# Patient Record
Sex: Female | Born: 1940 | Race: White | Hispanic: No | Smoking: Former smoker
Health system: Southern US, Community
[De-identification: ages and names within clinical notes are randomized; demographics above are authoritative.]

## PROBLEM LIST (undated history)

## (undated) DIAGNOSIS — B029 Zoster without complications: Secondary | ICD-10-CM

## (undated) DIAGNOSIS — Z0000 Encounter for general adult medical examination without abnormal findings: Secondary | ICD-10-CM

## (undated) DIAGNOSIS — N8110 Cystocele, unspecified: Secondary | ICD-10-CM

## (undated) DIAGNOSIS — B019 Varicella without complication: Secondary | ICD-10-CM

## (undated) DIAGNOSIS — Z974 Presence of external hearing-aid: Secondary | ICD-10-CM

## (undated) DIAGNOSIS — E785 Hyperlipidemia, unspecified: Secondary | ICD-10-CM

## (undated) DIAGNOSIS — M1990 Unspecified osteoarthritis, unspecified site: Secondary | ICD-10-CM

## (undated) DIAGNOSIS — R600 Localized edema: Secondary | ICD-10-CM

## (undated) DIAGNOSIS — I10 Essential (primary) hypertension: Secondary | ICD-10-CM

## (undated) DIAGNOSIS — N760 Acute vaginitis: Secondary | ICD-10-CM

## (undated) DIAGNOSIS — J40 Bronchitis, not specified as acute or chronic: Secondary | ICD-10-CM

## (undated) DIAGNOSIS — Z8701 Personal history of pneumonia (recurrent): Secondary | ICD-10-CM

## (undated) DIAGNOSIS — R05 Cough: Secondary | ICD-10-CM

## (undated) HISTORY — DX: Varicella without complication: B01.9

## (undated) HISTORY — DX: Zoster without complications: B02.9

## (undated) HISTORY — DX: Encounter for general adult medical examination without abnormal findings: Z00.00

## (undated) HISTORY — DX: Presence of external hearing-aid: Z97.4

## (undated) HISTORY — DX: Unspecified osteoarthritis, unspecified site: M19.90

## (undated) HISTORY — DX: Bronchitis, not specified as acute or chronic: J40

## (undated) HISTORY — PX: TUBAL LIGATION: SHX77

## (undated) HISTORY — DX: Acute vaginitis: N76.0

## (undated) HISTORY — PX: OTHER SURGICAL HISTORY: SHX169

## (undated) HISTORY — DX: Localized edema: R60.0

## (undated) HISTORY — DX: Personal history of pneumonia (recurrent): Z87.01

## (undated) HISTORY — DX: Essential (primary) hypertension: I10

## (undated) HISTORY — DX: Hyperlipidemia, unspecified: E78.5

## (undated) HISTORY — DX: Cystocele, unspecified: N81.10

## (undated) HISTORY — DX: Cough: R05

## (undated) HISTORY — PX: BREAST BIOPSY: SHX20

---

## 2005-11-29 IMAGING — MG MM-DIGITAL SCREENING MAMMO
1 series · 4 of 4 positions shown · non-contrast
Comparison: none

EXAM: CERVICAL SPINE

IN-PAIN Comment to Radiology:....
No acute fracture is seen. There is narrowing of the C3-C4, C4-C5 and
C5-C6 cervical disk spaces compatible with cervical disk disease.
Oblique view shows slight spur impingement on the neural foramina at
these levels. The odontoid process is intact. No cervical rib formation
is seen.

[L CC · left · 4 of 4 slices shown]
[im 1/4]
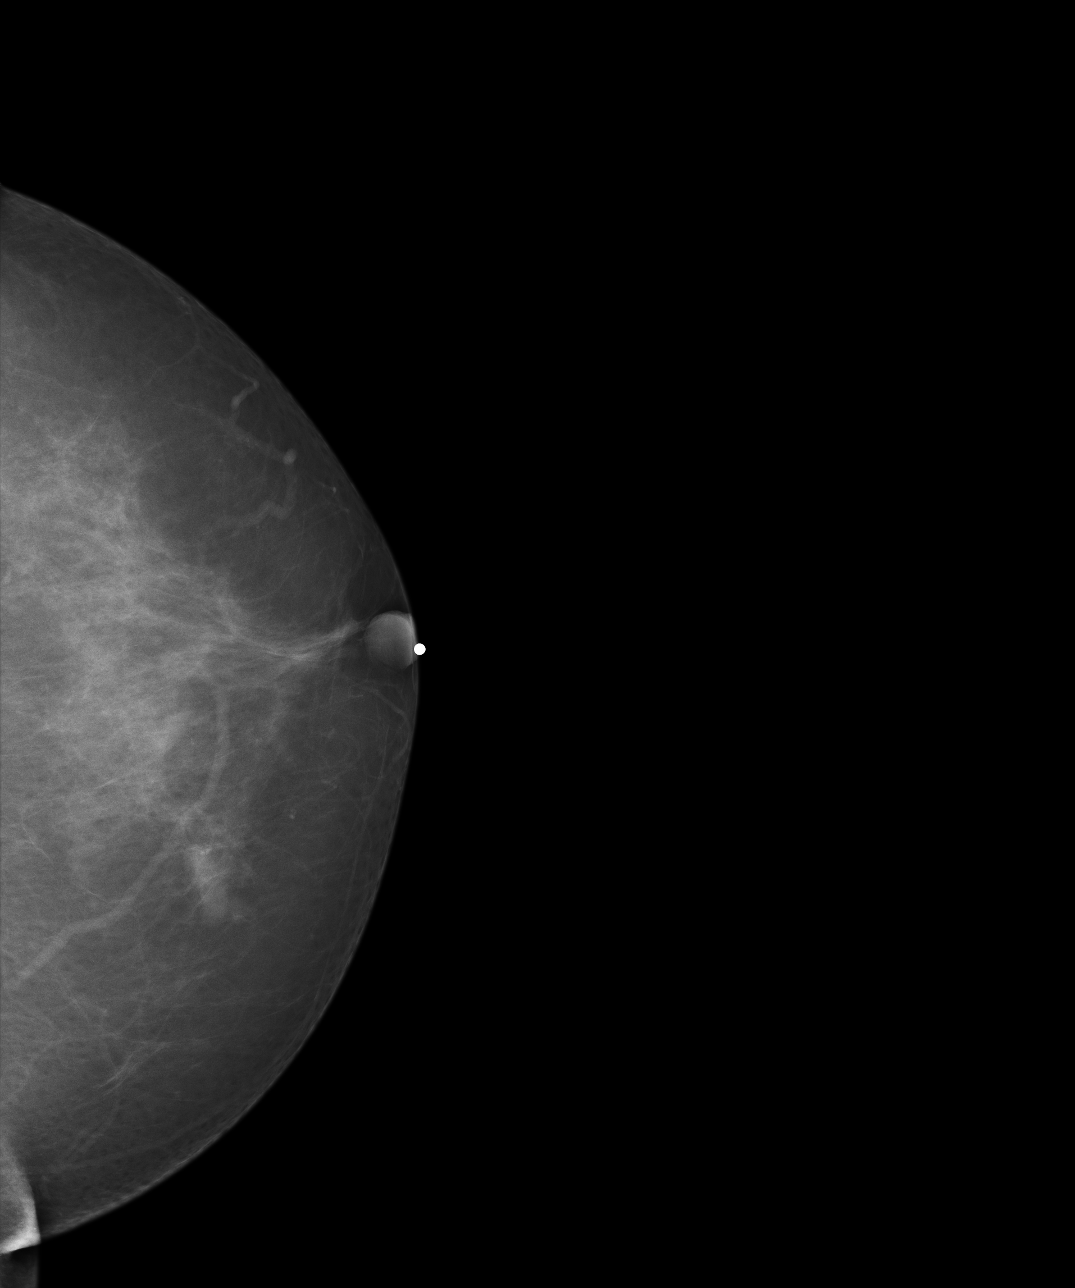
[im 2/4]
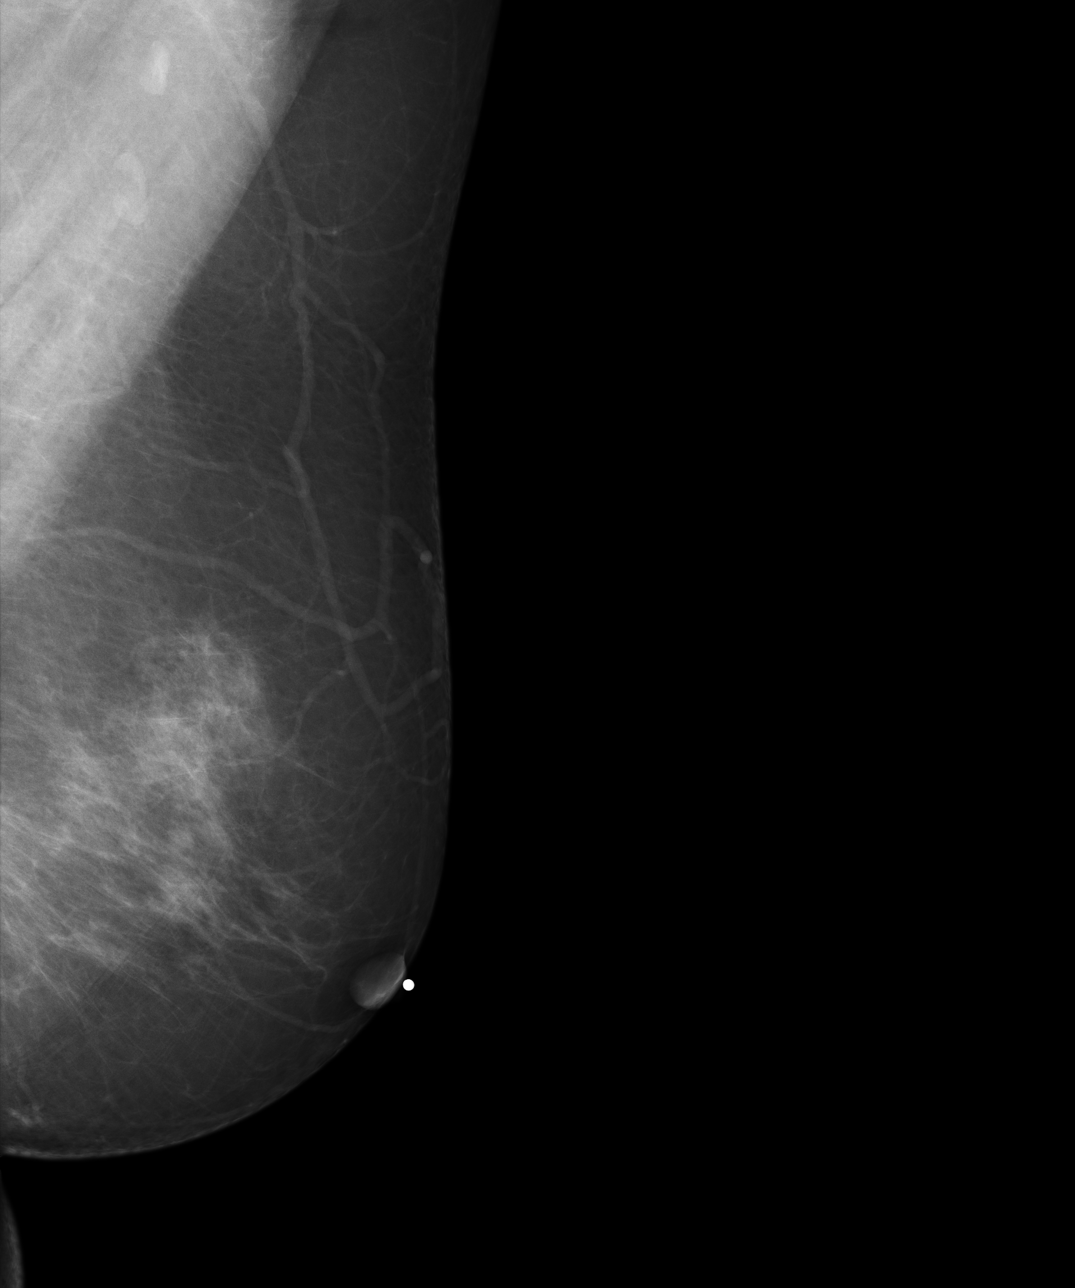
[im 3/4]
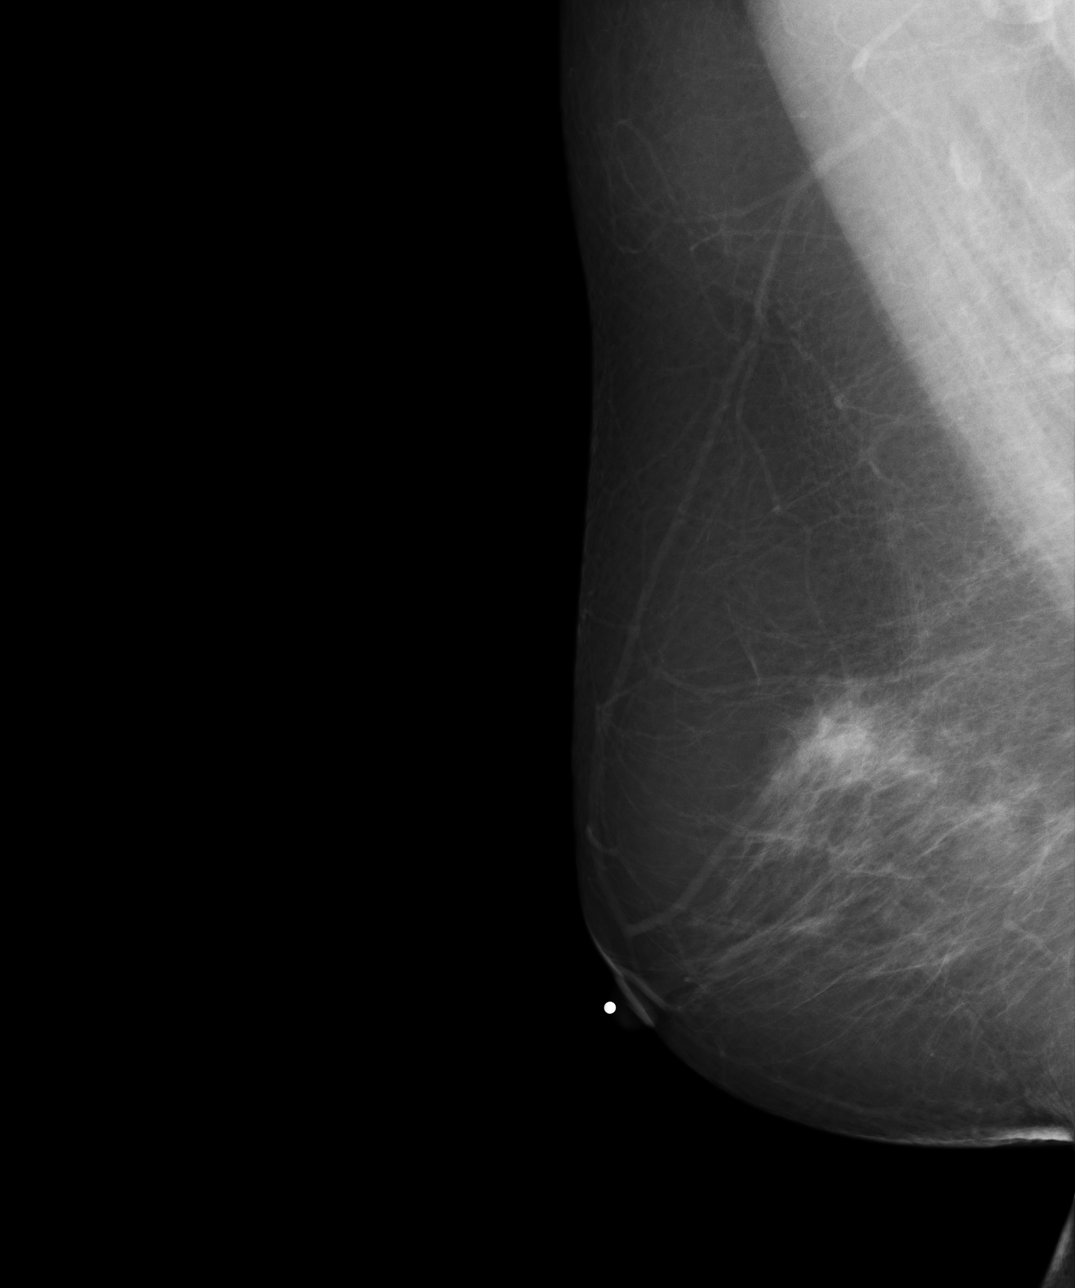
[im 4/4]
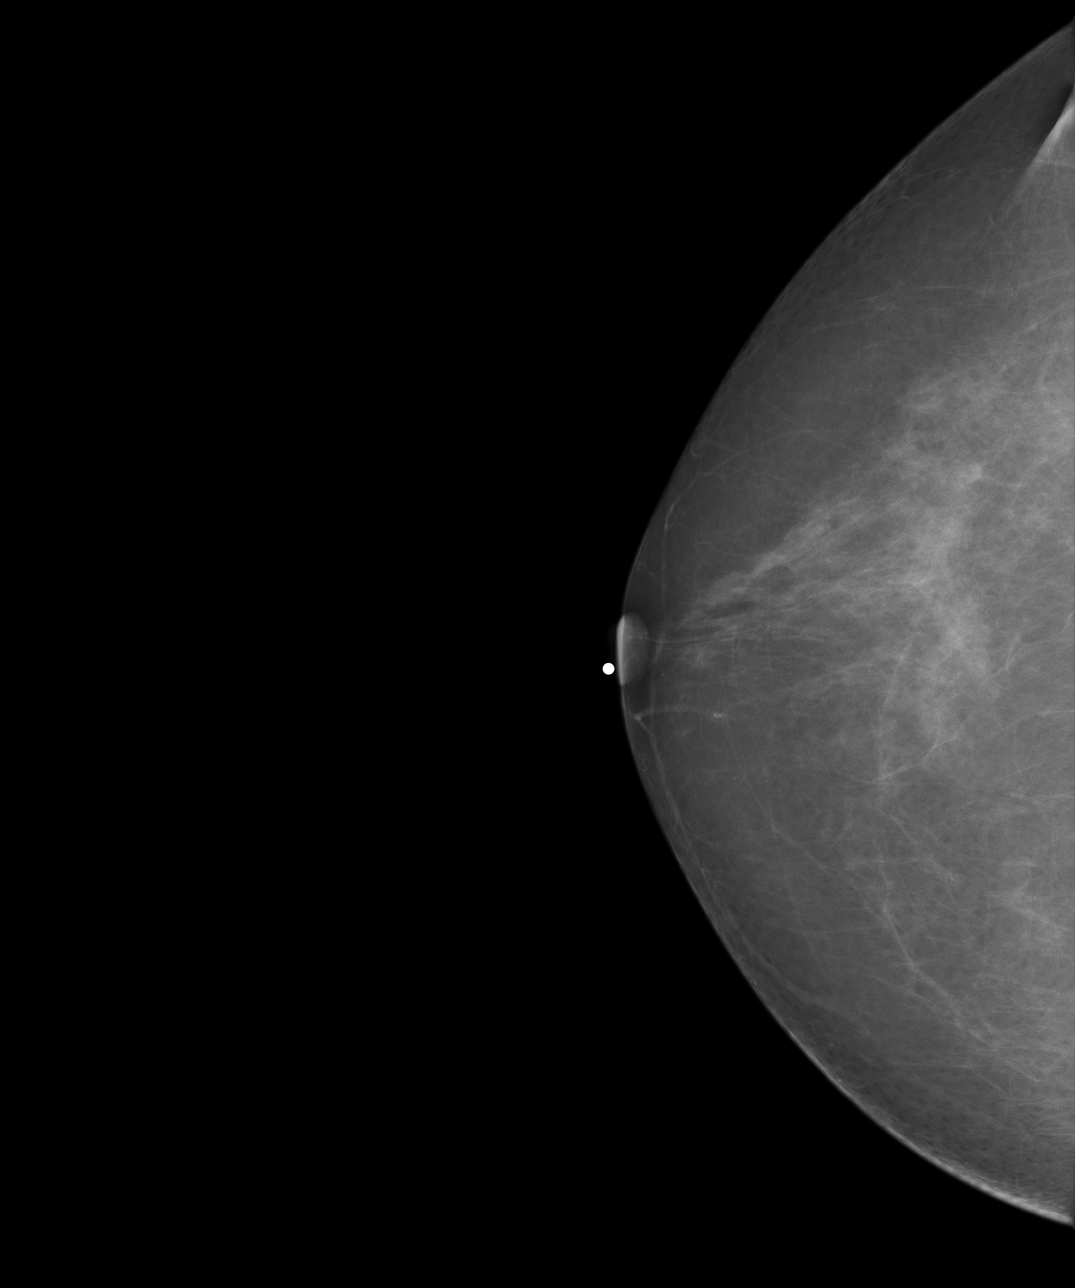

[4 of 4 positions shown; findings below may reference images not displayed]

CONCLUSION: 1. No fracture is identified.
2. There are changes of cervical disk disease at multiple levels as
 described above.

## 2005-12-09 IMAGING — MG MM-DIGITAL DIAG MAMMO UNILAT LEFT
1 series · 3 of 3 positions shown · non-contrast
Comparison: none

NAME: MOOLMAN

EXAM: CHEST ROUTINE
REASON: Last Menstrual Date:..... POST Reason for Consultation:.
CHOKED Comment to Radiology:....

[L LM · left · 3 of 3 slices shown]
[im 1/3]
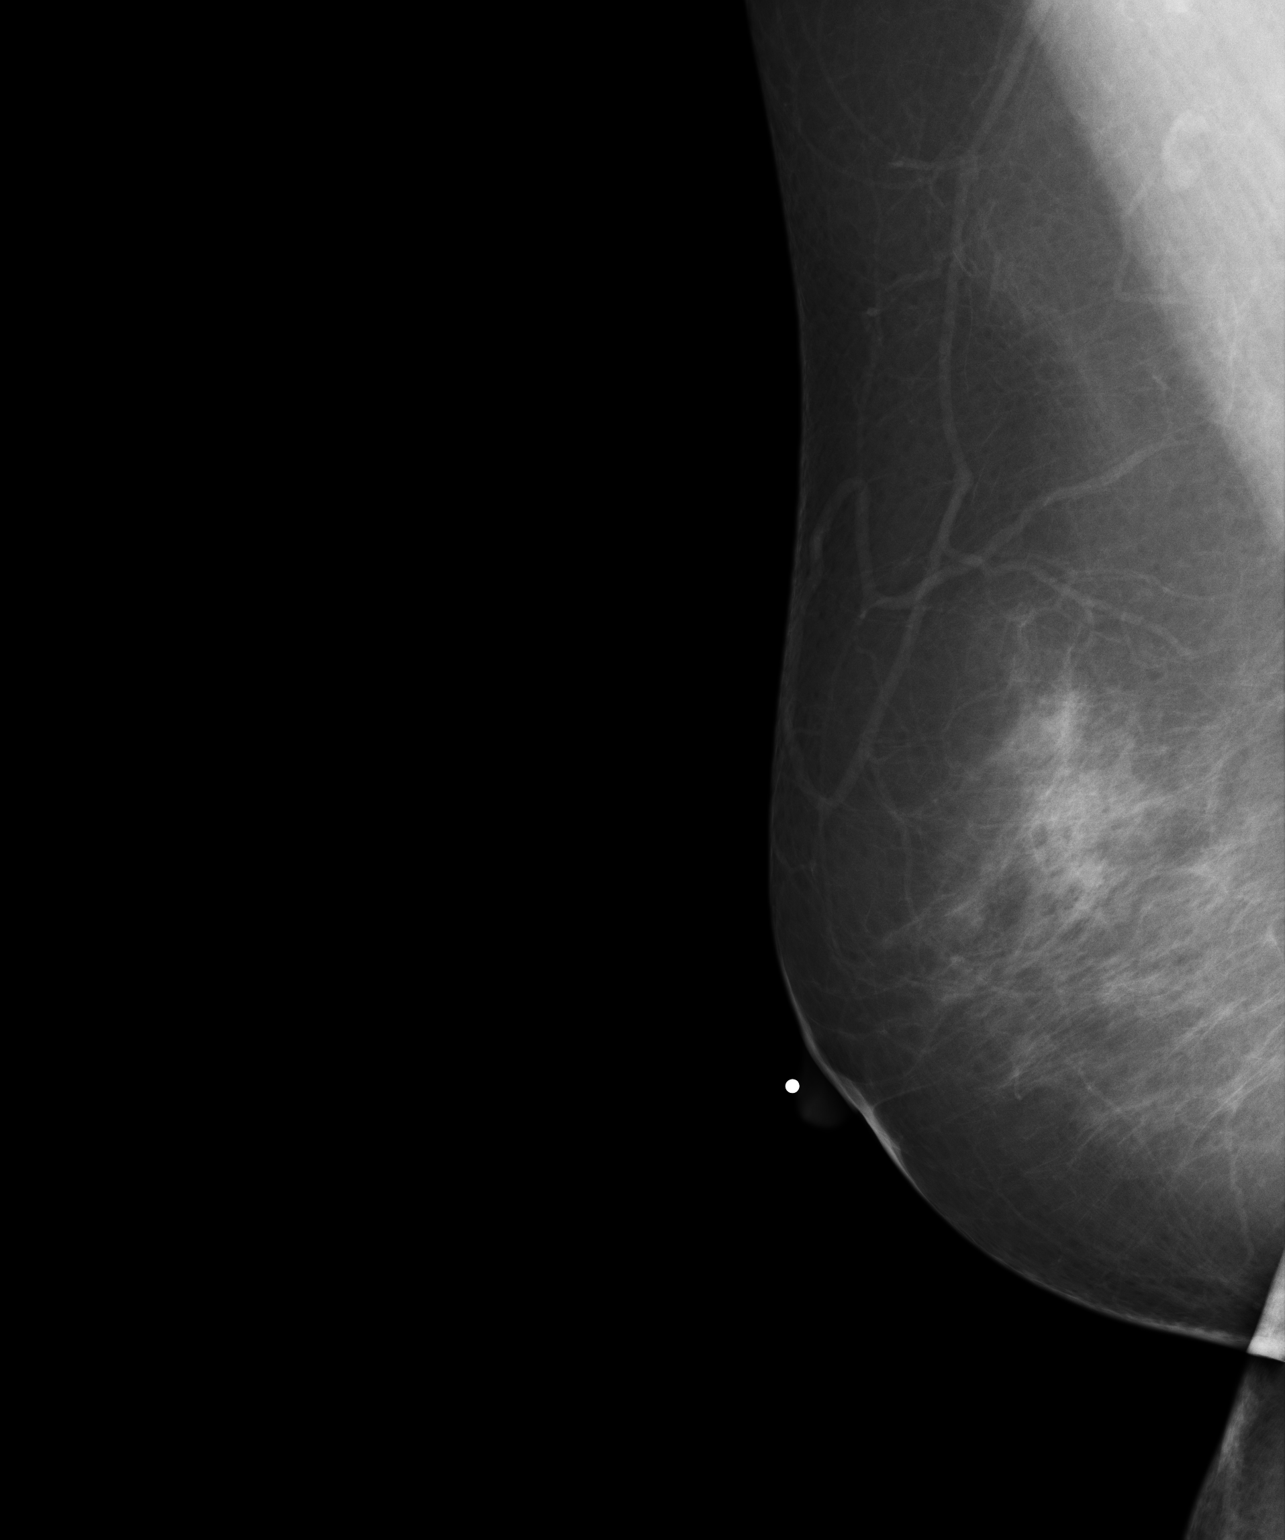
[im 2/3]
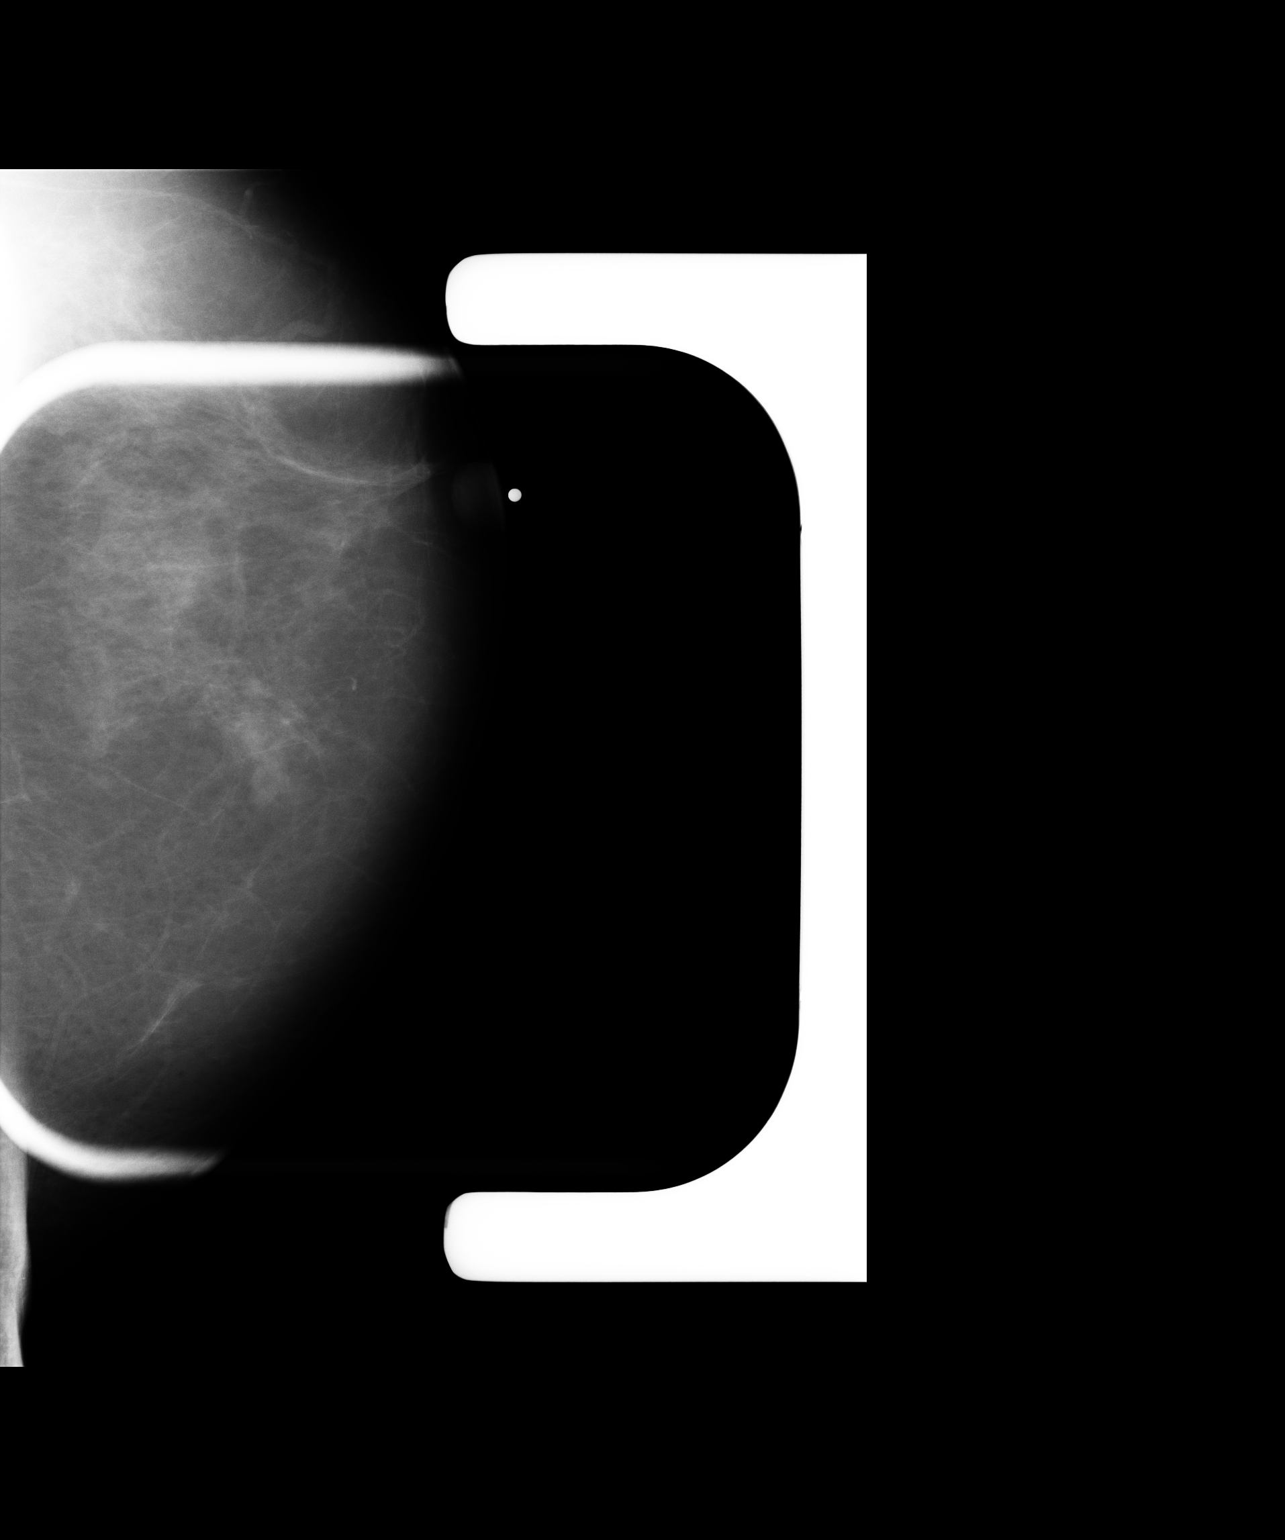
[im 3/3]
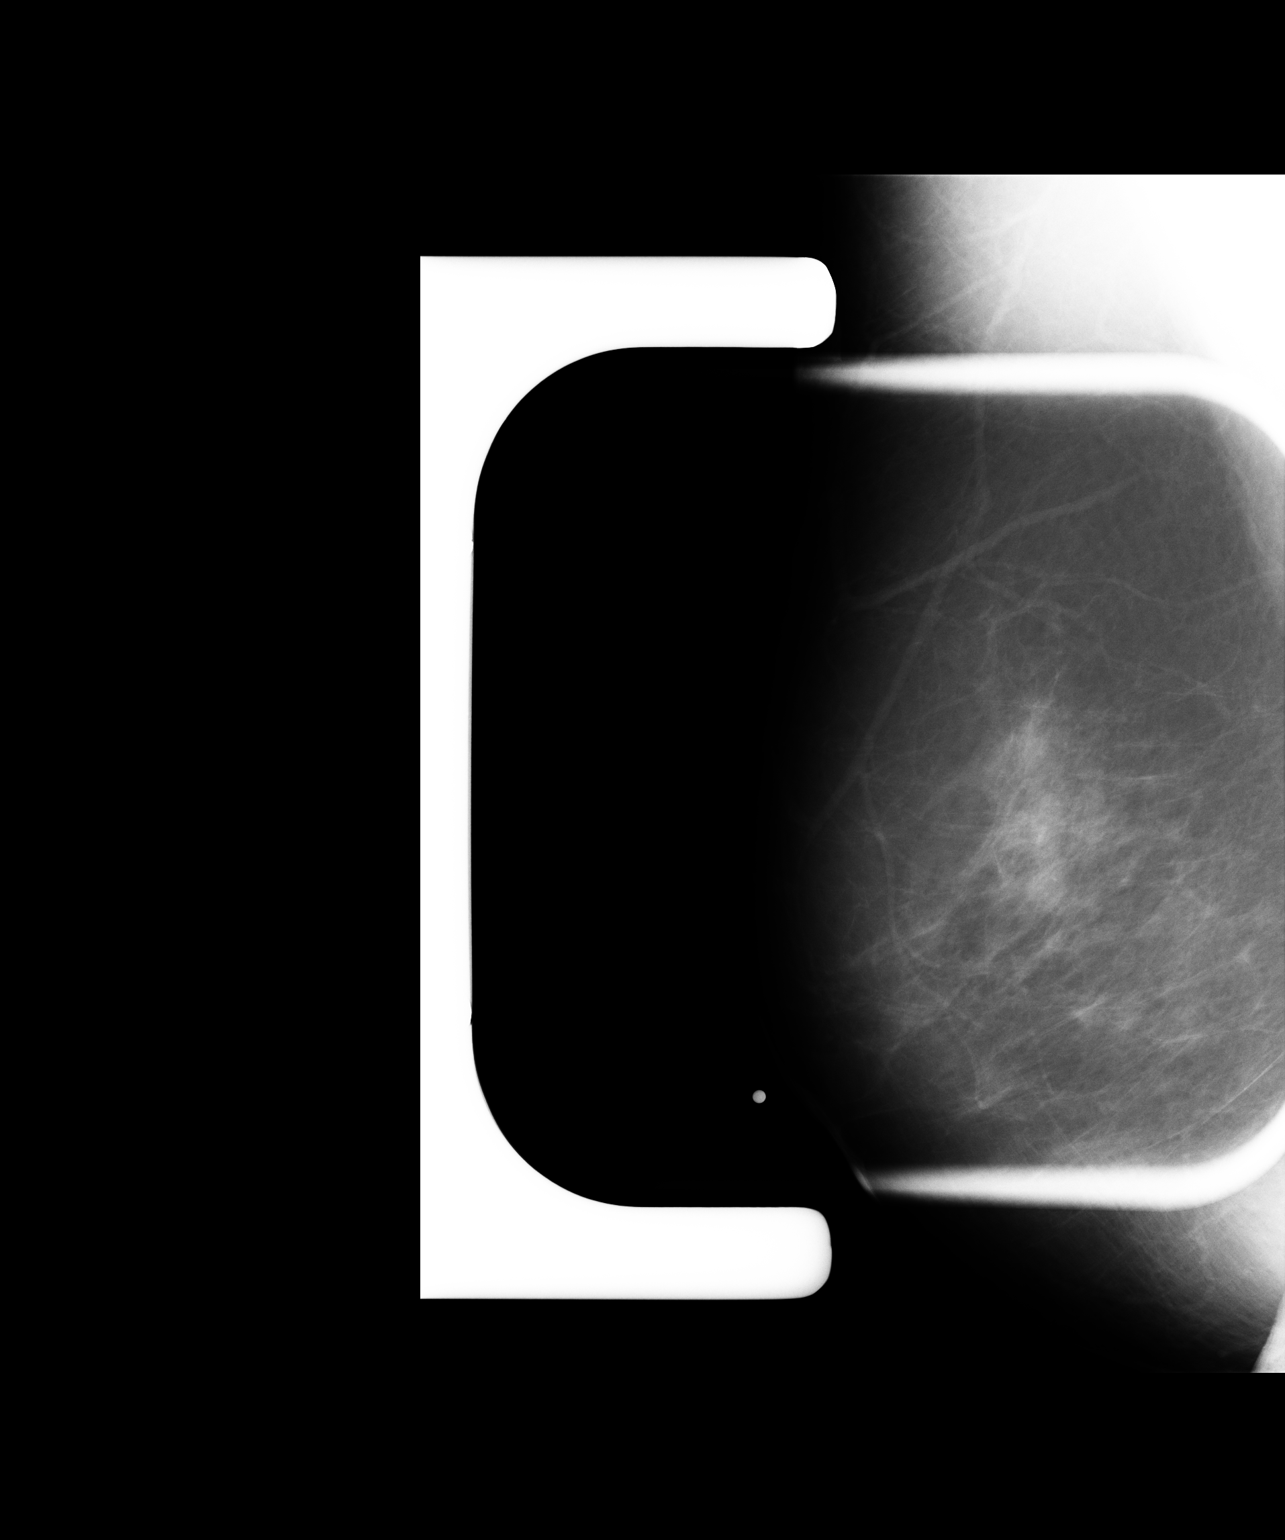

[3 of 3 positions shown; findings below may reference images not displayed]

FINDINGS: Comparison is made to a portable study of the chest on [DATE].

The heart and pulmonary vessels appear to be within normal limits.
There is no infiltrate or effusion. There is some minimal thickening of
the major fissure on the lateral view. No pneumothorax is present.
IMPRESSION: No acute cardiopulmonary disease. No interval change.

## 2006-12-01 IMAGING — MG MM-DIGITAL SCREENING MAMMO
1 series · 4 of 4 positions shown · non-contrast
Comparison: none

EXAM: SCREENING MAMMOGRAM BILAT
VER. RAD:PAC[DATE] [DATE]<

REASON: KCW/MAMMO....HM 226 [NP]
HISTORY: Routine, previous breast biopsy history.

[Series 569: L CC · left · 4 of 4 slices shown]
[im 1/4]
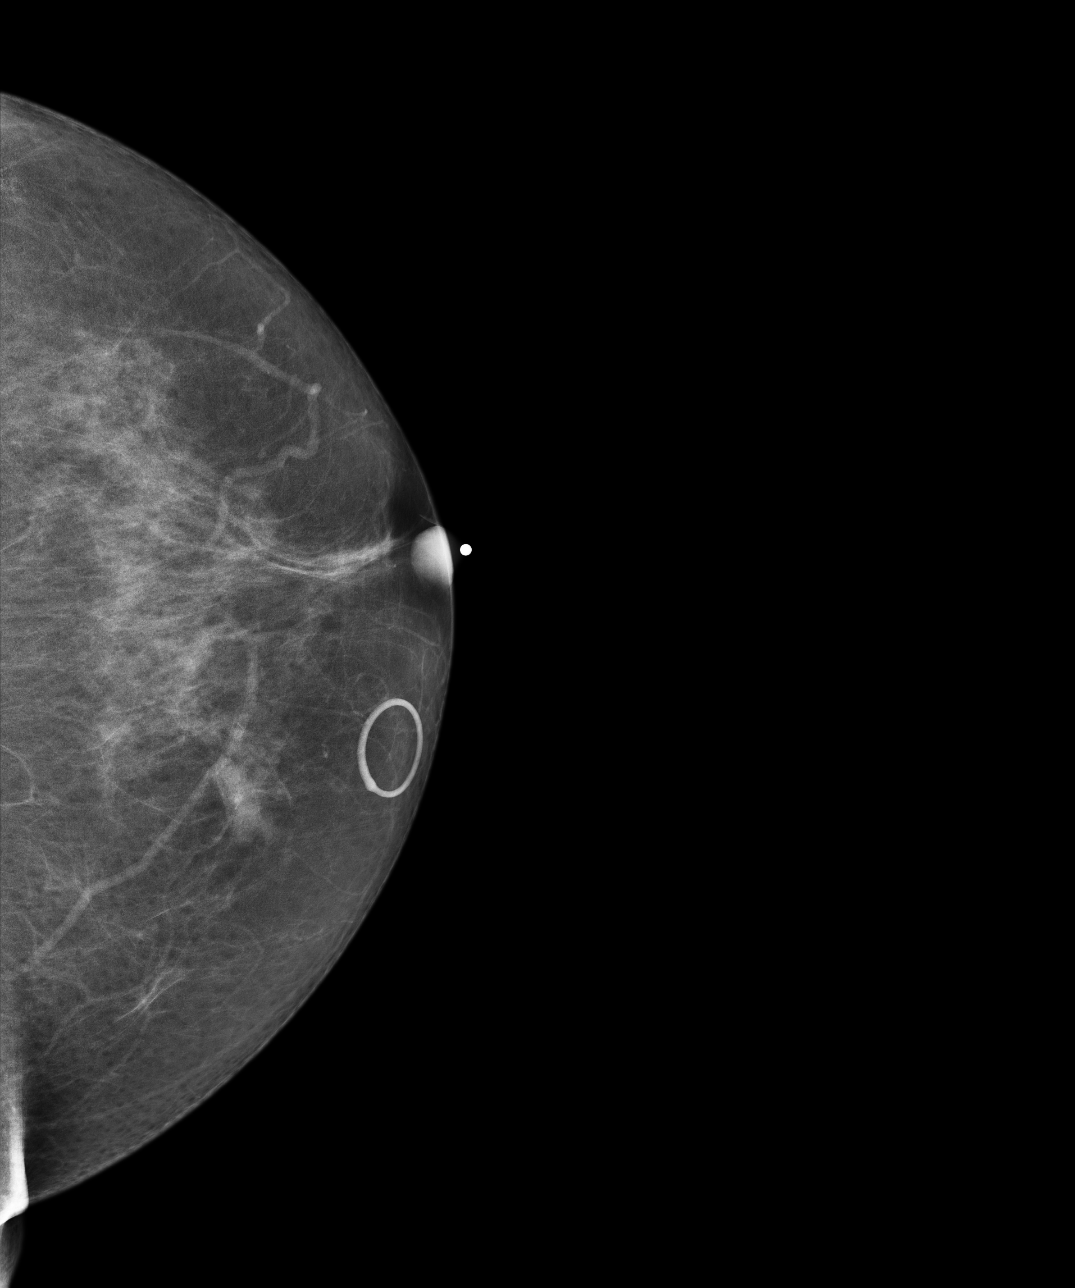
[im 2/4]
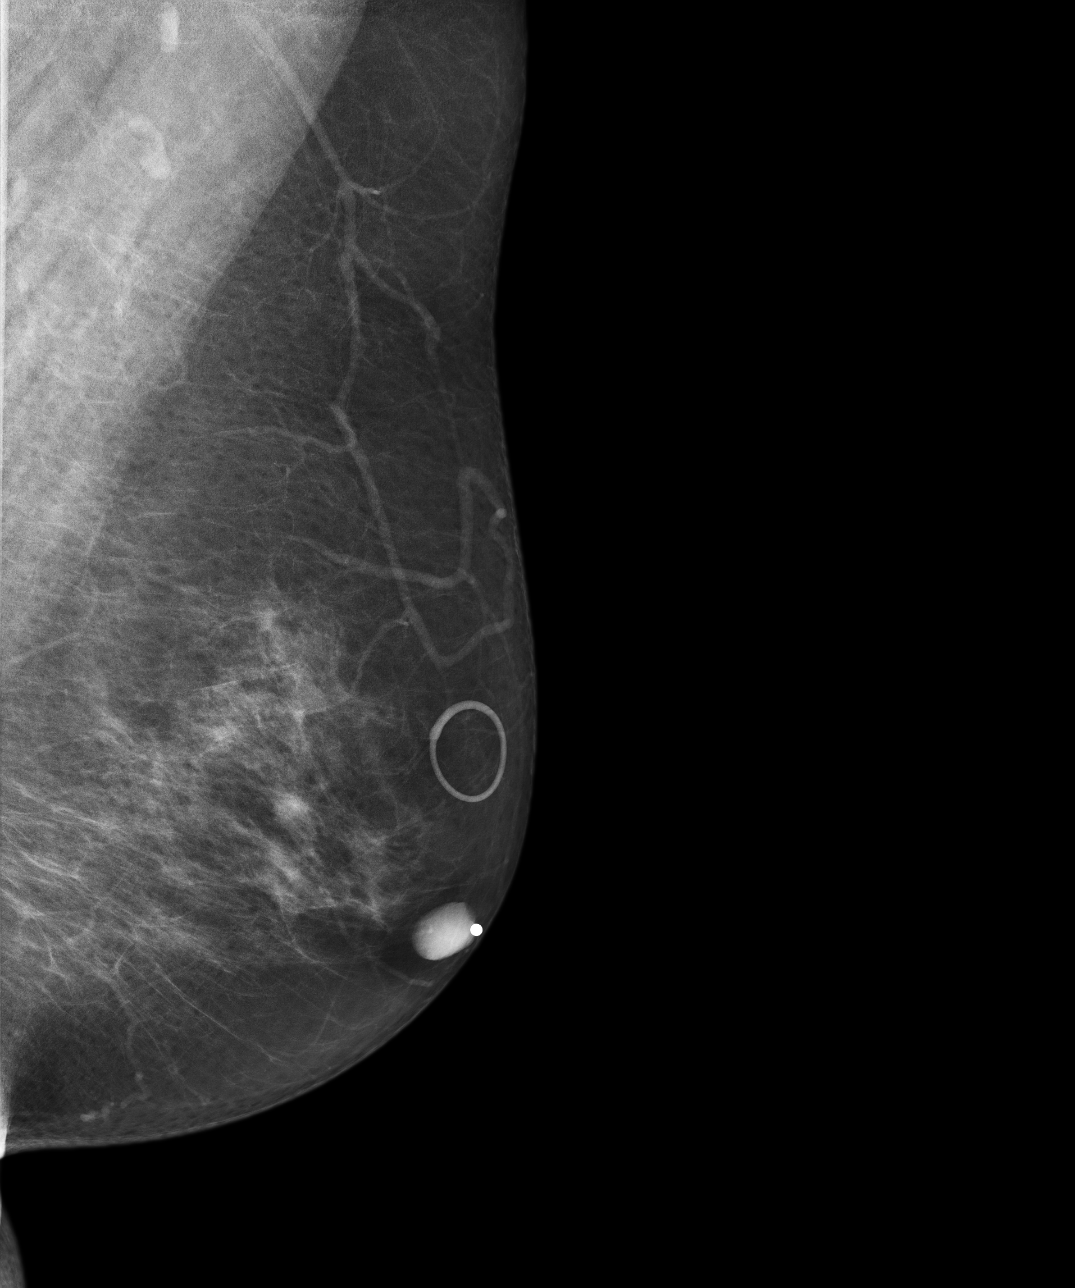
[im 3/4]
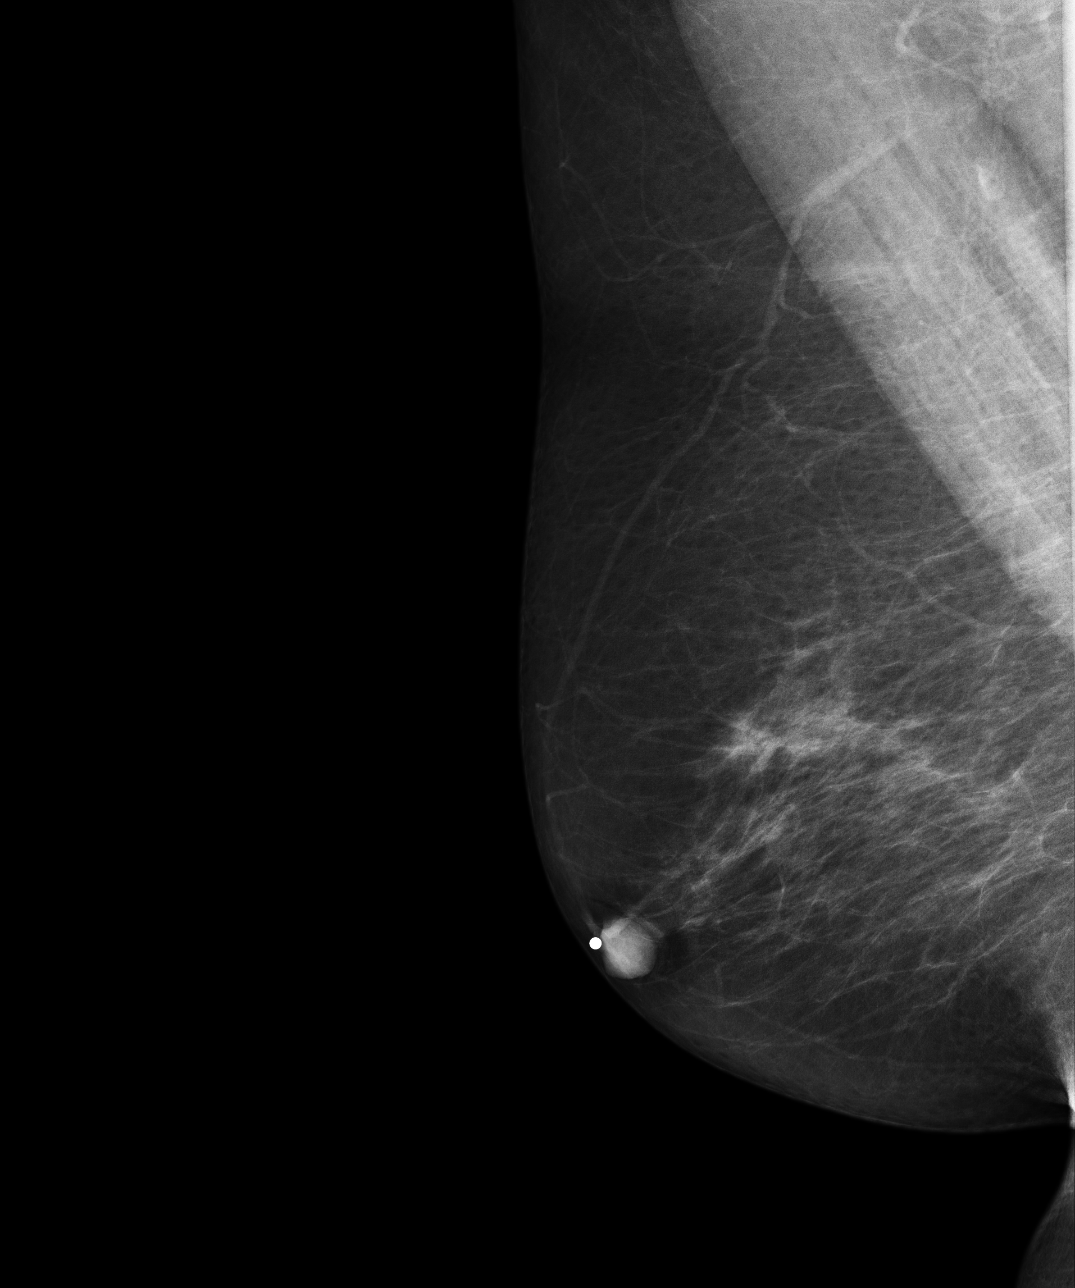
[im 4/4]
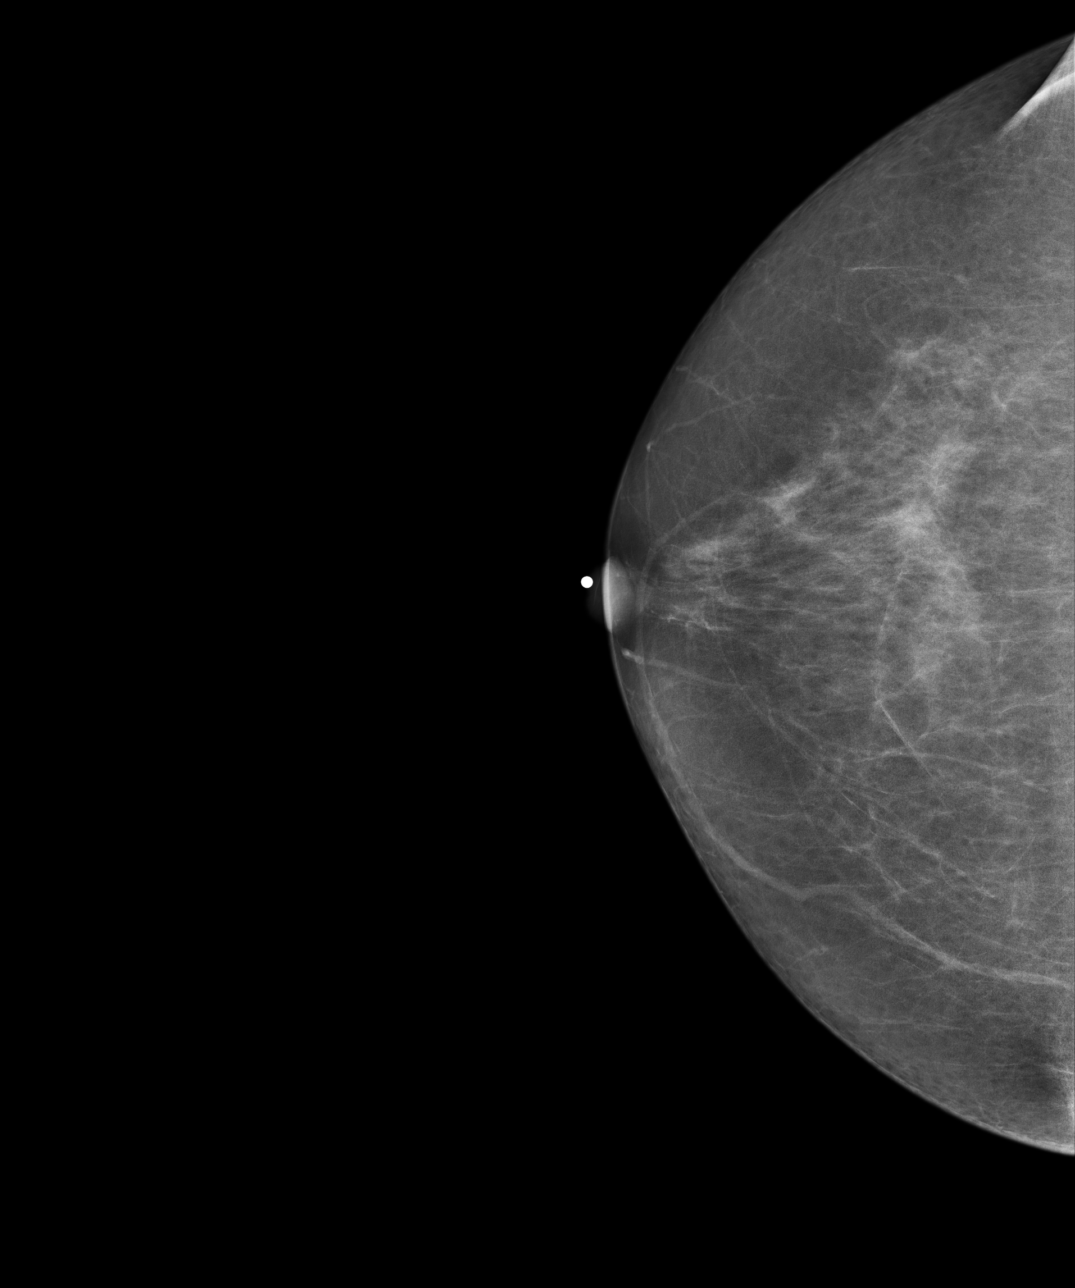

[4 of 4 positions shown; findings below may reference images not displayed]

FINDINGS: Comparison is made to studies [DATE] and [DATE]. The breasts again demonstrate a dense nodular fibroglandular
architecture without dominant or spiculated mass, suspicious
calcification or architectural distortion. There is no skin thickening
or nipple retraction. Stable punctate benign appearing calcifications
are noted.
IMPRESSION: Stable dense breasts limiting the sensitivity of the study without
radiographic evidence of malignancy.

<pp>

Routine bilateral screening is advised in one year.

BI-RADS: Category 2-Benign Finding

A NEGATIVE MAMMOGRAM REPORT DOES NOT PRECLUDE BIOPSY OR OTHER EVALUATION
OF A CLINICALLY PALPABLE OR OTHERWISE SUSPICIOUS MASS OR LESION.

## 2007-12-05 ENCOUNTER — Encounter: Admission: RE | Admit: 2007-12-05 | Discharge: 2007-12-05 | Payer: Self-pay | Admitting: Family Medicine

## 2009-01-02 ENCOUNTER — Encounter: Admission: RE | Admit: 2009-01-02 | Discharge: 2009-01-02 | Payer: Self-pay | Admitting: Family Medicine

## 2009-01-02 IMAGING — CR DG SHOULDER 2+V*L*
3 series · 3 of 3 positions shown · non-contrast
Comparison: None

CLINICAL DATA: Acute left shoulder pain without trauma.  Pain
increases with internal rotation and raising arm.

LEFT SHOULDER - 2+ VIEW

[view not recorded (1 of 3)]
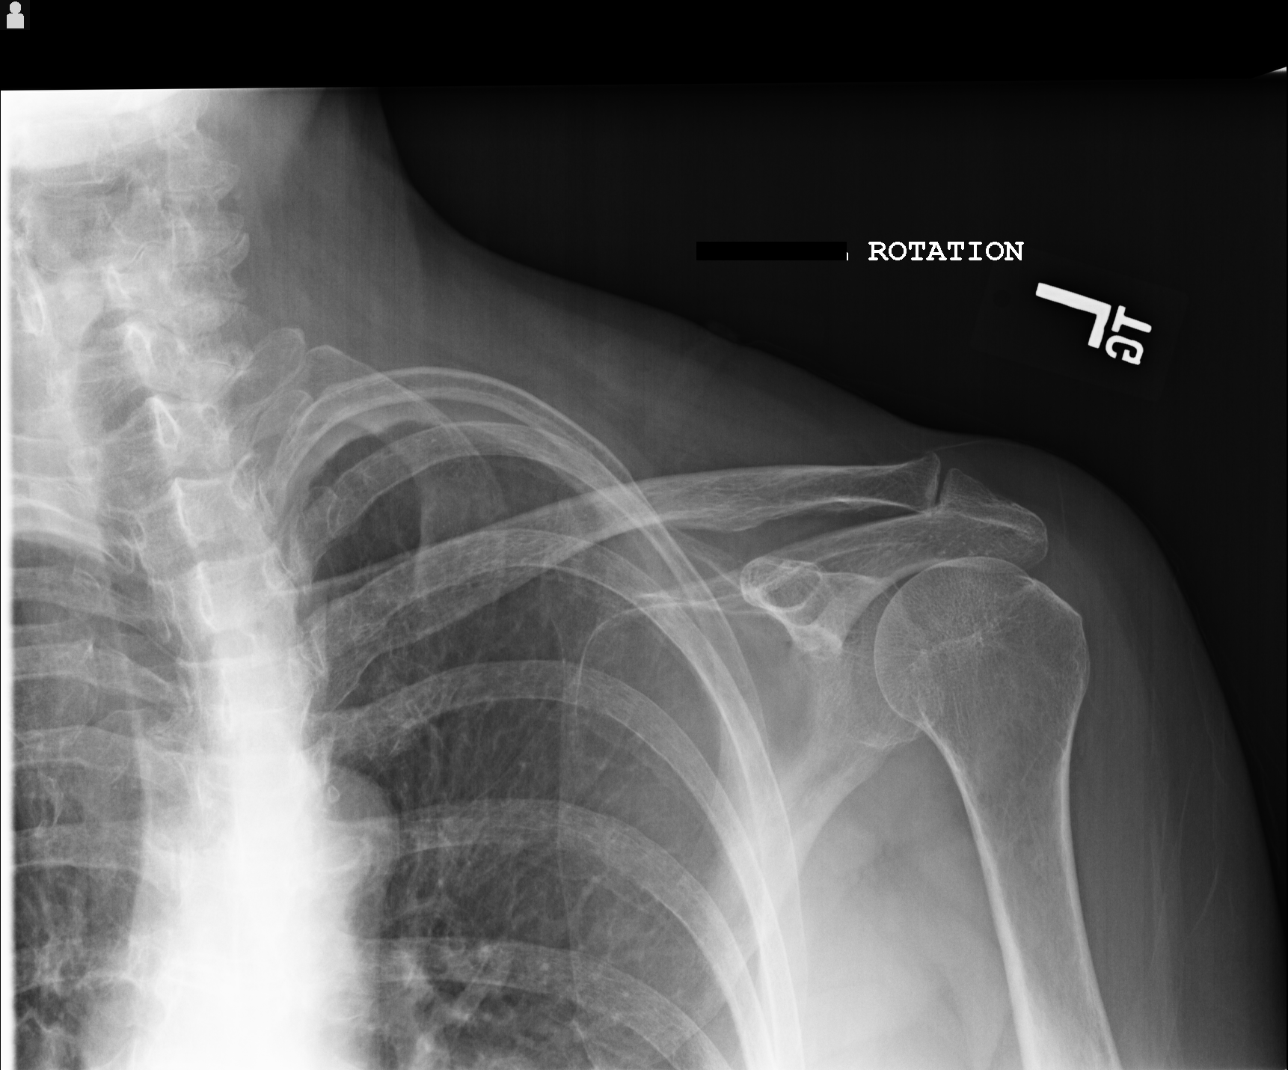

[view not recorded (2 of 3)]
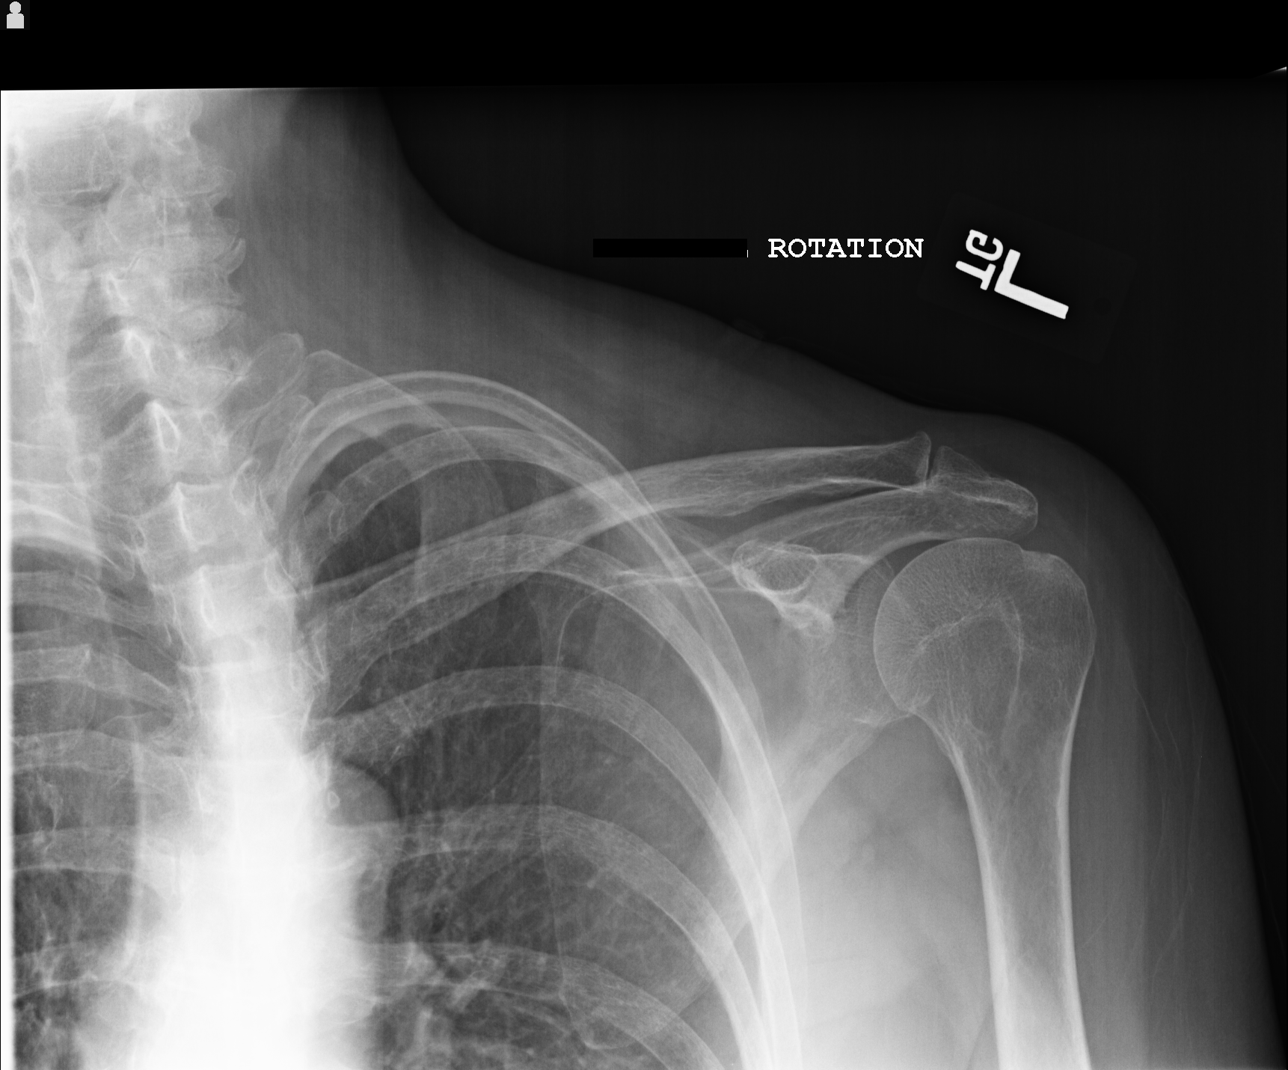

[view not recorded (3 of 3)]
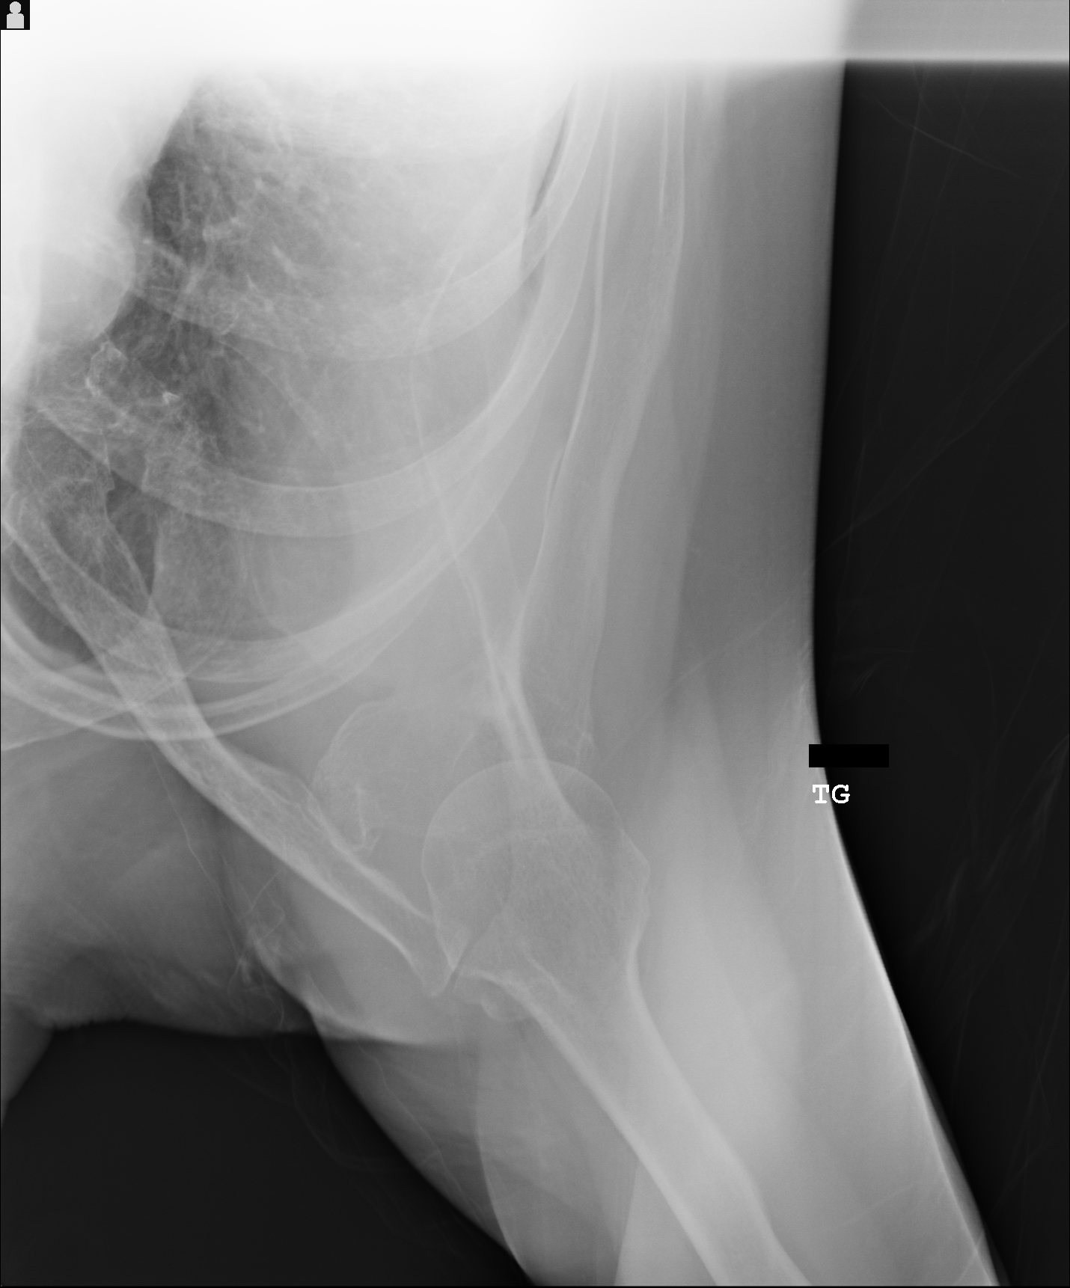

[3 of 3 positions shown; findings below may reference images not displayed]

FINDINGS: Slight degenerative joint disease left acromioclavicular
joint noted.  No other significant osseous, articular or soft
tissue abnormality noted at the left shoulder.  Mid left cervical
spine facet degenerative joint disease noted with partially imaged
probable slight kyphoscoliosis superior dorsal spine.
IMPRESSION: 1.  Slight degenerative joint disease left acromioclavicular joint
with otherwise radiographically negative left shoulder.
2.  Mid left cervical facet degenerative joint disease with
partially imaged probable slight kyphoscoliosis superior dorsal
spine.
3.  No acute findings.

## 2009-01-23 ENCOUNTER — Encounter: Admission: RE | Admit: 2009-01-23 | Discharge: 2009-01-23 | Payer: Self-pay | Admitting: Family Medicine

## 2009-07-09 ENCOUNTER — Encounter: Admission: RE | Admit: 2009-07-09 | Discharge: 2009-07-09 | Payer: Self-pay | Admitting: Family Medicine

## 2009-07-09 IMAGING — CR DG CHEST 2V
2 series · 2 of 2 positions shown · non-contrast
Comparison: None.

CLINICAL DATA: Chronic cough.  Congestion and facial pain.

CHEST - 2 VIEW

[view not recorded (1 of 2)]
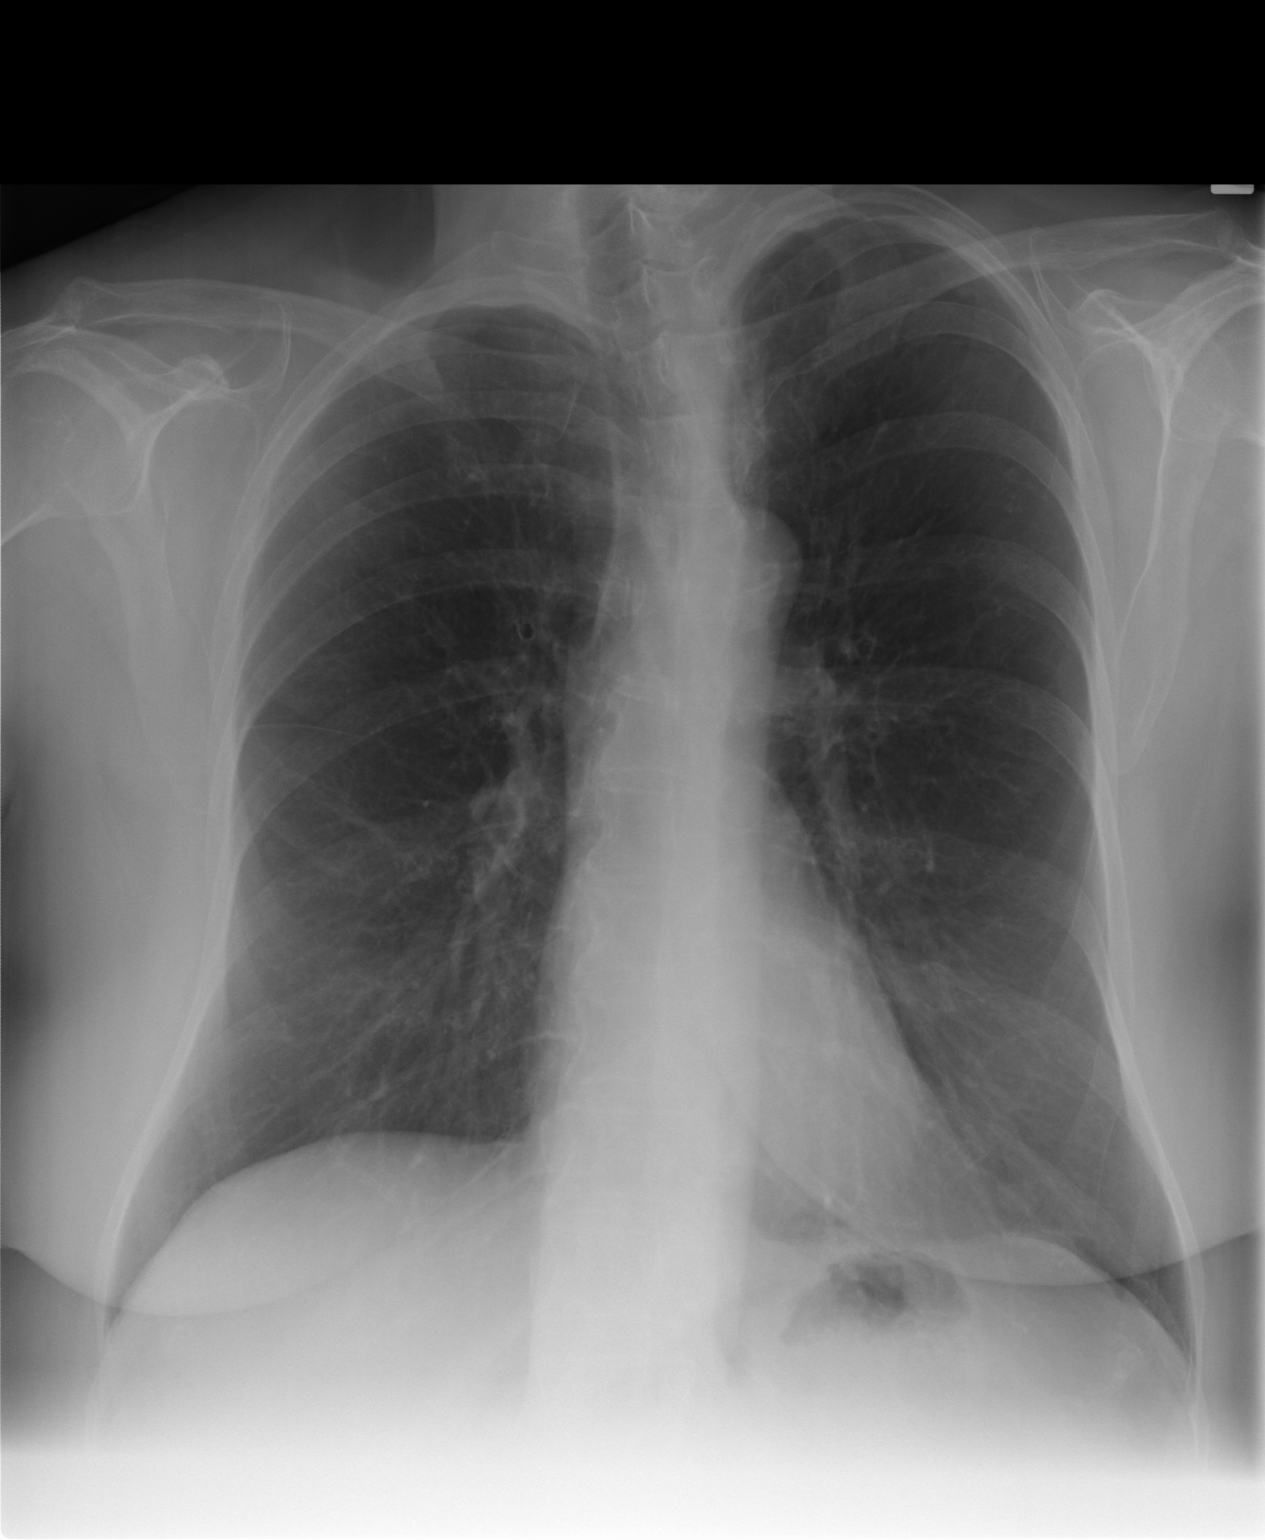

[view not recorded (2 of 2)]
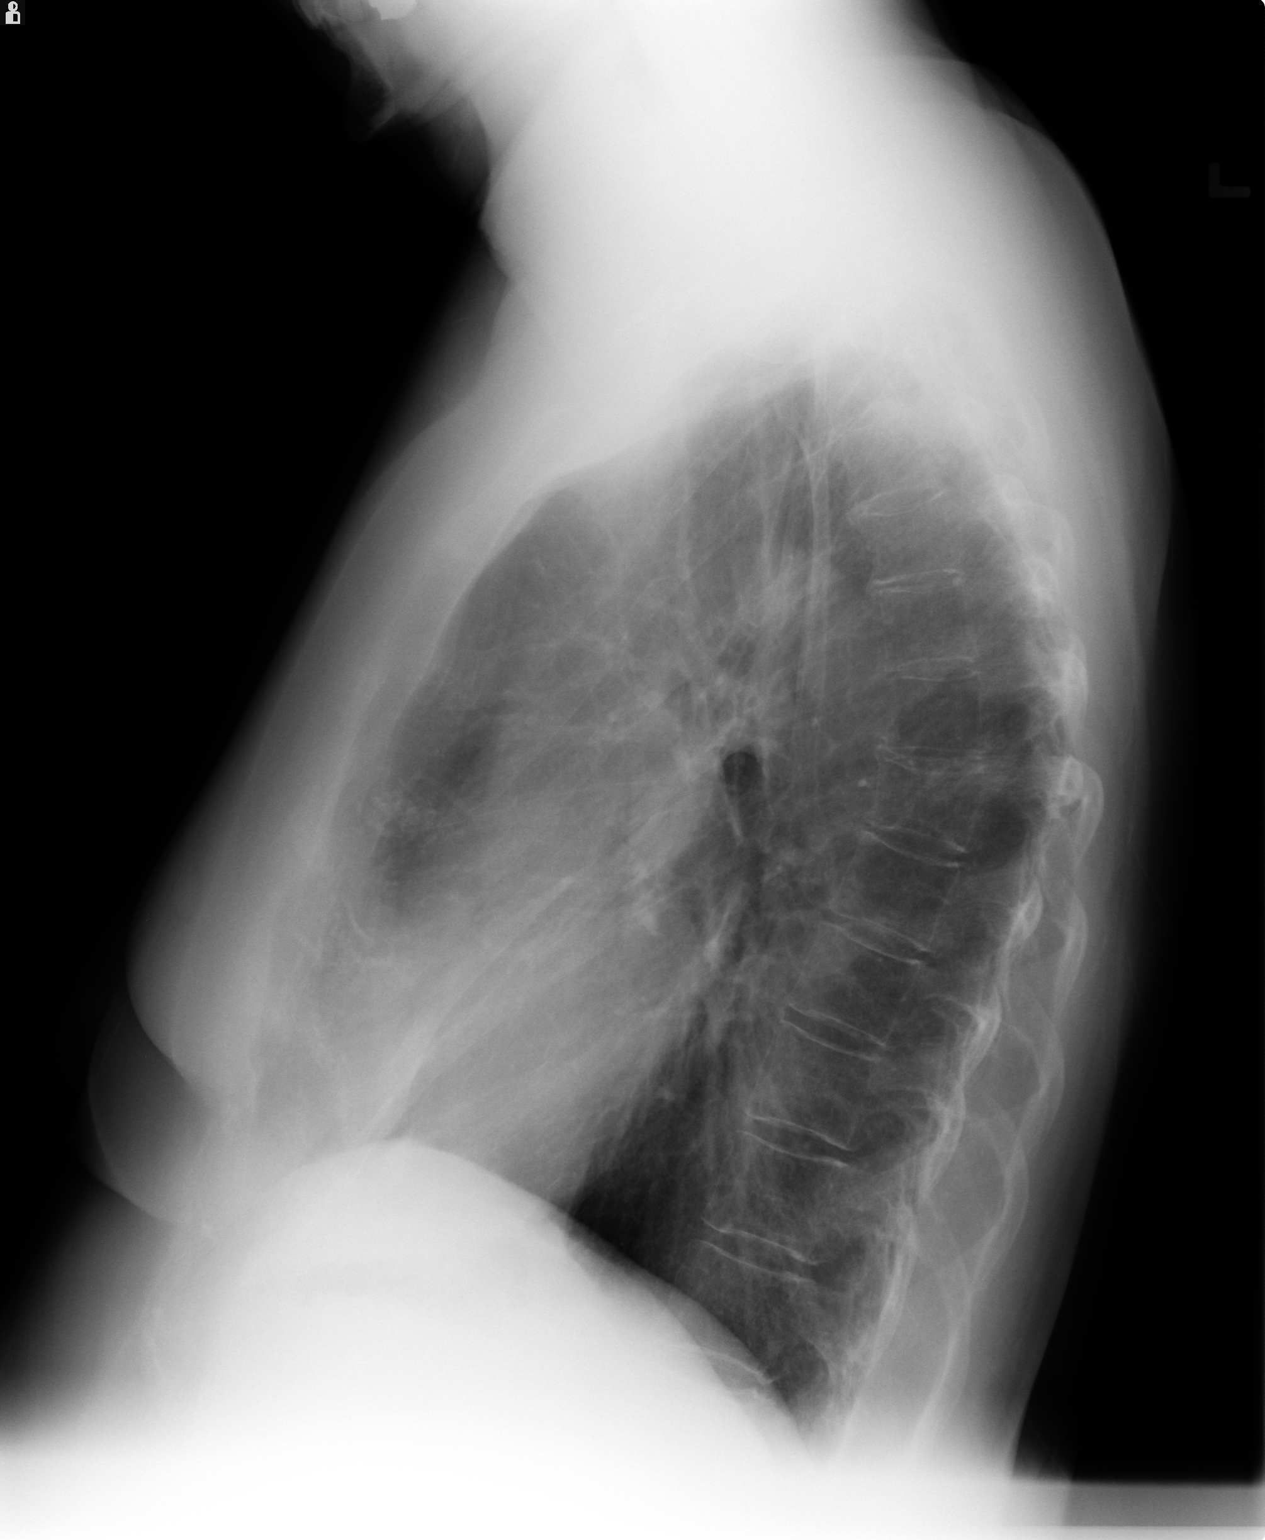

[2 of 2 positions shown; findings below may reference images not displayed]

FINDINGS: Trachea is midline.  Heart size normal.  Mild thickening
of the minor fissure.  Lungs are clear.  No pleural fluid.
IMPRESSION: No acute findings.

## 2009-07-09 IMAGING — CR DG SINUSES COMPLETE 3+V
4 series · 4 of 4 positions shown · non-contrast
Comparison: None

CLINICAL DATA: Chronic cough.  Facial pain.  Congestion.

PARANASAL SINUSES - COMPLETE 3 + VIEW

[view not recorded (1 of 4)]
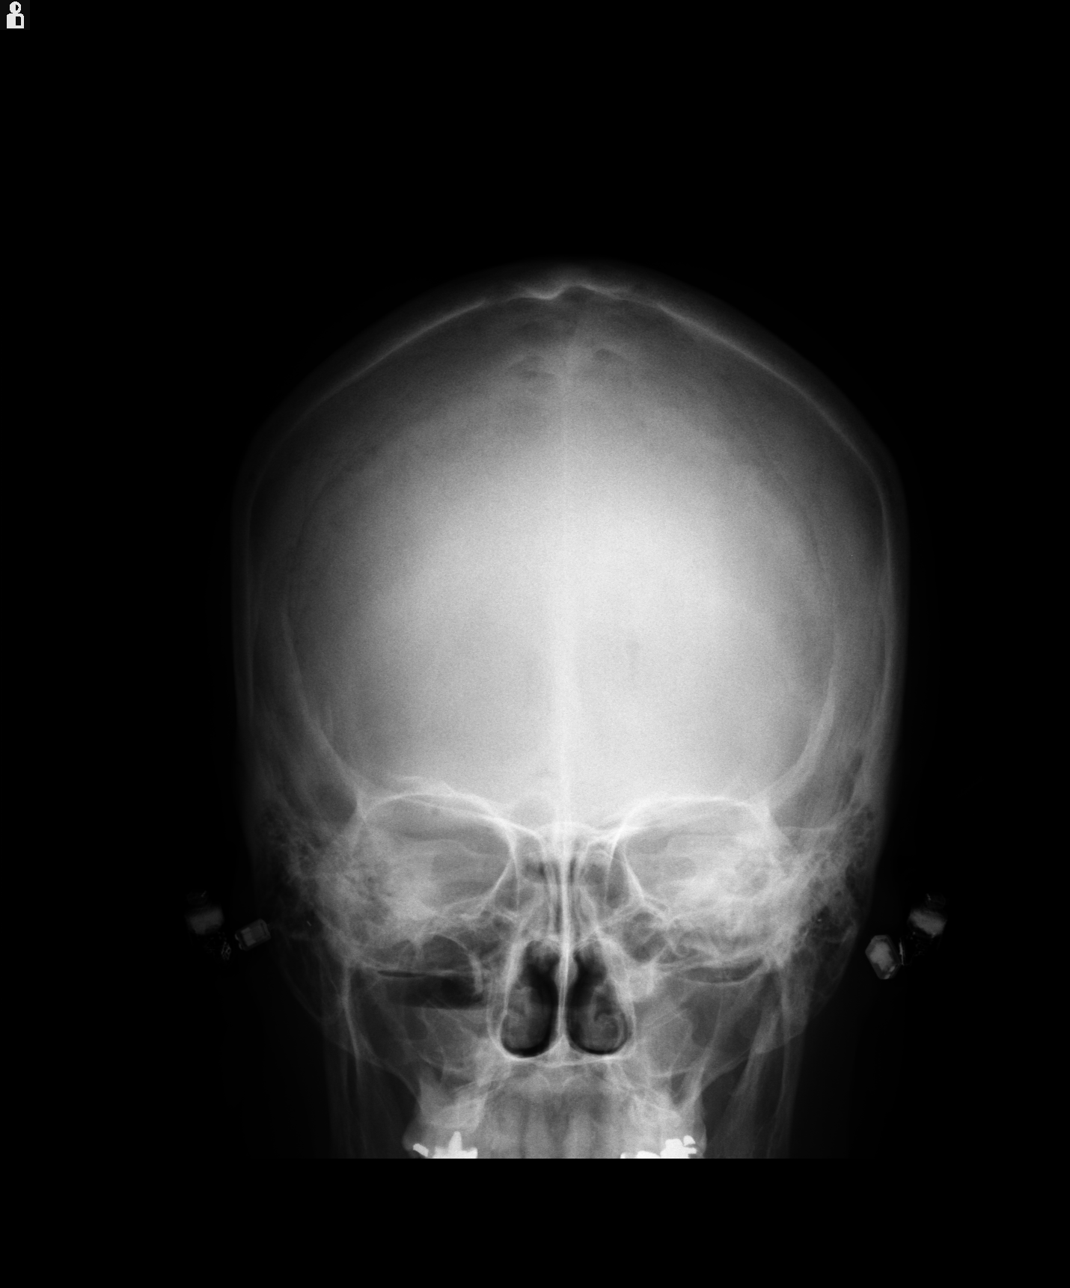

[view not recorded (2 of 4)]
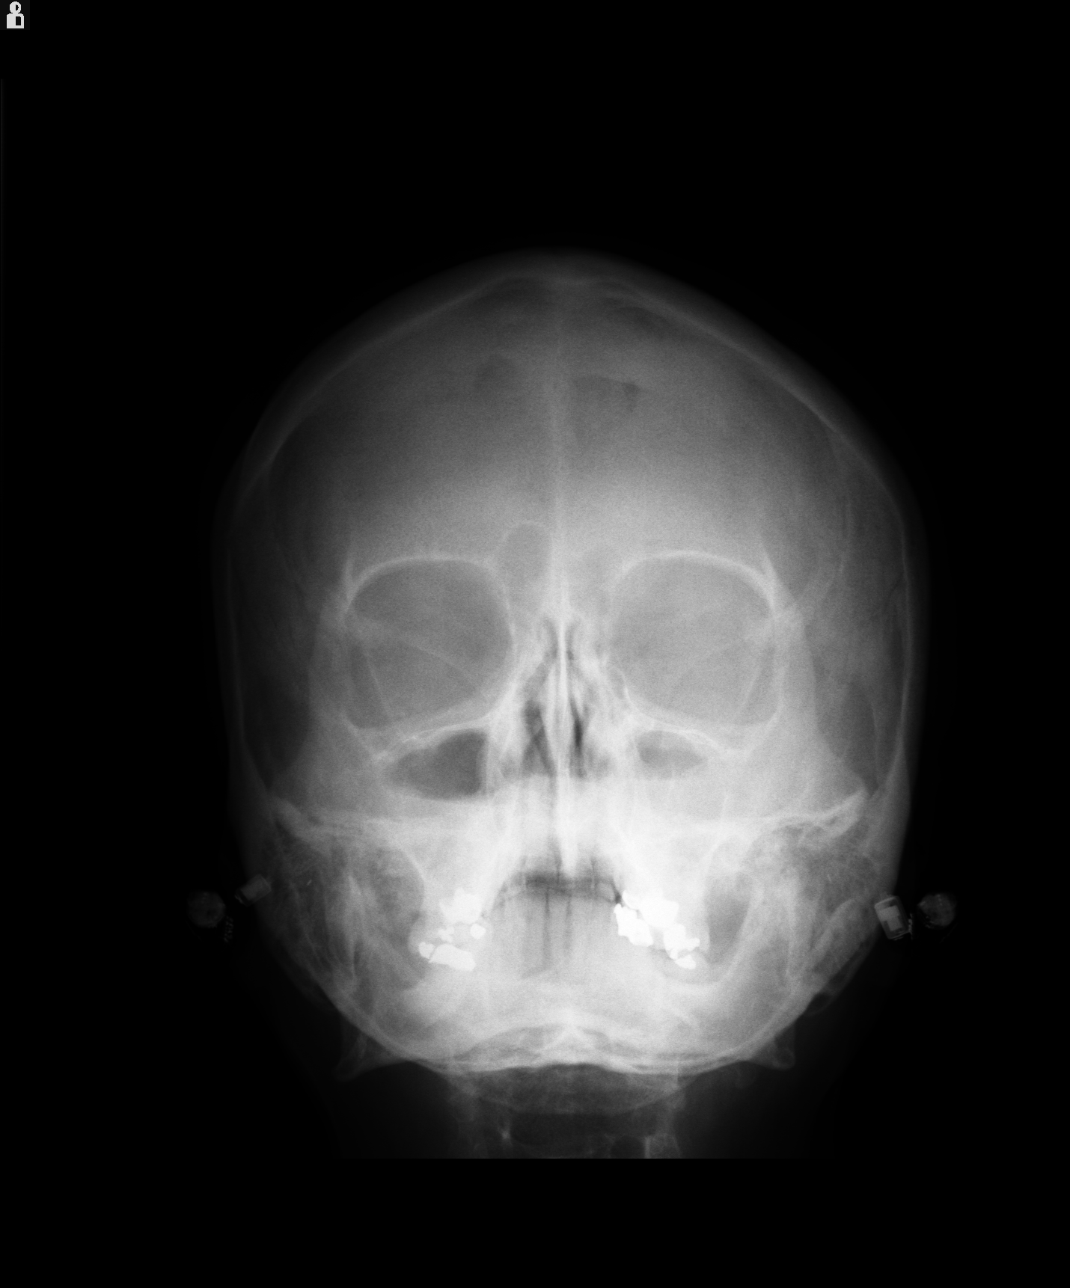

[view not recorded (3 of 4)]
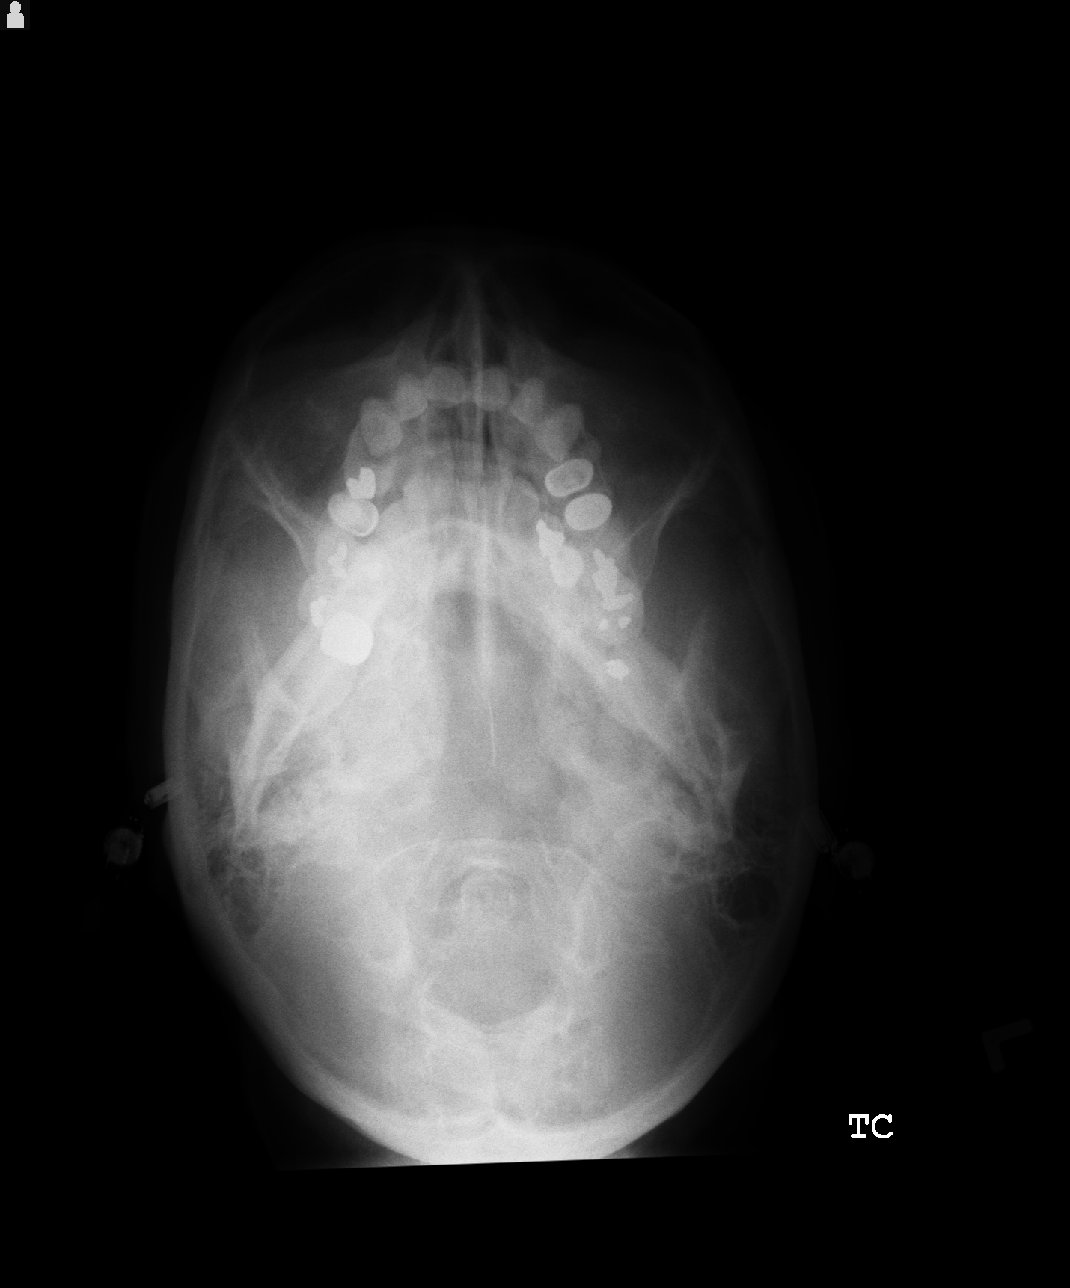

[view not recorded (4 of 4)]
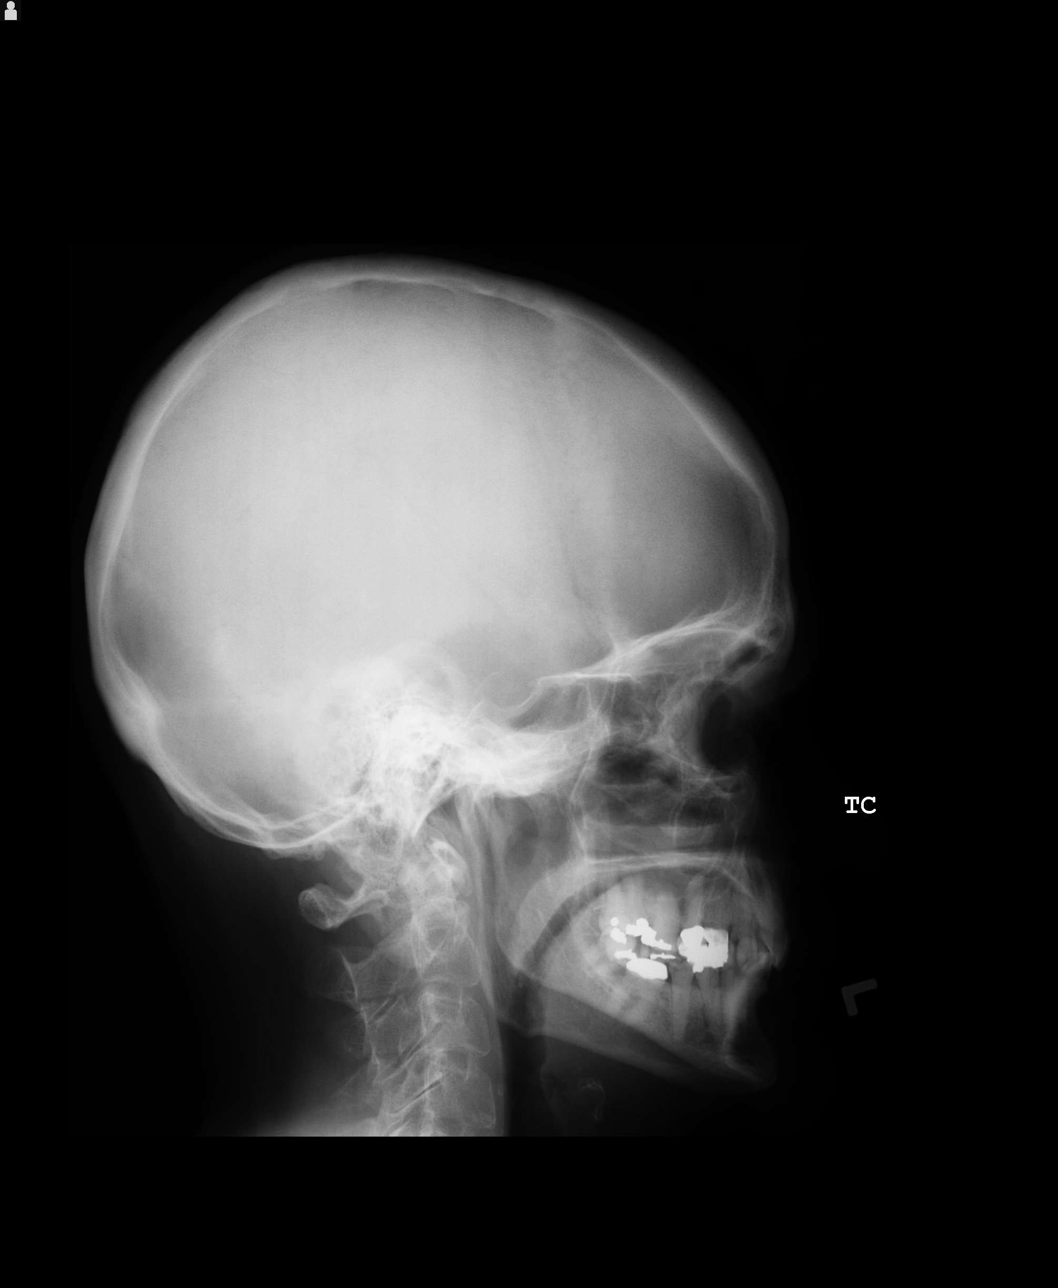

[4 of 4 positions shown; findings below may reference images not displayed]

FINDINGS: There are bilateral maxillary sinus air fluid levels,
i.e. acute sinusitis.  Frontal sinuses hypoplastic.  There may be
mucosal thickening of the sphenoid sinus.
IMPRESSION: Bilateral acute maxillary sinusitis.

## 2010-02-06 ENCOUNTER — Encounter: Admission: RE | Admit: 2010-02-06 | Discharge: 2010-02-06 | Payer: Self-pay | Admitting: Family Medicine

## 2010-07-13 ENCOUNTER — Encounter: Payer: Self-pay | Admitting: Family Medicine

## 2011-01-28 ENCOUNTER — Ambulatory Visit (INDEPENDENT_AMBULATORY_CARE_PROVIDER_SITE_OTHER): Payer: Medicare Other | Admitting: Family Medicine

## 2011-01-28 ENCOUNTER — Encounter: Payer: Self-pay | Admitting: Family Medicine

## 2011-01-28 VITALS — BP 160/83 | HR 89 | Temp 97.8°F | Ht 67.25 in | Wt 174.0 lb

## 2011-01-28 DIAGNOSIS — Z9889 Other specified postprocedural states: Secondary | ICD-10-CM

## 2011-01-28 DIAGNOSIS — I10 Essential (primary) hypertension: Secondary | ICD-10-CM

## 2011-01-28 DIAGNOSIS — H8109 Meniere's disease, unspecified ear: Secondary | ICD-10-CM

## 2011-01-28 DIAGNOSIS — B059 Measles without complication: Secondary | ICD-10-CM | POA: Insufficient documentation

## 2011-01-28 DIAGNOSIS — E785 Hyperlipidemia, unspecified: Secondary | ICD-10-CM | POA: Insufficient documentation

## 2011-01-28 DIAGNOSIS — N8111 Cystocele, midline: Secondary | ICD-10-CM

## 2011-01-28 DIAGNOSIS — Z8619 Personal history of other infectious and parasitic diseases: Secondary | ICD-10-CM | POA: Insufficient documentation

## 2011-01-28 DIAGNOSIS — Z974 Presence of external hearing-aid: Secondary | ICD-10-CM

## 2011-01-28 DIAGNOSIS — H409 Unspecified glaucoma: Secondary | ICD-10-CM

## 2011-01-28 DIAGNOSIS — N8110 Cystocele, unspecified: Secondary | ICD-10-CM

## 2011-01-28 DIAGNOSIS — Z0000 Encounter for general adult medical examination without abnormal findings: Secondary | ICD-10-CM

## 2011-01-28 DIAGNOSIS — B019 Varicella without complication: Secondary | ICD-10-CM | POA: Insufficient documentation

## 2011-01-28 DIAGNOSIS — B029 Zoster without complications: Secondary | ICD-10-CM

## 2011-01-28 MED ORDER — POTASSIUM CHLORIDE 20 MEQ PO PACK: 20.0000 meq | PACK | Freq: Every day | ORAL | Status: DC

## 2011-01-28 MED ORDER — TRIAMTERENE-HCTZ 37.5-25 MG PO TABS: 1.0000 | ORAL_TABLET | Freq: Every day | ORAL | Status: DC

## 2011-01-28 MED ORDER — METOPROLOL TARTRATE 25 MG PO TABS: 25.0000 mg | ORAL_TABLET | Freq: Every day | ORAL | Status: DC

## 2011-01-28 NOTE — Patient Instructions (Addendum)
Hypercholesterolemia High Blood Cholesterol Cholesterol is a white, waxy, fat-like protein needed by your body in small amounts. The liver makes all the cholesterol you need. It is carried from the liver by the blood through the blood vessels. Deposits (plaque) may build up on blood vessel walls. This makes the arteries narrower and stiffer. Plaque increases the risk for heart attack and stroke. You cannot feel your cholesterol level even if it is very high. The only way to know is by a blood test to check your lipid (fats) levels. Once you know your cholesterol levels, you should keep a record of the test results. Work with your caregiver to to keep your levels in the desired range. WHAT THE RESULTS MEAN:  Total cholesterol is a rough measure of all the cholesterol in your blood.   LDL is the so-called bad cholesterol. This is the type that deposits cholesterol in the walls of the arteries. You want this level to be low.   HDL is the good cholesterol because it cleans the arteries and carries the LDL away. You want this level to be high.   Triglycerides are fat that the body can either burn for energy or store. High levels are closely linked to heart disease.  DESIRED LEVELS:  Total cholesterol below 200.   LDL below 100 for people at risk, below 70 for very high risk.   HDL above 50 is good, above 60 is best.   Triglycerides below 150.  HOW TO LOWER YOUR CHOLESTEROL:  Diet.   Choose fish or white meat chicken and turkey, roasted or baked. Limit fatty cuts of red meat, fried foods, and processed meats, such as sausage and lunch meat.   Eat lots of fresh fruits and vegetables. Choose whole grains, beans, pasta, potatoes and cereals.   Use only small amounts of olive, corn or canola oils. Avoid butter, mayonnaise, shortening or palm kernel oils. Avoid foods with trans-fats.   Use skim/nonfat milk and low-fat/nonfat yogurt and cheeses. Avoid whole milk, cream, ice cream, egg yolks and  cheeses. Healthy desserts include angel food cake, gingersnaps, animal crackers, hard candy, popsicles, and low-fat/nonfat frozen yogurt. Avoid pastries, cakes, pies and cookies.   Exercise.   A regular program helps decrease LDL and raises HDL.   Helps with weight control.   Do things that increase your activity level like gardening, walking, or taking the stairs.   Medication.   May be prescribed by your caregiver to help lowering cholesterol and the risk for heart disease.   You may need medicine even if your levels are normal if you have several risk factors.  HOME CARE INSTRUCTIONS  Follow your diet and exercise programs as suggested by your caregiver.   Take medications as directed.   Have blood work done when your caregiver feels it is necessary.  MAKE SURE YOU:   Understand these instructions.   Will watch your condition.   Will get help right away if you are not doing well or get worse.  Document Released: 06/07/2005 Document Re-Released: 05/20/2008 [employer] Patient Information 2011 [employer].Preventative Care for Adults - Female Studies show that half of deaths in the [place] today result from unhealthy lifestyle practices. This includes ignoring preventive care suggestions. Preventive health guidelines for women include the following key practices:  A routine yearly physical is a good way to check with your primary caregiver about your health and preventive screening. It is a chance to share any concerns and updates on your health, and   to receive a thorough all-over exam.   If you smoke cigarettes, find out from your caregiver how to quit. It can literally save your life, no matter how long you have been a tobacco user. If you do not use tobacco, never start.   Maintain a healthy diet and normal weight. Increased weight leads to problems with blood pressure and diabetes. Decrease saturated fat in your diet and increase regular exercise. Eat a variety of  foods, including fruit, vegetables, animal or vegetable protein (meat, fish, chicken, and eggs, or beans, lentils, and tofu), and grains, such as rice. Get information about proper diet from your caregiver, if needed.   Aerobic exercise helps maintain good heart health. The CDC and the American College of Sports Medicine recommend 30 minutes of moderate-intensity exercise (a brisk walk that increases your heart rate and breathing) on most days of the week. Ongoing high blood pressure should be treated with medicines, if weight loss and exercise are not effective.   Avoid smoking, drinking too much alcohol (more than two drinks per day), and use of street drugs. Do not share needles with anyone. Ask for professional help if you need support or instructions about stopping the use of alcohol, cigarettes, or drugs.   Maintain normal blood lipids and cholesterol, by minimizing your intake of saturated fat. Eat a well rounded diet, with plenty of fruit and vegetables. The National Institutes of Health encourage women to eat 5-9 servings of fruit and vegetables each day. Your caregiver can give instructions to help you keep your risk of heart disease or stroke low. High blood pressure causes heart disease and increases risk of stroke. Blood pressure should be checked every 1-2 years, from age 20 onward.   Blood tests for high cholesterol, which causes heart and vessel disease, should begin at age 20 and be repeated every 5 years, if test results are normal. (Repeat tests more often if results are high.)   Diabetes screening involves taking a blood sample to check your blood sugar level, after a fasting period. This is done once every 3 years, after age 45, if test results are normal.   Breast cancer screening is essential to preventive care for women. All women age 20 and older should perform a breast self-exam every month. At age 40 and older, women should have their caregiver complete a breast exam each year.  Women at ages 40-50 should have a mammogram (x-ray film) of the breasts each year. Your caregiver can discuss when to start your yearly mammograms.   Cervical cancer screening includes taking a Pap smear (sample of cells examined under a microscope) from the cervix (end of the uterus). It also includes testing for HPV (Human Papilloma Virus, which can cause cervical cancer). Screening and a pelvic exam should begin at age 21, or 3 years after a woman becomes sexually active. Screening should occur every year, with a Pap smear but no HPV testing, up to age 30. After age 30, you should have a Pap smear every 3 years with HPV testing, if no HPV was found previously.   Colon cancer can be detected, and often prevented, long before it is life threatening. Most routine colon cancer screening begins at the age of 50. On a yearly basis, your caregiver may provide easy-to-use take-home tests to check for hidden blood in the stool. Use of a small camera at the end of a tube, to directly examine the colon (sigmoidoscopy or colonoscopy), can detect the earliest forms of colon   cancer and can be life saving. Talk to your caregiver about this at age 50, when routine screening begins. (Screening is repeated every 5 years, unless early forms of pre-cancerous polyps or small growths are found.)   Practice safe sex. Use condoms. Condoms are used for birth control and to reduce the spread of sexually transmitted infections (STIs). Unsafe sex is sexual activity without the use of safeguards, such as condoms and avoidance of high-risk acts, to reduce the chances of getting or spreading STIs. STIs include gonorrhea (the clap), chlamydia, syphilis, trichimonas, herpes, HPV (human papilloma virus) and HIV (human immunodeficiency virus), which causes AIDS. Herpes, HIV, and HPV are viral illnesses that have no cure. They can result in disability, cancer, and death.   HPV causes cancer of the cervix, and other infections that can be  transmitted from person to person. There is a vaccine for HPV, and females should get immunized between the ages of 11 and 26. It requires a series of 3 shots.   Osteoporosis is a disease in which the bones lose minerals and strength as we age. This can result in serious bone fractures. Risk of osteoporosis can be identified using a bone density scan. Women ages 65 and over should discuss this with their caregivers, as should women after menopause who have other risk factors. Ask your caregiver whether you should be taking a calcium supplement and Vitamin D, to reduce the rate of osteoporosis.   Menopause can be associated with physical symptoms and risks. Hormone replacement therapy is available to decrease these. You should talk to your caregiver about whether starting or continuing to take hormones is right for you.   Use sunscreen with SPF (skin protection factor) of 15 or more. Apply sunscreen liberally and repeatedly throughout the day. Being outside in the sun, when your shadow is shorter than you are, means you are being exposed to sun at greater intensity. Lighter skinned people are at a greater risk of skin cancer. Wear sunglasses, to protect your eyes from too much damaging sunlight (which can speed up cataract formation).   Once a month, do a whole body skin exam or review, using a mirror to look at your back. Notify your caregiver of changes in moles, especially if there are changes in shapes, colors, irregular border, a size larger than a pencil eraser, or new moles develop.   Keep carbon monoxide and smoke detectors in your home, and functioning, at all times. Change the batteries every 6 months, or use a model that plugs into the wall.   Stay up to date with your tetanus shots and other required immunizations. A booster for tetanus should be given every 10 years. Be sure to get your flu shot every year, since 5%-20% of the [place] population comes down with the flu. The composition of the  flu vaccine changes each year, so being vaccinated once is not enough. Get your shot in the fall, before the flu season peaks. The table below lists important vaccines to get. Other vaccines to consider include for Hepatitis A virus (to prevent a form of infection of the liver, by a virus acquired from food), Varicella Zoster (a virus that causes shingles), and Meninogoccal (against bacteria which cause a form of meningitis).   Brush your teeth twice a day with fluoride toothpaste, and floss once a day. Good oral hygiene prevents tooth decay and gum disease, which can be painful and can cause other health problems. Visit your dentist for a routine oral and   dental check up and preventive care every 6-12 months.   The Body Mass Index or BMI is a way of measuring how much of your body is fat. Having a BMI above 27 increases the risk of heart disease, diabetes, hypertension, stroke and other problems related to obesity. Your caregiver can help determine your BMI, and can develop an exercise and dietary program to help you achieve or maintain this measurement at a healthy level.   Wear seat belts whenever you are in a vehicle, whether as passenger or driver, and even for short drives of a few minutes.   If you bicycle, wear a helmet at all times.  Preventative Care for Adult Women  Preventative Services Ages 19-39 Ages 40-64 Ages 65 and over  Health risk assessment and lifestyle counseling.     Blood pressure check.** Every 1-2 years Every 1-2 years Every 1-2 years  Total cholesterol check including HDL.** Every 5 years beginning at age 35 Every 5 years beginning at age 20, or more often if risk is high Every 5 years through age 75, then optional  Breast self exam. Monthly in all women ages 20 and older Monthly Monthly  Clinical breast exam.** Every 3 years beginning at age 20 Every year Every year  Mammogram.**  Every year beginning at age 50, optional from age 40-49 (discuss with your caregiver). Every  year until age 75, then optional  Pap Smear** and HPV Screening. Every year from ages 21 through 29 Every 3 years from ages 30 through 65, if HPV is negative Optional; talk with your caregiver  Flexible sigmoidoscopy** or colonoscopy.**   Every 5 years beginning at age 50 Every 5 years until age 80; then optional  FOBT (fecal occult blood test) of stool.  Every year beginning at age 50 Every year until 80; then optional  Skin self-exam. Monthly Monthly Monthly  Tetanus-diphtheria (Td) immunization. Every 10 years Every 10 years Every 10 years  Influenza immunization.** Every year Every year Every year  HPV immunization. Once between the ages of 11 and 26     Pneumococcal immunization.** Optional Optional Every 5 years  Hepatitis B immunization.** Series of 3 immunizations  (if not done previously, usually given at 0, 1 to 2, and 4 to 6 months)  Check with your caregiver, if vaccination not previously given Check with your caregiver, if vaccination not previously given  ** Family history and personal history of risk and conditions may change your caregiver's recommendations.  Document Released: 08/03/2001 Document Re-Released: 09/01/2009 [employer] Patient Information 2011 [employer]. 

## 2011-01-29 ENCOUNTER — Encounter: Payer: Self-pay | Admitting: Family Medicine

## 2011-01-29 DIAGNOSIS — I10 Essential (primary) hypertension: Secondary | ICD-10-CM

## 2011-01-29 DIAGNOSIS — N8110 Cystocele, unspecified: Secondary | ICD-10-CM

## 2011-01-29 DIAGNOSIS — Z0000 Encounter for general adult medical examination without abnormal findings: Secondary | ICD-10-CM | POA: Insufficient documentation

## 2011-01-29 DIAGNOSIS — H409 Unspecified glaucoma: Secondary | ICD-10-CM | POA: Insufficient documentation

## 2011-01-29 HISTORY — DX: Essential (primary) hypertension: I10

## 2011-01-29 HISTORY — DX: Cystocele, unspecified: N81.10

## 2011-01-29 HISTORY — DX: Encounter for general adult medical examination without abnormal findings: Z00.00

## 2011-01-29 NOTE — Progress Notes (Signed)
[facility] [id number] Apr 19, 1941 01/29/2011      Progress Note New Patient  Subjective  Chief Complaint  Chief Complaint  Patient presents with  . Establish Care    new patient    HPI  Patient is a 70 year old Caucasian female who is in today new patient appointment. She is a long history of Mnire's disease and has been noting her blood pressures been trending up over the last year. Has been maintained on Maxzide for years for her Mnire's disease. Reports low in her hearing has worsened. 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 She has hearing aids which are needing adjustment but has been helpful. She's had no recent illness, fevers, chills, chest pain, palpitations, shortness of breath, GI or GU concerns. On further questioning she does know she's had a couple of episodes of dark stool but that was back in June and this has not recurred nor hasn't happened since. She has previously declined colonoscopies and clot declines today but agrees to stool cards. She follows with an eye doctor and is on multiple drops for glaucoma she reports is stable. She does have some pelvic or pressure she thinks is a prolapsed bladder but denies any incontinence, dysuria or other GU concerns.  Past Medical History  Diagnosis Date  . Meniere's disease   . Hearing aid worn   . Chicken pox as a child  . Shingles   . Measles as a child  . Hyperlipidemia   . Female bladder prolapse, acquired 01/29/2011  . Preventative health care 01/29/2011  . HTN (hypertension) 01/29/2011  . Glaucoma 01/29/2011    Past Surgical History  Procedure Date  . Lump removed on left breast     clogged milk duct  . Tubes tied     Family History  Problem Relation Age of Onset  . Cancer Mother     breast  . Stroke Father   . Heart disease Father   . Hyperlipidemia Father   . Hypertension Father   . Cancer Sister 68   breast- remission  . Cancer Maternal Grandmother     uterus    History   Social History  . Marital Status: Widowed    Spouse Name: N/A    Number of Children: N/A  . Years of Education: N/A   Occupational History  . Not on file.   Social History Main Topics  . Smoking status: Former Smoker    Types: Cigarettes    Quit date: 06/21/1970  . Smokeless tobacco: Never Used   Comment: occasional smoker  . Alcohol Use: No  . Drug Use: No  . Sexually Active: No   Other Topics Concern  . Not on file   Social History Narrative  . No narrative on file    No current outpatient prescriptions on file prior to visit.    No Known Allergies  Review of Systems  Review of Systems  Constitutional: Negative for fever, chills, weight loss and malaise/fatigue.  HENT: Positive for hearing loss and tinnitus. Negative for ear pain, nosebleeds, congestion, sore throat and ear discharge.   Eyes: Negative for photophobia, pain and discharge.  Respiratory: Negative for shortness of breath.   Cardiovascular: Negative for chest pain, palpitations and leg swelling.  Gastrointestinal: Positive for melena. Negative for nausea, abdominal pain, diarrhea, constipation and blood in stool.       Patient describes  several days of dark stool in June, no recurrence.  Genitourinary: Negative for dysuria.  Musculoskeletal: Negative for falls.  Skin: Negative for rash.  Neurological: Negative for loss of consciousness, weakness and headaches.  Endo/Heme/Allergies: Negative for polydipsia.  Psychiatric/Behavioral: Negative for depression and suicidal ideas. The patient is not nervous/anxious and does not have insomnia.     Objective  BP 160/83  Pulse 89  Temp(Src) 97.8 F (36.6 C) (Oral)  Ht 5' 7.25" (1.708 m)  Wt 174 lb (78.926 kg)  BMI 27.05 kg/m2  SpO2 95%  Physical Exam  Physical Exam  Constitutional: She is oriented to person, place, and time and well-developed, well-nourished, and in no  distress. No distress.  HENT:  Head: Normocephalic and atraumatic.  Right Ear: External ear normal.  Left Ear: External ear normal.  Nose: Nose normal.  Mouth/Throat: Oropharynx is clear and moist. No oropharyngeal exudate.  Eyes: Conjunctivae are normal. Pupils are equal, round, and reactive to light. Right eye exhibits no discharge. Left eye exhibits no discharge. No scleral icterus.  Neck: Normal range of motion. Neck supple. No thyromegaly present.  Cardiovascular: Normal rate, regular rhythm, normal heart sounds and intact distal pulses.   No murmur heard. Pulmonary/Chest: Effort normal and breath sounds normal. No respiratory distress. She has no wheezes. She has no rales.  Abdominal: Soft. Bowel sounds are normal. She exhibits no distension and no mass. There is no tenderness.  Musculoskeletal: Normal range of motion. She exhibits no edema and no tenderness.  Lymphadenopathy:    She has no cervical adenopathy.  Neurological: She is alert and oriented to person, place, and time. She has normal reflexes. No cranial nerve deficit. Coordination normal.  Skin: Skin is warm and dry. No rash noted. She is not diaphoretic.  Psychiatric: Mood, memory and affect normal.       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 She has recently gotten new hearing aides and these are proving helpful but she has had to have them adjusted.  Hyperlipidemia Patient is taking Red Yeast Rice and tolerating it since earlier this year. She was on Welchol powder but did not tolerate it. Will repeat lipid panel next month and reevaluate after that.  Shingles Reports recurrent outbreaks on her back never  long lived or severe as they were with the first outbreak if this worsens she will come in to office for evaluation  Meniere's disease Has been maintained on Maxzide will continue the same at this time  HTN (hypertension) Add Metoprolol 25 mg daily  Glaucoma On multiple eye drops and following with eye doctor

## 2011-01-29 NOTE — Assessment & Plan Note (Signed)
On multiple eye drops and following with eye doctor

## 2011-01-29 NOTE — Assessment & Plan Note (Signed)
Patient reports some pressure in the pelvic floor, is encouraged to try kegel exercises  Sets of 10 twice a day until seen again

## 2011-01-29 NOTE — Assessment & Plan Note (Signed)
Reports recurrent outbreaks on her back never long lived or severe as they were with the first outbreak if this worsens she will come in to office for evaluation

## 2011-01-29 NOTE — Assessment & Plan Note (Signed)
Add Metoprolol 25 mg daily

## 2011-01-29 NOTE — Assessment & Plan Note (Signed)
Has been maintained on Maxzide will continue the same at this time

## 2011-01-29 NOTE — Assessment & Plan Note (Signed)
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 She has recently gotten new hearing aides and these are proving helpful but she has had to have them adjusted.

## 2011-01-29 NOTE — Assessment & Plan Note (Signed)
Patient is taking Red Yeast Rice and tolerating it since earlier this year. She was on Welchol powder but did not tolerate it. Will repeat lipid panel next month and reevaluate after that.

## 2011-02-25 ENCOUNTER — Ambulatory Visit (INDEPENDENT_AMBULATORY_CARE_PROVIDER_SITE_OTHER): Payer: Medicare Other | Admitting: Family Medicine

## 2011-02-25 ENCOUNTER — Other Ambulatory Visit (HOSPITAL_COMMUNITY)
Admission: RE | Admit: 2011-02-25 | Discharge: 2011-02-25 | Disposition: A | Discharge status: Home / Self Care | Payer: Medicare Other | Source: Ambulatory Visit | Attending: Family Medicine | Admitting: Family Medicine

## 2011-02-25 ENCOUNTER — Encounter: Payer: Self-pay | Admitting: Family Medicine

## 2011-02-25 ENCOUNTER — Other Ambulatory Visit: Payer: Self-pay | Admitting: Family Medicine

## 2011-02-25 DIAGNOSIS — K921 Melena: Secondary | ICD-10-CM

## 2011-02-25 DIAGNOSIS — R102 Pelvic and perineal pain: Secondary | ICD-10-CM

## 2011-02-25 DIAGNOSIS — Z01419 Encounter for gynecological examination (general) (routine) without abnormal findings: Secondary | ICD-10-CM | POA: Insufficient documentation

## 2011-02-25 DIAGNOSIS — Z124 Encounter for screening for malignant neoplasm of cervix: Secondary | ICD-10-CM

## 2011-02-25 DIAGNOSIS — H409 Unspecified glaucoma: Secondary | ICD-10-CM

## 2011-02-25 DIAGNOSIS — N949 Unspecified condition associated with female genital organs and menstrual cycle: Secondary | ICD-10-CM

## 2011-02-25 DIAGNOSIS — I10 Essential (primary) hypertension: Secondary | ICD-10-CM

## 2011-02-25 DIAGNOSIS — R109 Unspecified abdominal pain: Secondary | ICD-10-CM

## 2011-02-25 DIAGNOSIS — R195 Other fecal abnormalities: Secondary | ICD-10-CM

## 2011-02-25 DIAGNOSIS — N8111 Cystocele, midline: Secondary | ICD-10-CM

## 2011-02-25 DIAGNOSIS — N8110 Cystocele, unspecified: Secondary | ICD-10-CM

## 2011-02-25 DIAGNOSIS — R197 Diarrhea, unspecified: Secondary | ICD-10-CM

## 2011-02-25 DIAGNOSIS — E785 Hyperlipidemia, unspecified: Secondary | ICD-10-CM

## 2011-02-25 LAB — POCT URINALYSIS DIPSTICK
Leukocytes, UA: NEGATIVE
Urobilinogen, UA: 0.2
pH, UA: 7

## 2011-02-25 MED ORDER — HYOSCYAMINE SULFATE 0.125 MG SL SUBL: 0.1250 mg | SUBLINGUAL_TABLET | SUBLINGUAL | Status: AC | PRN

## 2011-02-25 NOTE — Patient Instructions (Signed)
Prolapse of the Rectum, Bladder,   Vagina, or Uterus   Prolapse means the falling down, bulging, dropping, or drooping of a body part. Organs that commonly prolapse include the rectum, small intestine, bladder, urethra, vagina (birth canal), uterus (womb), and cervix. Prolapse occurs when the ligaments and muscle tissue around the rectum, bladder, and uterus are damaged or weakened.   CAUSES   This happens especially with:   Childbirth. Some women feel pelvic pressure or have trouble holding their urine right after childbirth, because of stretching and tearing of pelvic tissues. This generally gets better with time and the feeling usually goes away, but it may return with aging.   Chronic heavy lifting.   Aging.   Menopause, with loss of estrogen production weakening the pelvic ligaments and muscles.   Past pelvic surgery.   Obesity.   Chronic constipation.   Chronic cough.   Prolapse may affect a single organ, or several organs may prolapse at the same time. The front wall of the vagina holds up the bladder. The back wall holds up part of the lower intestine, or rectum. The uterus fills a spot in the middle. All these organs can be involved when the ligaments and muscles around the vagina relax too much. This often gets worse when women stop producing estrogen (menopause).   SYMPTOMS   Uncontrolled loss of urine (incontinence) with cough, sneeze, straining, and exercise.   More force may be required to have a bowel movement, due to trapping of the stool.   When part of an organ bulges through the opening of the vagina, there is sometimes a feeling of heaviness or pressure. It may feel as though something is falling out. This sensation increases with coughing or bearing down.   If the organs protrude through the opening of the vagina and rub against the clothing, there may be soreness, ulcers, infection, pain, and bleeding.   Lower back pain.   Pushing in the upper or lower part of the vagina, to pass urine or have  a bowel movement.   Problems having sexual intercourse.   Being unable to insert a tampon or applicator.   DIAGNOSIS   Usually, a physical exam is all that is needed to identify the problem. During the examination, you may be asked to cough and strain while lying down, sitting up, and standing up. Your caregiver will determine if more testing is required, such as bladder function tests. Some diagnoses are:   Cystocele: Bulging and falling of the bladder into the top of the vagina.   Rectocele: Part of the rectum bulging into the vagina.   Prolapse of the uterus: The uterus falls or drops into the vagina.   Enterocele: Bulging of the top of the vagina, after a hysterectomy (uterus removal), with the small intestine bulging into the vagina. A hernia in the top of the vagina.   Urethrocele: The urethra (urine carrying tube) bulging into the vagina.   TREATMENT   In most cases, prolapse needs to be treated only if it produces symptoms. If the symptoms are interfering with your usual daily or sexual activities, treatment may be necessary. The following are some measures that may be used to treat prolapse.   Estrogen may help elderly women with mild prolapse.   Kegel exercises may help mild cases of prolapse, by strengthening and tightening the muscles of the pelvic floor.   Pessaries are used in women who choose not to, or are unable to, have surgery. A pessary is   a doughnut-shaped piece of plastic or rubber that is put into the vagina to keep the organs in place. This device must be fitted by your caregiver. Your caregiver will also explain how to care for yourself with the pessary. If it works well for you, this may be the only treatment required.   Surgery is often the only form of treatment for more severe prolapses. There are different types of surgery available. You should discuss what the best procedure is for you. If the uterus is prolapsed, it may be removed (hysterectomy) as part of the surgical treatment. Your  caregiver will discuss the risks and benefits with you.   Uterine-vaginal suspension (surgery to hold up the organs) may be used, especially if you want to maintain your fertility.   No form of treatment is guaranteed to correct the prolapse or relieve the symptoms.   HOME CARE INSTRUCTIONS   Wear a sanitary pad or absorbent product if you have incontinence of urine.   Avoid heavy lifting and straining with exercise and work.   Take over-the-counter pain medicine for minor discomfort.   Try taking estrogen or using estrogen vaginal cream.   Try Kegel exercises or use a pessary, before deciding to have surgery.   Do Kegel exercises after having a baby.   SEEK MEDICAL CARE IF:   Your symptoms interfere with your daily activities.   You need medicine to help with the discomfort.   You need to be fitted with a pessary.   You notice bleeding from the vagina.   You think you have ulcers or you notice ulcers on the cervix.   You have an oral temperature above 102° F (38.9° C).   You develop pain or blood with urination.   You have bleeding with a bowel movement.   The symptoms are interfering with your sex life.   You have urinary incontinence that interferes with your daily activities.   You lose urine with sexual intercourse.   You have a chronic cough.   You have chronic constipation.   PREVENTION   Avoiding work that requires chronic heavy lifting may help in preventing prolapse. Taking estrogen or using estrogen cream in the vagina during menopause, if you have no problems with this treatment, may help prevent prolapse. Avoid becoming overweight. Treat chronic cough or constipation if you have it. Doing Kegel exercises after having a baby can help. However, having a cesarean delivery (C-section) to avoid vaginal childbirth is not a good option, because surgery has many risks.   Document Released: 12/12/2002 Document Re-Released: 06/29/2009   [employer]® Patient Information ©2011 [employer].

## 2011-02-26 LAB — HEPATIC FUNCTION PANEL
ALT: 13 U/L (ref 0–35)
Bilirubin, Direct: 0.1 mg/dL (ref 0.0–0.3)
Indirect Bilirubin: 0.2 mg/dL (ref 0.0–0.9)

## 2011-02-26 LAB — CBC WITH DIFFERENTIAL/PLATELET
Basophils Absolute: 0 10*3/uL (ref 0.0–0.1)
Eosinophils Absolute: 0.1 10*3/uL (ref 0.0–0.7)
Eosinophils Relative: 1 % (ref 0–5)
HCT: 43.5 % (ref 36.0–46.0)
Lymphocytes Relative: 27 % (ref 12–46)
Lymphs Abs: 1.9 10*3/uL (ref 0.7–4.0)
MCH: 29.5 pg (ref 26.0–34.0)
MCV: 91.6 fL (ref 78.0–100.0)
Monocytes Relative: 6 % (ref 3–12)
RBC: 4.75 MIL/uL (ref 3.87–5.11)

## 2011-02-26 LAB — BASIC METABOLIC PANEL
BUN: 13 mg/dL (ref 6–23)
Calcium: 9.4 mg/dL (ref 8.4–10.5)
Creat: 0.75 mg/dL (ref 0.50–1.10)
Potassium: 4.2 mEq/L (ref 3.5–5.3)
Sodium: 138 mEq/L (ref 135–145)

## 2011-02-26 LAB — LIPID PANEL
LDL Cholesterol: 129 mg/dL — ABNORMAL HIGH (ref 0–99)
Total CHOL/HDL Ratio: 4.2 Ratio

## 2011-02-26 LAB — TSH: TSH: 0.819 u[IU]/mL (ref 0.350–4.500)

## 2011-03-02 ENCOUNTER — Encounter: Payer: Self-pay | Admitting: Gastroenterology

## 2011-03-02 ENCOUNTER — Encounter: Payer: Self-pay | Admitting: Family Medicine

## 2011-03-02 DIAGNOSIS — R109 Unspecified abdominal pain: Secondary | ICD-10-CM | POA: Insufficient documentation

## 2011-03-02 NOTE — Assessment & Plan Note (Signed)
Adequately controlled on current meds, no changes. 

## 2011-03-02 NOTE — Assessment & Plan Note (Deleted)
Pap smear taken today. Prolapse is noted on exam today. Due to her increasing abdominal and pelvic pain she agrees to referral to GYN to discuss her options for further management.

## 2011-03-02 NOTE — Assessment & Plan Note (Signed)
Pap smear taken today. Prolapse is noted on exam today. Due to her increasing abdominal and pelvic pain she agrees to referral to GYN to discuss her options for further management. 

## 2011-03-02 NOTE — Assessment & Plan Note (Signed)
Patient describes 5 discreet episodes of 10 of 10 abdominal pain over a 6 year period. She had her most recent episode while she was in [facility] with her family this last week. She describes the pain as largely contained in the pelvic floor and lower abdominal quadrants. Her for symptoms diarrhea. She had out episodes every hour with severe cramping abdominal pain. Nausea and anorexia developed. No vomiting. She took half a bottle of Pepto-Bismol and then noted her stool turned black. Over 24 hours for pain resolved. She did have fevers and chills over this time period today she had formed brown stool. At her initial patient when we tried to talk her into colonoscopy she declined. She has agreed to referral to GI and colonoscopy at this time due to her severe pain and further discussions with family this will be arranged today.

## 2011-03-02 NOTE — Progress Notes (Signed)
Jasmine Wilson [id number] July 21, 1940 03/02/2011      Progress Note-Follow Up  Subjective  Chief Complaint  Chief Complaint  Patient presents with  . Vaginal Pain    X 6 and pain in "crotch"  . Melena    black tarry stool    HPI  Patient is a 70 year old Caucasian female in today for an acute visit secondary to a recent episode of abdominal and pelvic pain. She was in [facility] with her family when she developed lower abdominal and pelvic pain, diarrhea, nausea, fevers and chills. She tried multiple over-the-counter medications including Pepto-Bismol and slowly her symptoms resolved. She describes a history of 5 similar episodes over a six-week year. In her past. After the Pepto-Bismol her stool turned dark but today they were formed and brown. She has no further fevers chills. She does continue to have urinary urgency but denies dysuria. No chest pain, palpitations, shortness of breath at this time. Menarche was age 45 menopause at 42 she was always regular and her only GYN history is tubal ligation she has not had a Pap smear and a number of years and denies any history of abnormal Paps.  Past Medical History  Diagnosis Date  . Meniere's disease   . Hearing aid worn   . Chicken pox as a child  . Shingles   . Measles as a child  . Hyperlipidemia   . Female bladder prolapse, acquired 01/29/2011  . Preventative health care 01/29/2011  . HTN (hypertension) 01/29/2011  . Glaucoma 01/29/2011  . Abdominal pain 03/02/2011    Past Surgical History  Procedure Date  . Lump removed on left breast     clogged milk duct, benign  . Tubes tied     Family History  Problem Relation Age of Onset  . Cancer Mother     breast  . Stroke Father   . Heart disease Father   . Hyperlipidemia Father   . Hypertension Father   . Cancer Sister 50    breast- remission  . Cancer Maternal Grandmother     uterus    History   Social History  . Marital Status: Widowed    Spouse Name: N/A    Number of  Children: N/A  . Years of Education: N/A   Occupational History  . Not on file.   Social History Main Topics  . Smoking status: Former Smoker    Types: Cigarettes    Quit date: 06/21/1970  . Smokeless tobacco: Never Used   Comment: occasional smoker  . Alcohol Use: No  . Drug Use: No  . Sexually Active: No   Other Topics Concern  . Not on file   Social History Narrative  . No narrative on file    Current Outpatient Prescriptions on File Prior to Visit  Medication Sig Dispense Refill  . dorzolamide (TRUSOPT) 2 % ophthalmic solution       . latanoprost (XALATAN) 0.005 % ophthalmic solution       . metoprolol tartrate (LOPRESSOR) 25 MG tablet Take 1 tablet (25 mg total) by mouth daily after supper.  30 tablet  5  . potassium chloride (KLOR-CON) 20 MEQ packet Take 20 mEq by mouth daily.  90 tablet  3  . timolol (TIMOPTIC) 0.5 % ophthalmic solution       . triamterene-hydrochlorothiazide (MAXZIDE-25) 37.5-25 MG per tablet Take 1 tablet by mouth daily.  90 tablet  3    No Known Allergies  Review of Systems  Review of Systems  Constitutional: Negative for fever and malaise/fatigue.  HENT: Negative for congestion.   Eyes: Negative for discharge.  Respiratory: Negative for shortness of breath.   Cardiovascular: Negative for chest pain, palpitations and leg swelling.  Gastrointestinal: Positive for nausea, abdominal pain and diarrhea. Negative for constipation and blood in stool.       Dark stool  Genitourinary: Negative for dysuria.  Musculoskeletal: Negative for falls.  Skin: Negative for rash.  Neurological: Negative for loss of consciousness and headaches.  Endo/Heme/Allergies: Negative for polydipsia.  Psychiatric/Behavioral: Negative for depression and suicidal ideas. The patient is not nervous/anxious and does not have insomnia.     Objective  BP 141/83  Pulse 74  Temp(Src) 98 F (36.7 C) (Oral)  Ht 5' 7.25" (1.708 m)  Wt 174 lb (78.926 kg)  BMI 27.05 kg/m2   SpO2 96%  Physical Exam  Physical Exam  Constitutional: She is oriented to person, place, and time and well-developed, well-nourished, and in no distress. No distress.  HENT:  Head: Normocephalic and atraumatic.  Eyes: Conjunctivae are normal.  Neck: Neck supple. No thyromegaly present.  Cardiovascular: Normal rate, regular rhythm and normal heart sounds.   Pulmonary/Chest: Effort normal and breath sounds normal. She has no wheezes.  Abdominal: She exhibits no distension and no mass.  Genitourinary: Vagina normal, uterus normal, cervix normal, right adnexa normal and left adnexa normal. No vaginal discharge found.       Bladder noted at the introitus  Musculoskeletal: She exhibits no edema.  Lymphadenopathy:    She has no cervical adenopathy.  Neurological: She is alert and oriented to person, place, and time.  Skin: Skin is warm and dry. No rash noted. She is not diaphoretic.  Psychiatric: Memory, affect and judgment normal.    Lab Results  Component Value Date   TSH 0.819 02/25/2011   Lab Results  Component Value Date   WBC 6.8 02/25/2011   HGB 14.0 02/25/2011   HCT 43.5 02/25/2011   MCV 91.6 02/25/2011   PLT 381 02/25/2011   Lab Results  Component Value Date   CREATININE 0.75 02/25/2011   BUN 13 02/25/2011   NA 138 02/25/2011   K 4.2 02/25/2011   CL 102 02/25/2011   CO2 24 02/25/2011   Lab Results  Component Value Date   ALT 13 02/25/2011   AST 15 02/25/2011   ALKPHOS 71 02/25/2011   BILITOT 0.3 02/25/2011   Lab Results  Component Value Date   CHOL 235* 02/25/2011   Lab Results  Component Value Date   HDL 56 02/25/2011   Lab Results  Component Value Date   LDLCALC 129* 02/25/2011   Lab Results  Component Value Date   TRIG 249* 02/25/2011   Lab Results  Component Value Date   CHOLHDL 4.2 02/25/2011     Assessment & Plan  Female bladder prolapse, acquired Pap smear taken today. Prolapse is noted on exam today. Due to her increasing abdominal and pelvic pain she agrees to referral  to GYN to discuss her options for further management.  HTN (hypertension) Adequately controlled on current meds, no changes  Abdominal pain Patient describes 5 discreet episodes of 10 of 10 abdominal pain over a 6 year period. She had her most recent episode while she was in [facility] with her family this last week. She describes the pain as largely contained in the pelvic floor and lower abdominal quadrants. Her for symptoms diarrhea. She had out episodes every hour with severe cramping abdominal pain. Nausea and  anorexia developed. No vomiting. She took half a bottle of Pepto-Bismol and then noted her stool turned black. Over 24 hours for pain resolved. She did have fevers and chills over this time period today she had formed brown stool. At her initial patient when we tried to talk her into colonoscopy she declined. She has agreed to referral to GI and colonoscopy at this time due to her severe pain and further discussions with family this will be arranged today.

## 2011-03-15 ENCOUNTER — Ambulatory Visit: Payer: Medicare Other | Admitting: Obstetrics & Gynecology

## 2011-03-17 ENCOUNTER — Ambulatory Visit (INDEPENDENT_AMBULATORY_CARE_PROVIDER_SITE_OTHER): Payer: Medicare Other | Admitting: Obstetrics & Gynecology

## 2011-03-17 ENCOUNTER — Encounter: Payer: Self-pay | Admitting: Obstetrics & Gynecology

## 2011-03-17 ENCOUNTER — Telehealth: Payer: Self-pay

## 2011-03-17 VITALS — BP 146/84 | HR 81 | Temp 98.5°F | Resp 16 | Ht 68.0 in | Wt 176.0 lb

## 2011-03-17 DIAGNOSIS — IMO0002 Reserved for concepts with insufficient information to code with codable children: Secondary | ICD-10-CM

## 2011-03-17 DIAGNOSIS — N8111 Cystocele, midline: Secondary | ICD-10-CM

## 2011-03-17 NOTE — Telephone Encounter (Signed)
Patient stopped by the office to tell us that she just left her gyno appt and was fitted for a ring to hold her uterus up. Patient stated that the MD told her that this would not cause pain though. Please advise if patient should be sent somewhere else for the pain issue? Please advise?

## 2011-03-17 NOTE — Progress Notes (Signed)
  Subjective:    Patient ID: Jasmine Wilson, female    DOB: [birth year], 70 y.o.   MRN: [id number]  HPI 70 yo WW [id number] with 20 year h/o a "dropped bladder".  She saw a urologist about 15 years ago who didn't rec treatment at that time.  She denies GSUI or UI.  She will have an occasional incontinence episode "if I wait too long". She had onset of pelvic pain about 6 years ago.This was followed by a second occasion of this pain 2 years ago and again in Sept (one month ago). The pain episodes are always followed by an episode of diarrhea. Her family doctor sent her here for evaluation.   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 She prefers the pessary.  A #3 ring fits her well and relieves the sensation of a cystocele.   She was instructed in removal and insertion.

## 2011-03-18 NOTE — Telephone Encounter (Signed)
So her pain was atypical so I still think the pessary may rearrange things structurally so the pain may not recur. She does need to follow up with Gastroenterology if pain and diarrhea recurs and I would order a CT scan of the abd/pelvis to r/o any other pathology if she would like to. If all of that is normal but the pain recurs then I would recommend some pelvic floor dysfunction Physical therapy

## 2011-03-18 NOTE — Telephone Encounter (Signed)
She goes Oct 9, 12

## 2011-03-18 NOTE — Telephone Encounter (Signed)
Patient informed and states she would like to do the colonoscopy first and see what that says then go from there.

## 2011-03-18 NOTE — Telephone Encounter (Signed)
OK, just make sure she got her GI appt, I referred her back on 9/6

## 2011-03-24 ENCOUNTER — Other Ambulatory Visit: Payer: Medicare Other

## 2011-03-26 ENCOUNTER — Ambulatory Visit (INDEPENDENT_AMBULATORY_CARE_PROVIDER_SITE_OTHER): Payer: Medicare Other | Admitting: Family Medicine

## 2011-03-26 ENCOUNTER — Encounter: Payer: Self-pay | Admitting: Family Medicine

## 2011-03-26 VITALS — BP 125/83 | HR 92 | Temp 98.0°F | Ht 67.25 in | Wt 176.8 lb

## 2011-03-26 DIAGNOSIS — I10 Essential (primary) hypertension: Secondary | ICD-10-CM

## 2011-03-26 DIAGNOSIS — J40 Bronchitis, not specified as acute or chronic: Secondary | ICD-10-CM

## 2011-03-26 MED ORDER — AZITHROMYCIN 250 MG PO TABS: ORAL_TABLET | ORAL | Status: AC

## 2011-03-26 MED ORDER — BENZONATATE 100 MG PO CAPS: 100.0000 mg | ORAL_CAPSULE | Freq: Three times a day (TID) | ORAL | Status: AC | PRN

## 2011-03-26 NOTE — Patient Instructions (Signed)
Bronchitis Bronchitis is the body's way of reacting to injury and/or infection (inflammation) of the bronchi. Bronchi are the air tubes that extend from the windpipe into the lungs. If the inflammation becomes severe, it may cause shortness of breath.  CAUSES Inflammation may be caused by:  A virus.   Germs (bacteria).   Dust.   Allergens.   Pollutants and many other irritants.  The cells lining the bronchial tree are covered with tiny hairs (cilia). These constantly beat upward, away from the lungs, toward the mouth. This keeps the lungs free of pollutants. When these cells become too irritated and are unable to do their job, mucus begins to develop. This causes the characteristic cough of bronchitis. The cough clears the lungs when the cilia are unable to do their job. Without either of these protective mechanisms, the mucus would settle in the lungs. Then you would develop pneumonia. Smoking is a common cause of bronchitis and can contribute to pneumonia. Stopping this habit is the single most important thing you can do to help yourself. TREATMENT  Your caregiver may prescribe an antibiotic if the cough is caused by bacteria. Also, medicines that open up your airways make it easier to breathe. Your caregiver may also recommend or prescribe an expectorant. It will loosen the mucus to be coughed up. Only take over-the-counter or prescription medicines for pain, discomfort, or fever as directed by your caregiver.   Removing whatever causes the problem (smoking, for example) is critical to preventing the problem from getting worse.   Cough suppressants may be prescribed for relief of cough symptoms.   Inhaled medicines may be prescribed to help with symptoms now and to help prevent problems from returning.   For those with recurrent (chronic) bronchitis, there may be a need for steroid medicines.  SEEK IMMEDIATE MEDICAL CARE IF:  During treatment, you develop more pus-like mucus  (purulent sputum).   You or your child has an oral temperature above 102, not controlled by medicine.   Your baby is older than 3 months with a rectal temperature of 102 F (38.9 C) or higher.   Your baby is 21 months old or younger with a rectal temperature of 100.4 F (38 C) or higher.   You become progressively more ill.   You have increased difficulty breathing, wheezing, or shortness of breath.  It is necessary to seek immediate medical care if you are elderly or sick from any other disease. MAKE SURE YOU:  Understand these instructions.   Will watch your condition.   Will get help right away if you are not doing well or get worse.  Document Released: 06/07/2005 Document Re-Released: 09/01/2009 [facility] Patient Information 2011 [place], [place].  Increase clear fluids, use Ginger Ale

## 2011-03-30 ENCOUNTER — Encounter: Payer: Self-pay | Admitting: Gastroenterology

## 2011-03-30 ENCOUNTER — Ambulatory Visit (INDEPENDENT_AMBULATORY_CARE_PROVIDER_SITE_OTHER): Payer: Medicare Other | Admitting: Gastroenterology

## 2011-03-30 VITALS — BP 120/70 | HR 88 | Ht 68.0 in | Wt 177.0 lb

## 2011-03-30 DIAGNOSIS — R197 Diarrhea, unspecified: Secondary | ICD-10-CM

## 2011-03-30 DIAGNOSIS — R109 Unspecified abdominal pain: Secondary | ICD-10-CM

## 2011-03-30 MED ORDER — PEG-KCL-NACL-NASULF-NA ASC-C 100 G PO SOLR: 1.0000 | Freq: Once | ORAL | Status: DC

## 2011-03-30 NOTE — Patient Instructions (Addendum)
Yo have been scheduled for a Colonoscopy. See separate instructions.  Pick up your prep kit from your pharmacy.  cc: Danise Edge, MD

## 2011-03-30 NOTE — Progress Notes (Signed)
History of Present Illness: This is a 70 year old female who relates 3 separate episodes of crampy lower abdominal pain and severe pelvic/vaginal pain associated with diarrhea. An episode happened about 6 years ago it lasted less than 24 hours. She had 2 episodes of her over the past 4 months again lasting less than 24-hours.  In between these episodes she has no gastrointestinal complaints. She took Pepto-Bismol her diarrhea during these episodes and noted black stools for a day or 2 following several doses of Pepto-Bismol. She was evaluated by her gynecologist she found a prolapsed bladder and prescribed a pessary. Her gynecologist did not feel the severe pain and diarrhea was related to her bladder prolapse. The patient has not previously had colonoscopy. Denies weight loss, constipation, change in stool caliber, melena, hematochezia, nausea, vomiting, dysphagia, reflux symptoms, chest pain.  Review of Systems: Pertinent positive and negative review of systems were noted in the above HPI section. All other review of systems were otherwise negative.  Current Medications, Allergies, Past Medical History, Past Surgical History, Family History and Social History were reviewed in [employer] record.  Physical Exam: General: Well developed , well nourished, no acute distress Head: Normocephalic and atraumatic Eyes:  sclerae anicteric, EOMI Ears: Normal auditory acuity Mouth: No deformity or lesions Neck: Supple, no masses or thyromegaly Lungs: Clear throughout to auscultation Heart: Regular rate and rhythm; no murmurs, rubs or bruits Abdomen: Soft, non tender and non distended. No masses, hepatosplenomegaly or hernias noted. Normal Bowel sounds Rectal: Deferred to colonoscopy  Musculoskeletal: Symmetrical with no gross deformities  Skin: No lesions on visible extremities Pulses:  Normal pulses noted Extremities: No clubbing, cyanosis, edema or deformities noted Neurological:  Alert oriented x 4, grossly nonfocal Cervical Nodes:  No significant cerv.ical adenopathy Inguinal Nodes: No significant inguinal adenopathy Psychological:  Alert and cooperative. Normal mood and affect  Assessment and Recommendations:  1. Episodic, severe vaginal/pelvic abdominal pain associated with crampy lower and diarrhea. She may have had separate episodes of self-limited diarrhea. The dark stools are likely related to Pepto-Bismol and I advised her to use Kaopectate or Imodium and discontinue Pepto-Bismol. Obtain stool Hemoccults. The cause of her severe vaginal pain is not clear but she may have  experienced referred pain from her gastrointestinal tract. The risks, benefits, and alternatives to colonoscopy with possible biopsy and possible polypectomy were discussed with the patient and they consent to proceed.   2. Bladder prolapse treated with a pessary.

## 2011-03-31 ENCOUNTER — Other Ambulatory Visit: Payer: Self-pay | Admitting: Family Medicine

## 2011-03-31 ENCOUNTER — Ambulatory Visit: Payer: Medicare Other | Admitting: Family Medicine

## 2011-03-31 DIAGNOSIS — Z1231 Encounter for screening mammogram for malignant neoplasm of breast: Secondary | ICD-10-CM

## 2011-04-04 ENCOUNTER — Encounter: Payer: Self-pay | Admitting: Family Medicine

## 2011-04-04 DIAGNOSIS — J40 Bronchitis, not specified as acute or chronic: Secondary | ICD-10-CM

## 2011-04-04 HISTORY — DX: Bronchitis, not specified as acute or chronic: J40

## 2011-04-04 NOTE — Assessment & Plan Note (Signed)
Has improved, is scheduled with Gastroenterology soon.

## 2011-04-04 NOTE — Assessment & Plan Note (Signed)
Adequately controlled, no changes to regimen

## 2011-04-04 NOTE — Assessment & Plan Note (Signed)
Patient given rx of Azithromycin and asked to start Mucinex and up fluid intake call if syptoms do not improve.

## 2011-04-04 NOTE — Progress Notes (Signed)
Jasmine Wilson [id number] [birth year] 04/04/2011      Progress Note-Follow Up  Subjective  Chief Complaint  Chief Complaint  Patient presents with  . Cough    X 3 days  . Sinusitis    X 3 days  . Otitis Media    X 2 days- both ears    HPI  Patient is a 70 yo female in today with c/o 3 days of increased cough, productive of thick colored sputum. Increased nasal congestion, PND, nausea, anorexia. Ears have also felt full and pained for past several days as well. Denies any fevers/chills/CP/palp/SOB/wheeze. Has not had any severe pelvic or abdominal pain recently.  Past Medical History  Diagnosis Date  . Meniere's disease   . Hearing aid worn   . Chicken pox as a child  . Shingles   . Measles as a child  . Hyperlipidemia   . Female bladder prolapse, acquired 01/29/2011  . Preventative health care 01/29/2011  . HTN (hypertension) 01/29/2011  . Glaucoma 01/29/2011  . Abdominal pain 03/02/2011  . History of pneumonia   . Bronchitis 04/04/2011    Past Surgical History  Procedure Date  . Lump removed on left breast     clogged milk duct, benign  . Tubal ligation     Family History  Problem Relation Age of Onset  . Breast cancer Mother   . Stroke Father   . Heart disease Father   . Hyperlipidemia Father   . Hypertension Father   . Breast cancer Sister 54  . Uterine cancer Maternal Grandmother     uterus    History   Social History  . Marital Status: Widowed    Spouse Name: N/A    Number of Children: 3  . Years of Education: N/A   Occupational History  . Retired    Social History Main Topics  . Smoking status: Former Smoker    Types: Cigarettes    Quit date: 06/21/1970  . Smokeless tobacco: Never Used   Comment: occasional smoker  . Alcohol Use: No  . Drug Use: No  . Sexually Active: No   Other Topics Concern  . Not on file   Social History Narrative  . No narrative on file    Current Outpatient Prescriptions on File Prior to Visit  Medication Sig  Dispense Refill  . dorzolamide (TRUSOPT) 2 % ophthalmic solution       . fish oil-omega-3 fatty acids 1000 MG capsule Take 2 g by mouth daily.        Marland Kitchen KLOR-CON M20 20 MEQ tablet Take 20 mEq by mouth daily.       Marland Kitchen latanoprost (XALATAN) 0.005 % ophthalmic solution       . metoprolol tartrate (LOPRESSOR) 25 MG tablet Take 1 tablet (25 mg total) by mouth daily after supper.  30 tablet  5  . Red Yeast Rice Extract (RED YEAST RICE PO) Take by mouth.        . timolol (TIMOPTIC) 0.5 % ophthalmic solution       . triamterene-hydrochlorothiazide (MAXZIDE-25) 37.5-25 MG per tablet Take 1 tablet by mouth daily.  90 tablet  3    No Known Allergies  Review of Systems  Review of Systems  Constitutional: Negative for fever, chills and malaise/fatigue.  HENT: Positive for ear pain and congestion. Negative for sore throat and ear discharge.   Eyes: Negative for discharge.  Respiratory: Positive for cough and sputum production. Negative for shortness of breath and wheezing.  Cardiovascular: Negative for chest pain, palpitations and leg swelling.  Gastrointestinal: Negative for nausea, abdominal pain and diarrhea.  Genitourinary: Negative for dysuria.  Musculoskeletal: Negative for falls.  Skin: Negative for rash.  Neurological: Negative for loss of consciousness, weakness and headaches.  Endo/Heme/Allergies: Negative for polydipsia.  Psychiatric/Behavioral: Negative for depression and suicidal ideas. The patient is not nervous/anxious and does not have insomnia.     Objective  BP 125/83  Pulse 92  Temp(Src) 98 F (36.7 C) (Oral)  Ht 5' 7.25" (1.708 m)  Wt 176 lb 12.8 oz (80.196 kg)  BMI 27.49 kg/m2  SpO2 94%  Physical Exam  Physical Exam  Constitutional: She is oriented to person, place, and time and well-developed, well-nourished, and in no distress. No distress.  HENT:  Head: Normocephalic and atraumatic.  Eyes: Conjunctivae are normal.  Neck: Neck supple. No thyromegaly present.    Cardiovascular: Normal rate, regular rhythm and normal heart sounds.   No murmur heard. Pulmonary/Chest: Effort normal and breath sounds normal. She has no wheezes.  Abdominal: She exhibits no distension and no mass.  Musculoskeletal: She exhibits no edema.  Lymphadenopathy:    She has no cervical adenopathy.  Neurological: She is alert and oriented to person, place, and time.  Skin: Skin is warm and dry. No rash noted. She is not diaphoretic.  Psychiatric: Memory, affect and judgment normal.    Lab Results  Component Value Date   TSH 0.819 02/25/2011   Lab Results  Component Value Date   WBC 6.8 02/25/2011   HGB 14.0 02/25/2011   HCT 43.5 02/25/2011   MCV 91.6 02/25/2011   PLT 381 02/25/2011   Lab Results  Component Value Date   CREATININE 0.75 02/25/2011   BUN 13 02/25/2011   NA 138 02/25/2011   K 4.2 02/25/2011   CL 102 02/25/2011   CO2 24 02/25/2011   Lab Results  Component Value Date   ALT 13 02/25/2011   AST 15 02/25/2011   ALKPHOS 71 02/25/2011   BILITOT 0.3 02/25/2011   Lab Results  Component Value Date   CHOL 235* 02/25/2011   Lab Results  Component Value Date   HDL 56 02/25/2011   Lab Results  Component Value Date   LDLCALC 129* 02/25/2011   Lab Results  Component Value Date   TRIG 249* 02/25/2011   Lab Results  Component Value Date   CHOLHDL 4.2 02/25/2011     Assessment & Plan  Bronchitis Patient given rx of Azithromycin and asked to start Mucinex and up fluid intake call if syptoms do not improve.  Abdominal pain Has improved, is scheduled with Gastroenterology soon.  HTN (hypertension) Adequately controlled, no changes to regimen

## 2011-04-07 ENCOUNTER — Ambulatory Visit: Payer: Medicare Other | Admitting: Family Medicine

## 2011-04-20 ENCOUNTER — Ambulatory Visit (INDEPENDENT_AMBULATORY_CARE_PROVIDER_SITE_OTHER): Payer: Medicare Other | Admitting: Obstetrics & Gynecology

## 2011-04-20 ENCOUNTER — Encounter: Payer: Self-pay | Admitting: Obstetrics & Gynecology

## 2011-04-20 VITALS — BP 136/77 | HR 81 | Temp 98.4°F | Resp 17 | Ht 68.0 in | Wt 174.0 lb

## 2011-04-20 DIAGNOSIS — N8111 Cystocele, midline: Secondary | ICD-10-CM

## 2011-04-20 DIAGNOSIS — IMO0002 Reserved for concepts with insufficient information to code with codable children: Secondary | ICD-10-CM

## 2011-04-20 NOTE — Progress Notes (Signed)
  Subjective:    Patient ID: Jasmine Wilson, female    DOB: 10-22-1940, 70 y.o.   MRN: [id number]  HPI  Jasmine Wilson was seen here by me about a month ago with the complaint of vaginal pain "like someone is sticking a stick up there" along with a 20 year history of a cystocele.  Her other complaint that day was of episodic painful diarrhea.  I treated her cystocele with a #3 ring pessary.  She has had no difficulties removing it nightly and replacing it every morning.  She has not felt the vaginal pain since having the pessary.  She will be having her first colonoscopy this Friday.  Review of Systems She declines a flu shot.    Objective:   Physical Exam  Normal appearance of her vaginal mucosa, no tears or abrasions, minimal atrophy noted.     Assessment & Plan:  Cystocele-resolved with a pessary. RTC for annual in 1 year.

## 2011-04-21 ENCOUNTER — Ambulatory Visit
Admission: RE | Admit: 2011-04-21 | Discharge: 2011-04-21 | Disposition: A | Discharge status: Home / Self Care | Payer: Medicare Other | Source: Ambulatory Visit | Attending: Family Medicine | Admitting: Family Medicine

## 2011-04-21 DIAGNOSIS — Z1231 Encounter for screening mammogram for malignant neoplasm of breast: Secondary | ICD-10-CM

## 2011-04-23 ENCOUNTER — Encounter: Payer: Self-pay | Admitting: Gastroenterology

## 2011-04-23 ENCOUNTER — Ambulatory Visit (AMBULATORY_SURGERY_CENTER): Payer: Medicare Other | Admitting: Gastroenterology

## 2011-04-23 DIAGNOSIS — D126 Benign neoplasm of colon, unspecified: Secondary | ICD-10-CM

## 2011-04-23 DIAGNOSIS — R197 Diarrhea, unspecified: Secondary | ICD-10-CM

## 2011-04-23 DIAGNOSIS — R109 Unspecified abdominal pain: Secondary | ICD-10-CM

## 2011-04-23 MED ORDER — SODIUM CHLORIDE 0.9 % IV SOLN: 500.0000 mL | INTRAVENOUS | Status: DC

## 2011-04-23 NOTE — Progress Notes (Signed)
Pt had cramping with the scope advancement.  Medication was ordered by Dr. Russella Dar.  Once the cecum was reached and the scope was being with drawn, the pt relax and rested with her eyes closed, comfortably. maw

## 2011-04-23 NOTE — Patient Instructions (Signed)
Please refer to your blue and neon green sheets for instructions regarding diet and activity for the rest of today.  You may resume your medications as you would normally take them.   You will receive a letter in the mail in about two weeks regarding the results of the biopsies taken today.  Diverticulosis Diverticulosis is a common condition that develops when small pouches (diverticula) form in the wall of the colon. The risk of diverticulosis increases with age. It happens more often in people who eat a low-fiber diet. Most individuals with diverticulosis have no symptoms. Those individuals with symptoms usually experience abdominal pain, constipation, or loose stools (diarrhea). HOME CARE INSTRUCTIONS   Increase the amount of fiber in your diet as directed by your caregiver or dietician. This may reduce symptoms of diverticulosis.   Your caregiver may recommend taking a dietary fiber supplement.   Drink at least 6 to 8 glasses of water each day to prevent constipation.   Try not to strain when you have a bowel movement.   Your caregiver may recommend avoiding nuts and seeds to prevent complications, although this is still an uncertain benefit.   Only take over-the-counter or prescription medicines for pain, discomfort, or fever as directed by your caregiver.  FOODS WITH HIGH FIBER CONTENT INCLUDE:  Fruits. Apple, peach, pear, tangerine, raisins, prunes.   Vegetables. Brussels sprouts, asparagus, broccoli, cabbage, carrot, cauliflower, romaine lettuce, spinach, summer squash, tomato, winter squash, zucchini.   Starchy Vegetables. Baked beans, kidney beans, lima beans, split peas, lentils, potatoes (with skin).   Grains. Whole wheat bread, brown rice, bran flake cereal, plain oatmeal, white rice, shredded wheat, bran muffins.  SEEK IMMEDIATE MEDICAL CARE IF:   You develop increasing pain or severe bloating.   You have an oral temperature above 102 F (38.9 C), not controlled by  medicine.   You develop vomiting or bowel movements that are bloody or black.  Document Released: 03/04/2004 Document Revised: 02/17/2011 Document Reviewed: 11/05/2009 [employer] Patient Information 2012 [employer].  Colon Polyps A polyp is extra tissue that grows inside your body. Colon polyps grow in the large intestine. The large intestine, also called the colon, is part of your digestive system. It is a long, hollow tube at the end of your digestive tract where your body makes and stores stool. Most polyps are not dangerous. They are benign. This means they are not cancerous. But over time, some types of polyps can turn into cancer. Polyps that are smaller than a pea are usually not harmful. But larger polyps could someday become or may already be cancerous. To be safe, doctors remove all polyps and test them.  WHO GETS POLYPS? Anyone can get polyps, but certain people are more likely than others. You may have a greater chance of getting polyps if:  You are over 50.   You have had polyps before.   Someone in your family has had polyps.   Someone in your family has had cancer of the large intestine.   Find out if someone in your family has had polyps. You may also be more likely to get polyps if you:   Eat a lot of fatty foods.   Smoke.   Drink alcohol.   Do not exercise.   Eat too much.  SYMPTOMS  Most small polyps do not cause symptoms. People often do not know they have one until their caregiver finds it during a regular checkup or while testing them for something else. Some people do   have symptoms like these:  Bleeding from the anus. You might notice blood on your underwear or on toilet paper after you have had a bowel movement.   Constipation or diarrhea that lasts more than a week.   Blood in the stool. Blood can make stool look black or it can show up as red streaks in the stool.  If you have any of these symptoms, see your caregiver. HOW DOES THE DOCTOR TEST FOR  POLYPS? The doctor can use four tests to check for polyps:  Digital rectal exam. The caregiver wears gloves and checks your rectum (the last part of the large intestine) to see if it feels normal. This test would find polyps only in the rectum. Your caregiver may need to do one of the other tests listed below to find polyps higher up in the intestine.   Barium enema. The caregiver puts a liquid called barium into your rectum before taking x-rays of your large intestine. Barium makes your intestine look white in the pictures. Polyps are dark, so they are easy to see.   Sigmoidoscopy. With this test, the caregiver can see inside your large intestine. A thin flexible tube is placed into your rectum. The device is called a sigmoidoscope, which has a light and a tiny video camera in it. The caregiver uses the sigmoidoscope to look at the last third of your large intestine.   Colonoscopy. This test is like sigmoidoscopy, but the caregiver looks at all of the large intestine. It usually requires sedation. This is the most common method for finding and removing polyps.  TREATMENT   The caregiver will remove the polyp during sigmoidoscopy or colonoscopy. The polyp is then tested for cancer.   If you have had polyps, your caregiver may want you to get tested regularly in the future.  PREVENTION  There is not one sure way to prevent polyps. You might be able to lower your risk of getting them if you:  Eat more fruits and vegetables and less fatty food.   Do not smoke.   Avoid alcohol.   Exercise every day.   Lose weight if you are overweight.   Eating more calcium and folate can also lower your risk of getting polyps. Some foods that are rich in calcium are milk, cheese, and broccoli. Some foods that are rich in folate are chickpeas, kidney beans, and spinach.   Aspirin might help prevent polyps. Studies are under way.  Document Released: 03/03/2004 Document Revised: 02/17/2011 Document Reviewed:  08/09/2007 [employer] Patient Information 2012 [employer]. 

## 2011-04-26 ENCOUNTER — Telehealth: Payer: Self-pay | Admitting: *Deleted

## 2011-04-26 NOTE — Telephone Encounter (Signed)

## 2011-04-27 ENCOUNTER — Encounter: Payer: Self-pay | Admitting: Gastroenterology

## 2011-07-29 ENCOUNTER — Telehealth: Payer: Self-pay | Admitting: Family Medicine

## 2011-07-29 DIAGNOSIS — I10 Essential (primary) hypertension: Secondary | ICD-10-CM

## 2011-07-29 MED ORDER — TRIAMTERENE-HCTZ 37.5-25 MG PO TABS: 1.0000 | ORAL_TABLET | Freq: Every day | ORAL | Status: DC

## 2011-07-29 MED ORDER — METOPROLOL TARTRATE 25 MG PO TABS: 25.0000 mg | ORAL_TABLET | Freq: Every day | ORAL | Status: DC

## 2011-07-29 MED ORDER — POTASSIUM CHLORIDE CRYS ER 20 MEQ PO TBCR: 20.0000 meq | EXTENDED_RELEASE_TABLET | Freq: Every day | ORAL | Status: DC

## 2011-07-29 NOTE — Telephone Encounter (Signed)
Pt informed that I could send medication through the computer. Pt informed me that they need to be sent to [facility]. I will send these to pharmacy.

## 2011-07-29 NOTE — Telephone Encounter (Signed)
Patient has switched insurance companies, she needs to mail her 90 day Rx in for Klorcon Triamterene & metoprolol. Please call her when they are ready to be picked up.

## 2011-08-09 ENCOUNTER — Other Ambulatory Visit: Payer: Self-pay | Admitting: Family Medicine

## 2011-08-11 ENCOUNTER — Other Ambulatory Visit: Payer: Self-pay

## 2011-08-11 ENCOUNTER — Other Ambulatory Visit: Payer: Self-pay | Admitting: Family Medicine

## 2011-08-11 DIAGNOSIS — I10 Essential (primary) hypertension: Secondary | ICD-10-CM

## 2011-08-11 MED ORDER — METOPROLOL TARTRATE 25 MG PO TABS: 25.0000 mg | ORAL_TABLET | Freq: Every day | ORAL | Status: DC

## 2011-08-11 MED ORDER — POTASSIUM CHLORIDE CRYS ER 20 MEQ PO TBCR: 20.0000 meq | EXTENDED_RELEASE_TABLET | Freq: Every day | ORAL | Status: DC

## 2011-08-11 MED ORDER — TRIAMTERENE-HCTZ 37.5-25 MG PO TABS: 1.0000 | ORAL_TABLET | Freq: Every day | ORAL | Status: DC

## 2011-10-07 ENCOUNTER — Telehealth: Payer: Self-pay

## 2011-10-07 NOTE — Telephone Encounter (Signed)
Left a detailed message on patients cell phone stating that paperwork ([facility] medical history) was ready to be picked up at [facility].

## 2011-10-18 ENCOUNTER — Ambulatory Visit (INDEPENDENT_AMBULATORY_CARE_PROVIDER_SITE_OTHER): Payer: Medicare Other | Admitting: Family Medicine

## 2011-10-18 ENCOUNTER — Encounter: Payer: Self-pay | Admitting: Family Medicine

## 2011-10-18 VITALS — BP 145/88 | HR 101 | Temp 98.2°F | Ht 68.0 in | Wt 176.8 lb

## 2011-10-18 DIAGNOSIS — J40 Bronchitis, not specified as acute or chronic: Secondary | ICD-10-CM

## 2011-10-18 DIAGNOSIS — I10 Essential (primary) hypertension: Secondary | ICD-10-CM

## 2011-10-18 DIAGNOSIS — J329 Chronic sinusitis, unspecified: Secondary | ICD-10-CM

## 2011-10-18 MED ORDER — HYDROCOD POLST-CHLORPHEN POLST 10-8 MG/5ML PO LQCR: 5.0000 mL | Freq: Every evening | ORAL | Status: DC | PRN

## 2011-10-18 MED ORDER — GUAIFENESIN ER 600 MG PO TB12: 1200.0000 mg | ORAL_TABLET | Freq: Two times a day (BID) | ORAL | Status: DC

## 2011-10-18 MED ORDER — AMOXICILLIN-POT CLAVULANATE 875-125 MG PO TABS: 1.0000 | ORAL_TABLET | Freq: Two times a day (BID) | ORAL | Status: AC

## 2011-10-18 NOTE — Patient Instructions (Signed)

## 2011-10-21 ENCOUNTER — Encounter: Payer: Self-pay | Admitting: Family Medicine

## 2011-10-21 NOTE — Assessment & Plan Note (Addendum)
And sinusitis, start Augmentin, given cough syrup. Mucinex bid. Probiotics daily

## 2011-10-21 NOTE — Progress Notes (Signed)
Patient ID: Jasmine Wilson, female   DOB: 01/11/1941, 71 y.o.   MRN: [id number] Jasmine Wilson [id number] [birth year] 10/21/2011      Progress Note-Follow Up  Subjective  Chief Complaint  Chief Complaint  Patient presents with  . Cough    x 3 days  . Nasal Congestion    X 3 days    HPI  Patient is a 71 year old Caucasian female who is in today for evaluation of 24 days of worsening cough and congestion. She has a cough and rhinorrhea productive of yellow phlegm. She is postnasal drip, headache, ear pain and fatigue. She notes anorexia. Her cough is bad enough to keep her up at night. She's tried some over-the-counter antihistamine such as Benadryl without any significant relief. No fevers, chills, shortness of breath, chest pain, diarrhea, nausea, GU complaints noted  Past Medical History  Diagnosis Date  . Meniere's disease   . Hearing aid worn   . Chicken pox as a child  . Shingles   . Measles as a child  . Hyperlipidemia   . Female bladder prolapse, acquired 01/29/2011  . Preventative health care 01/29/2011  . HTN (hypertension) 01/29/2011  . Glaucoma 01/29/2011  . Abdominal pain 03/02/2011  . History of pneumonia   . Bronchitis 04/04/2011  . Cataract     Past Surgical History  Procedure Date  . Lump removed on left breast     clogged milk duct, benign  . Tubal ligation     Family History  Problem Relation Age of Onset  . Breast cancer Mother   . Stroke Father   . Heart disease Father   . Hyperlipidemia Father   . Hypertension Father   . Breast cancer Sister 7  . Uterine cancer Maternal Grandmother     uterus    History   Social History  . Marital Status: Widowed    Spouse Name: N/A    Number of Children: 3  . Years of Education: N/A   Occupational History  . Retired    Social History Main Topics  . Smoking status: Former Smoker    Types: Cigarettes    Quit date: 06/21/1970  . Smokeless tobacco: Never Used   Comment: occasional smoker  . Alcohol Use:  No  . Drug Use: No  . Sexually Active: No   Other Topics Concern  . Not on file   Social History Narrative  . No narrative on file    Current Outpatient Prescriptions on File Prior to Visit  Medication Sig Dispense Refill  . dorzolamide (TRUSOPT) 2 % ophthalmic solution       . fish oil-omega-3 fatty acids 1000 MG capsule Take 2 g by mouth daily.        Marland Kitchen latanoprost (XALATAN) 0.005 % ophthalmic solution       . metoprolol tartrate (LOPRESSOR) 25 MG tablet Take 1 tablet (25 mg total) by mouth daily after supper. Must have appt for more refills.  30 tablet  0  . potassium chloride SA (KLOR-CON M20) 20 MEQ tablet Take 1 tablet (20 mEq total) by mouth daily.  90 tablet  1  . Red Yeast Rice Extract (RED YEAST RICE PO) Take by mouth.        . timolol (TIMOPTIC) 0.5 % ophthalmic solution       . triamterene-hydrochlorothiazide (MAXZIDE-25) 37.5-25 MG per tablet Take 1 each (1 tablet total) by mouth daily.  90 tablet  1   Current Facility-Administered Medications on File Prior to Visit  Medication Dose Route Frequency Provider Last Rate Last Dose  . 0.9 %  sodium chloride infusion  500 mL Intravenous Continuous Meryl Dare, MD,FACG        No Known Allergies  Review of Systems  Review of Systems  Constitutional: Positive for malaise/fatigue. Negative for fever and chills.  HENT: Positive for ear pain and congestion.   Eyes: Negative for discharge.  Respiratory: Positive for cough. Negative for shortness of breath.   Cardiovascular: Negative for chest pain, palpitations and leg swelling.  Gastrointestinal: Negative for nausea, abdominal pain and diarrhea.  Genitourinary: Negative for dysuria.  Musculoskeletal: Negative for falls.  Skin: Negative for rash.  Neurological: Positive for headaches. Negative for loss of consciousness.  Endo/Heme/Allergies: Negative for polydipsia.  Psychiatric/Behavioral: Negative for depression and suicidal ideas. The patient is not nervous/anxious  and does not have insomnia.     Objective  BP 145/88  Pulse 101  Temp(Src) 98.2 F (36.8 C) (Temporal)  Ht 5\' 8"  (1.727 m)  Wt 176 lb 12.8 oz (80.196 kg)  BMI 26.88 kg/m2  SpO2 97%  Physical Exam  Physical Exam  Constitutional: She is oriented to person, place, and time and well-developed, well-nourished, and in no distress. No distress.  HENT:  Head: Normocephalic and atraumatic.       Posterior oropharynx erythematous. TMs mildly erythematous  Eyes: Conjunctivae are normal.  Neck: Neck supple. No thyromegaly present.  Cardiovascular: Normal rate, regular rhythm and normal heart sounds.   No murmur heard. Pulmonary/Chest: Effort normal and breath sounds normal. She has no wheezes.  Abdominal: She exhibits no distension and no mass.  Musculoskeletal: She exhibits no edema.  Lymphadenopathy:    She has cervical adenopathy.  Neurological: She is alert and oriented to person, place, and time.  Skin: Skin is warm and dry. No rash noted. She is not diaphoretic.  Psychiatric: Memory, affect and judgment normal.    Lab Results  Component Value Date   TSH 0.819 02/25/2011   Lab Results  Component Value Date   WBC 6.8 02/25/2011   HGB 14.0 02/25/2011   HCT 43.5 02/25/2011   MCV 91.6 02/25/2011   PLT 381 02/25/2011   Lab Results  Component Value Date   CREATININE 0.75 02/25/2011   BUN 13 02/25/2011   NA 138 02/25/2011   K 4.2 02/25/2011   CL 102 02/25/2011   CO2 24 02/25/2011   Lab Results  Component Value Date   ALT 13 02/25/2011   AST 15 02/25/2011   ALKPHOS 71 02/25/2011   BILITOT 0.3 02/25/2011   Lab Results  Component Value Date   CHOL 235* 02/25/2011   Lab Results  Component Value Date   HDL 56 02/25/2011   Lab Results  Component Value Date   LDLCALC 129* 02/25/2011   Lab Results  Component Value Date   TRIG 249* 02/25/2011   Lab Results  Component Value Date   CHOLHDL 4.2 02/25/2011     Assessment & Plan  Bronchitis And sinusitis, start Augmentin, given cough syrup. Mucinex  bid. Probiotics daily  HTN (hypertension) Adequately controlled despite acute illness

## 2011-10-21 NOTE — Assessment & Plan Note (Signed)
Adequately controlled despite acute illness 

## 2012-01-31 ENCOUNTER — Other Ambulatory Visit: Payer: Self-pay

## 2012-01-31 MED ORDER — POTASSIUM CHLORIDE CRYS ER 20 MEQ PO TBCR: 20.0000 meq | EXTENDED_RELEASE_TABLET | Freq: Every day | ORAL | Status: DC

## 2012-02-24 ENCOUNTER — Encounter: Payer: Self-pay | Admitting: Family Medicine

## 2012-02-24 ENCOUNTER — Ambulatory Visit (INDEPENDENT_AMBULATORY_CARE_PROVIDER_SITE_OTHER): Payer: Medicare Other | Admitting: Family Medicine

## 2012-02-24 VITALS — BP 138/81 | HR 78 | Temp 97.6°F | Ht 68.0 in | Wt 169.8 lb

## 2012-02-24 DIAGNOSIS — Z23 Encounter for immunization: Secondary | ICD-10-CM

## 2012-02-24 DIAGNOSIS — J40 Bronchitis, not specified as acute or chronic: Secondary | ICD-10-CM

## 2012-02-24 DIAGNOSIS — R229 Localized swelling, mass and lump, unspecified: Secondary | ICD-10-CM

## 2012-02-24 DIAGNOSIS — R2230 Localized swelling, mass and lump, unspecified upper limb: Secondary | ICD-10-CM

## 2012-02-24 DIAGNOSIS — E876 Hypokalemia: Secondary | ICD-10-CM

## 2012-02-24 DIAGNOSIS — Z0000 Encounter for general adult medical examination without abnormal findings: Secondary | ICD-10-CM

## 2012-02-24 DIAGNOSIS — M129 Arthropathy, unspecified: Secondary | ICD-10-CM

## 2012-02-24 DIAGNOSIS — E785 Hyperlipidemia, unspecified: Secondary | ICD-10-CM

## 2012-02-24 DIAGNOSIS — M1990 Unspecified osteoarthritis, unspecified site: Secondary | ICD-10-CM

## 2012-02-24 DIAGNOSIS — I10 Essential (primary) hypertension: Secondary | ICD-10-CM

## 2012-02-24 LAB — RENAL FUNCTION PANEL
CO2: 26 mEq/L (ref 19–32)
Calcium: 9.6 mg/dL (ref 8.4–10.5)
Glucose, Bld: 76 mg/dL (ref 70–99)
Potassium: 4.2 mEq/L (ref 3.5–5.1)
Sodium: 137 mEq/L (ref 135–145)

## 2012-02-24 LAB — CBC
HCT: 43.3 % (ref 36.0–46.0)
Hemoglobin: 14.1 g/dL (ref 12.0–15.0)
RDW: 14.1 % (ref 11.5–14.6)
WBC: 5.8 10*3/uL (ref 4.5–10.5)

## 2012-02-24 LAB — LDL CHOLESTEROL, DIRECT: Direct LDL: 181 mg/dL

## 2012-02-24 LAB — HEPATIC FUNCTION PANEL
ALT: 13 U/L (ref 0–35)
Bilirubin, Direct: 0 mg/dL (ref 0.0–0.3)
Total Bilirubin: 0.6 mg/dL (ref 0.3–1.2)

## 2012-02-24 LAB — LIPID PANEL: HDL: 68.4 mg/dL (ref >39.00)

## 2012-02-24 MED ORDER — TRIAMTERENE-HCTZ 37.5-25 MG PO TABS: 1.0000 | ORAL_TABLET | Freq: Every day | ORAL | Status: DC

## 2012-02-24 MED ORDER — POTASSIUM CHLORIDE CRYS ER 20 MEQ PO TBCR: 20.0000 meq | EXTENDED_RELEASE_TABLET | Freq: Every day | ORAL | Status: DC

## 2012-02-24 MED ORDER — METOPROLOL SUCCINATE ER 25 MG PO TB24: 25.0000 mg | ORAL_TABLET | Freq: Every day | ORAL | Status: DC

## 2012-02-24 NOTE — Patient Instructions (Addendum)

## 2012-03-02 ENCOUNTER — Encounter: Payer: Self-pay | Admitting: Family Medicine

## 2012-03-02 DIAGNOSIS — M1990 Unspecified osteoarthritis, unspecified site: Secondary | ICD-10-CM | POA: Insufficient documentation

## 2012-03-02 HISTORY — DX: Unspecified osteoarthritis, unspecified site: M19.90

## 2012-03-02 NOTE — Progress Notes (Signed)
Patient ID: Jasmine Wilson, female   DOB: [birth year], 71 y.o.   MRN: [id number] MARISELA SHARBAUGH [id number] 1940-11-26 03/02/2012      Progress Note-Follow Up  Subjective  Chief Complaint  Chief Complaint  Patient presents with  . Annual Exam    physical    HPI  She is a 71 year old Caucasian female who is in today for followup. Overall she's doing well. She is noting no recent illness. She did go to see gynecology and was fitted with a pessary. She's been very pleased with his results. She's not having as much trouble with incontinence or prolapse. She's having some stiffness and pain in her fingers. She's a "hurt notes that she can have significant discomfort. No other acute complaints. No recent illness, fevers, chills, abdominal pain, chest pain, palpitations, shortness of breath, GI or GU complaints. No recent flares in her vertigo.  Past Medical History  Diagnosis Date  . Meniere's disease   . Hearing aid worn   . Chicken pox as a child  . Shingles   . Measles as a child  . Hyperlipidemia   . Female bladder prolapse, acquired 01/29/2011  . Preventative health care 01/29/2011  . HTN (hypertension) 01/29/2011  . Glaucoma 01/29/2011  . Abdominal pain 03/02/2011  . History of pneumonia   . Bronchitis 04/04/2011  . Cataract   . Arthritis 03/02/2012    Past Surgical History  Procedure Date  . Lump removed on left breast     clogged milk duct, benign  . Tubal ligation     Family History  Problem Relation Age of Onset  . Breast cancer Mother   . Stroke Father   . Heart disease Father   . Hyperlipidemia Father   . Hypertension Father   . Breast cancer Sister 44  . Uterine cancer Maternal Grandmother     uterus    History   Social History  . Marital Status: Widowed    Spouse Name: N/A    Number of Children: 3  . Years of Education: N/A   Occupational History  . Retired    Social History Main Topics  . Smoking status: Former Smoker    Types: Cigarettes    Quit date:  06/21/1970  . Smokeless tobacco: Never Used   Comment: occasional smoker  . Alcohol Use: No  . Drug Use: No  . Sexually Active: No   Other Topics Concern  . Not on file   Social History Narrative  . No narrative on file    Current Outpatient Prescriptions on File Prior to Visit  Medication Sig Dispense Refill  . dorzolamide (TRUSOPT) 2 % ophthalmic solution       . latanoprost (XALATAN) 0.005 % ophthalmic solution       . potassium chloride SA (KLOR-CON M20) 20 MEQ tablet Take 1 tablet (20 mEq total) by mouth daily.  90 tablet  2  . Red Yeast Rice Extract (RED YEAST RICE PO) Take by mouth.        . timolol (TIMOPTIC) 0.5 % ophthalmic solution       . triamterene-hydrochlorothiazide (MAXZIDE-25) 37.5-25 MG per tablet Take 1 each (1 tablet total) by mouth daily. Take in am  90 tablet  3  . metoprolol succinate (TOPROL-XL) 25 MG 24 hr tablet Take 1 tablet (25 mg total) by mouth daily. Take in pm Replaces the Tartrate  30 tablet  1   Current Facility-Administered Medications on File Prior to Visit  Medication Dose Route Frequency Provider  Last Rate Last Dose  . 0.9 %  sodium chloride infusion  500 mL Intravenous Continuous Meryl Dare, MD,FACG        No Known Allergies  Review of Systems  Review of Systems  Constitutional: Negative for fever and malaise/fatigue.  HENT: Negative for congestion.   Eyes: Negative for discharge.  Respiratory: Negative for shortness of breath.   Cardiovascular: Negative for chest pain, palpitations and leg swelling.  Gastrointestinal: Negative for nausea, abdominal pain and diarrhea.  Genitourinary: Negative for dysuria.  Musculoskeletal: Negative for falls.  Skin: Negative for rash.  Neurological: Negative for loss of consciousness and headaches.  Endo/Heme/Allergies: Negative for polydipsia.  Psychiatric/Behavioral: Negative for depression and suicidal ideas. The patient is not nervous/anxious and does not have insomnia.      Objective  BP 138/81  Pulse 78  Temp 97.6 F (36.4 C) (Temporal)  Ht 5\' 8"  (1.727 m)  Wt 169 lb 12.8 oz (77.021 kg)  BMI 25.82 kg/m2  SpO2 98%  Physical Exam  Physical Exam  Constitutional: She is oriented to person, place, and time and well-developed, well-nourished, and in no distress. No distress.  HENT:  Head: Normocephalic and atraumatic.  Eyes: Conjunctivae normal are normal.  Neck: Neck supple. No thyromegaly present.  Cardiovascular: Normal rate, regular rhythm and normal heart sounds.   No murmur heard. Pulmonary/Chest: Effort normal and breath sounds normal. She has no wheezes.  Abdominal: She exhibits no distension and no mass.  Musculoskeletal: She exhibits no edema.  Lymphadenopathy:    She has no cervical adenopathy.  Neurological: She is alert and oriented to person, place, and time.  Skin: Skin is warm and dry. No rash noted. She is not diaphoretic.  Psychiatric: Memory, affect and judgment normal.    Lab Results  Component Value Date   TSH 1.01 02/24/2012   Lab Results  Component Value Date   WBC 5.8 02/24/2012   HGB 14.1 02/24/2012   HCT 43.3 02/24/2012   MCV 91.9 02/24/2012   PLT 290.0 02/24/2012   Lab Results  Component Value Date   CREATININE 0.9 02/24/2012   BUN 13 02/24/2012   NA 137 02/24/2012   K 4.2 02/24/2012   CL 102 02/24/2012   CO2 26 02/24/2012   Lab Results  Component Value Date   ALT 13 02/24/2012   AST 16 02/24/2012   ALKPHOS 65 02/24/2012   BILITOT 0.6 02/24/2012   Lab Results  Component Value Date   CHOL 290* 02/24/2012   Lab Results  Component Value Date   HDL 68.40 02/24/2012   Lab Results  Component Value Date   LDLCALC 129* 02/25/2011   Lab Results  Component Value Date   TRIG 231.0* 02/24/2012   Lab Results  Component Value Date   CHOLHDL 4 02/24/2012     Assessment & Plan  Arthritis Worst in hands, add MegaRed and try Aspercreme  Bronchitis Resolved feels well  HTN (hypertension) Well controlled, no  changes.  Hyperlipidemia Patient hesitant to take statin, she will call if she changes her mind, avoid trans fats, add MegaRed continue red yeast rice  Preventative health care Patient continues to decline colonoscopy and statins. Did proceed with pap and MGM

## 2012-03-02 NOTE — Assessment & Plan Note (Signed)
Patient hesitant to take statin, she will call if she changes her mind, avoid trans fats, add MegaRed continue red yeast rice

## 2012-03-02 NOTE — Assessment & Plan Note (Signed)
Patient continues to decline colonoscopy and statins. Did proceed with pap and MGM

## 2012-03-02 NOTE — Assessment & Plan Note (Signed)
Resolved feels well

## 2012-03-02 NOTE — Assessment & Plan Note (Signed)
Well controlled, no changes 

## 2012-03-02 NOTE — Assessment & Plan Note (Signed)
Worst in hands, add MegaRed and try Aspercreme

## 2012-03-07 ENCOUNTER — Encounter: Payer: Self-pay | Admitting: Family Medicine

## 2012-03-14 ENCOUNTER — Telehealth: Payer: Self-pay

## 2012-03-14 NOTE — Telephone Encounter (Signed)
Pt called asking which medications (maxzide or metoprolol) should be taken when.  After discussing with MD I informed pt that Maxzide is to be taken in the am and Metoprolol taken at night.

## 2012-03-22 ENCOUNTER — Other Ambulatory Visit: Payer: Self-pay

## 2012-03-22 DIAGNOSIS — I10 Essential (primary) hypertension: Secondary | ICD-10-CM

## 2012-03-22 MED ORDER — METOPROLOL SUCCINATE ER 25 MG PO TB24: 25.0000 mg | ORAL_TABLET | Freq: Every day | ORAL | Status: DC

## 2012-03-22 MED ORDER — TRIAMTERENE-HCTZ 37.5-25 MG PO TABS: 1.0000 | ORAL_TABLET | Freq: Every day | ORAL | Status: DC

## 2012-11-22 ENCOUNTER — Other Ambulatory Visit: Payer: Self-pay

## 2012-11-22 DIAGNOSIS — E876 Hypokalemia: Secondary | ICD-10-CM

## 2012-11-22 MED ORDER — POTASSIUM CHLORIDE CRYS ER 20 MEQ PO TBCR: 20.0000 meq | EXTENDED_RELEASE_TABLET | Freq: Every day | ORAL | Status: DC

## 2012-11-22 NOTE — Telephone Encounter (Signed)
90 day supply sent to [facility] per pts request. Pt was also informed that she will need an appt in Sept to continue getting refills. Pt voiced understanding

## 2012-12-02 LAB — HM MAMMOGRAPHY

## 2013-01-29 ENCOUNTER — Ambulatory Visit (INDEPENDENT_AMBULATORY_CARE_PROVIDER_SITE_OTHER): Payer: BC Managed Care – HMO | Admitting: Family Medicine

## 2013-01-29 ENCOUNTER — Ambulatory Visit (HOSPITAL_BASED_OUTPATIENT_CLINIC_OR_DEPARTMENT_OTHER)
Admission: RE | Admit: 2013-01-29 | Discharge: 2013-01-29 | Disposition: A | Discharge status: Home / Self Care | Payer: Medicare Other | Source: Ambulatory Visit | Attending: Family Medicine | Admitting: Family Medicine

## 2013-01-29 ENCOUNTER — Encounter: Payer: Self-pay | Admitting: Family Medicine

## 2013-01-29 VITALS — BP 122/84 | HR 82 | Temp 97.8°F | Ht 68.0 in

## 2013-01-29 DIAGNOSIS — R05 Cough: Secondary | ICD-10-CM | POA: Insufficient documentation

## 2013-01-29 DIAGNOSIS — R059 Cough, unspecified: Secondary | ICD-10-CM

## 2013-01-29 DIAGNOSIS — I10 Essential (primary) hypertension: Secondary | ICD-10-CM

## 2013-01-29 DIAGNOSIS — U071 COVID-19: Secondary | ICD-10-CM | POA: Insufficient documentation

## 2013-01-29 DIAGNOSIS — A499 Bacterial infection, unspecified: Secondary | ICD-10-CM

## 2013-01-29 DIAGNOSIS — E876 Hypokalemia: Secondary | ICD-10-CM

## 2013-01-29 DIAGNOSIS — N760 Acute vaginitis: Secondary | ICD-10-CM

## 2013-01-29 HISTORY — DX: Acute vaginitis: N76.0

## 2013-01-29 HISTORY — DX: Cough, unspecified: R05.9

## 2013-01-29 IMAGING — CR DG CHEST 2V
2 series · 2 of 2 positions shown · non-contrast
Comparison: [DATE]

CLINICAL DATA: Cough for several months, hypertension

CHEST - 2 VIEW

[w chest pa]
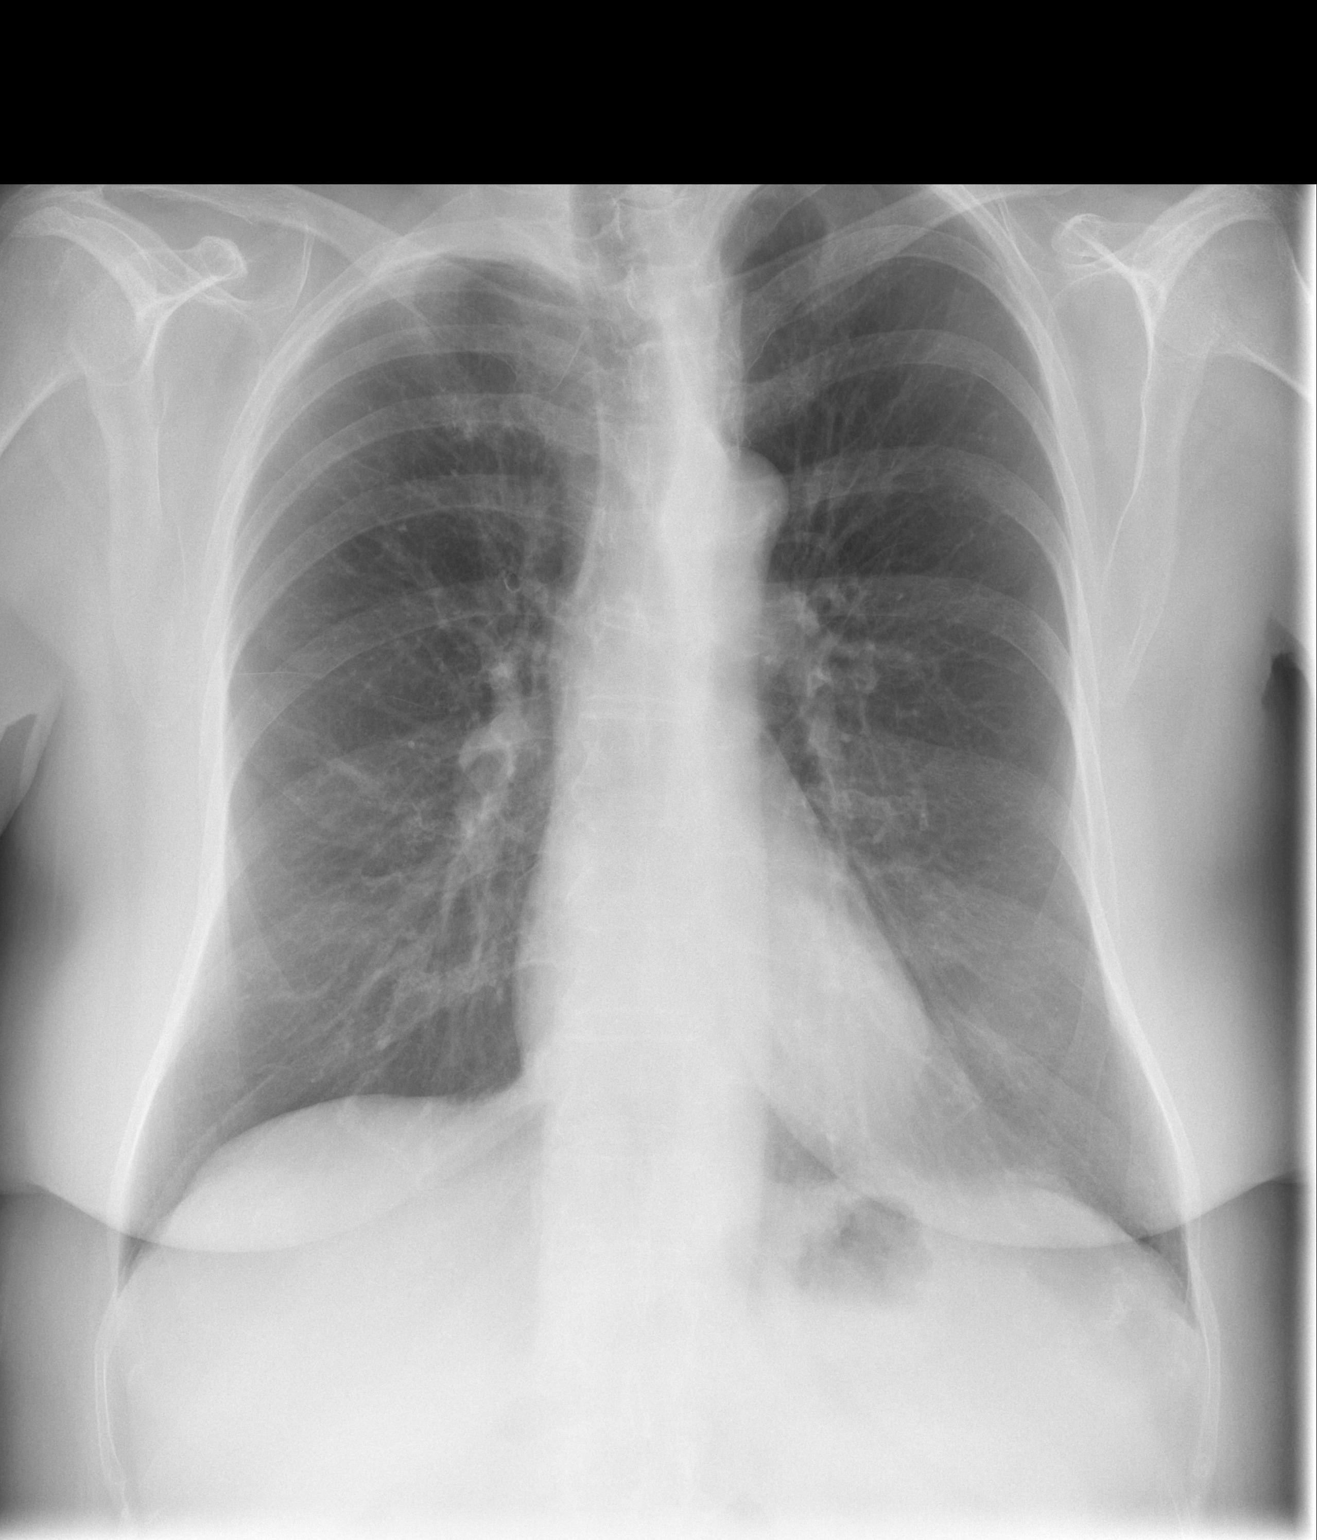

[w chest lat]
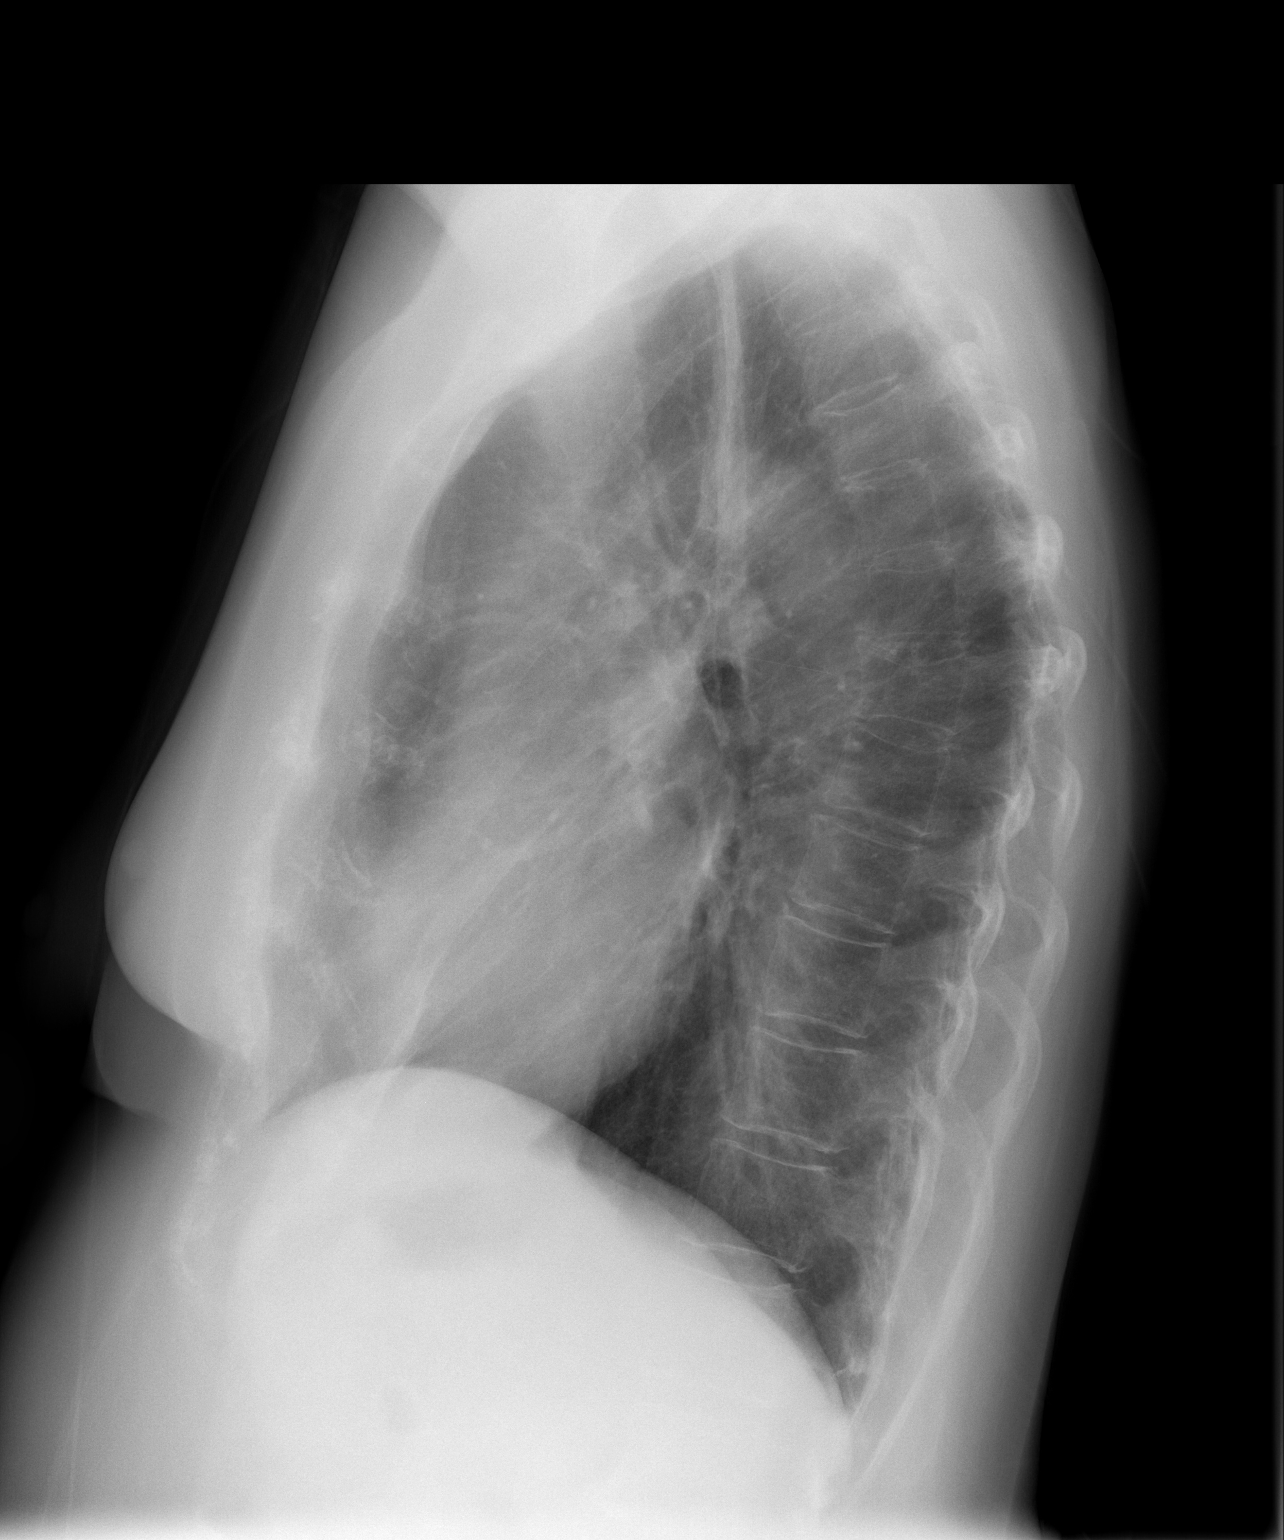

[2 of 2 positions shown; findings below may reference images not displayed]

FINDINGS: Cardiomediastinal silhouette is stable.  No acute
infiltrate or pleural effusion.  No pulmonary edema.  Stable
minimal compression deformity mid thoracic spine.
IMPRESSION: No active disease.  No significant change.

## 2013-01-29 MED ORDER — POTASSIUM CHLORIDE CRYS ER 20 MEQ PO TBCR: 20.0000 meq | EXTENDED_RELEASE_TABLET | Freq: Every day | ORAL | Status: DC

## 2013-01-29 MED ORDER — RANITIDINE HCL 300 MG PO TABS: 300.0000 mg | ORAL_TABLET | Freq: Every day | ORAL | Status: DC

## 2013-01-29 MED ORDER — TRIAMTERENE-HCTZ 37.5-25 MG PO TABS: 1.0000 | ORAL_TABLET | Freq: Every day | ORAL | Status: DC

## 2013-01-29 MED ORDER — METOPROLOL SUCCINATE ER 25 MG PO TB24: 25.0000 mg | ORAL_TABLET | Freq: Every day | ORAL | Status: DC

## 2013-01-29 MED ORDER — BENZONATATE 100 MG PO CAPS: 100.0000 mg | ORAL_CAPSULE | Freq: Three times a day (TID) | ORAL | Status: DC | PRN

## 2013-01-29 MED ORDER — CETIRIZINE HCL 10 MG PO TABS: 10.0000 mg | ORAL_TABLET | Freq: Every day | ORAL | Status: DC | PRN

## 2013-01-29 NOTE — Patient Instructions (Addendum)
Start probiotic daily [employer] Distilled white vinegar and cotton undergarments  Cough, Adult  A cough is a reflex that helps clear your throat and airways. It can help heal the body or may be a reaction to an irritated airway. A cough may only last 2 or 3 weeks (acute) or may last more than 8 weeks (chronic).  CAUSES Acute cough:  Viral or bacterial infections. Chronic cough:  Infections.  Allergies.  Asthma.  Post-nasal drip.  Smoking.  Heartburn or acid reflux.  Some medicines.  Chronic lung problems (COPD).  Cancer. SYMPTOMS   Cough.  Fever.  Chest pain.  Increased breathing rate.  High-pitched whistling sound when breathing (wheezing).  Colored mucus that you cough up (sputum). TREATMENT   A bacterial cough may be treated with antibiotic medicine.  A viral cough must run its course and will not respond to antibiotics.  Your caregiver may recommend other treatments if you have a chronic cough. HOME CARE INSTRUCTIONS   Only take over-the-counter or prescription medicines for pain, discomfort, or fever as directed by your caregiver. Use cough suppressants only as directed by your caregiver.  Use a cold steam vaporizer or humidifier in your bedroom or home to help loosen secretions.  Sleep in a semi-upright position if your cough is worse at night.  Rest as needed.  Stop smoking if you smoke. SEEK IMMEDIATE MEDICAL CARE IF:   You have pus in your sputum.  Your cough starts to worsen.  You cannot control your cough with suppressants and are losing sleep.  You begin coughing up blood.  You have difficulty breathing.  You develop pain which is getting worse or is uncontrolled with medicine.  You have a fever. MAKE SURE YOU:   Understand these instructions.  Will watch your condition.  Will get help right away if you are not doing well or get worse. Document Released: 12/04/2010 Document Revised: 08/30/2011 Document Reviewed:  12/04/2010 [facility] Patient Information 2014 [place], [place].

## 2013-01-29 NOTE — Progress Notes (Signed)
Quick Note:  Patient Informed and voiced understanding ______ 

## 2013-01-29 NOTE — Assessment & Plan Note (Signed)
For roughly 6 months, start Zyrtec in am, Zantac in pm, start a probiotic and proceed with a CXR today, if no improvement will need referral to ENT and further imaging

## 2013-01-29 NOTE — Assessment & Plan Note (Signed)
Well controlled, no changes 

## 2013-01-29 NOTE — Progress Notes (Signed)
Patient ID: Jasmine Wilson, female   DOB: April 17, 1941, 72 y.o.   MRN: [id number] ICESIS RENN [id number] 1941/05/17 01/29/2013      Progress Note-Follow Up  Subjective  Chief Complaint  Chief Complaint  Patient presents with  . Follow-up    med refill    HPI  Patient is a 72 year old Caucasian female who is in today with a complaint of a dry cough for about 6 months now. She reports in the beginning she may have had an upper respiratory infection but has not had obvious upper respiratory infection since then. The cough is dry and has been known to wake her up at night also occurs during the day. Coughing a lot to 3 minutes when it occurs. She denies any significant congestion at this time although she did have a Pap for her eyes for wandering and her nose is running clear liquid. No ear symptoms. No fevers or chills. No chest pain palpitations or wheezing. She does have a long history of abdominal discomfort and dyspepsia but notes this has not recently worsened. She has used hyoscyamine in the past for cramping abdominal pain with good results and has not been needed recently. Her final complaint today is some intermittent vaginal odor. He is itchy times. She tried last Without any improvement. No pain is associated no dysuria or urinary symptoms.  Past Medical History  Diagnosis Date  . Meniere's disease   . Hearing aid worn   . Chicken pox as a child  . Shingles   . Measles as a child  . Hyperlipidemia   . Female bladder prolapse, acquired 01/29/2011  . Preventative health care 01/29/2011  . HTN (hypertension) 01/29/2011  . Glaucoma 01/29/2011  . Abdominal pain 03/02/2011  . History of pneumonia   . Bronchitis 04/04/2011  . Cataract   . Arthritis 03/02/2012    Past Surgical History  Procedure Laterality Date  . Lump removed on left breast      clogged milk duct, benign  . Tubal ligation      Family History  Problem Relation Age of Onset  . Breast cancer Mother   . Stroke Father    . Heart disease Father   . Hyperlipidemia Father   . Hypertension Father   . Breast cancer Sister 39  . Uterine cancer Maternal Grandmother     uterus    History   Social History  . Marital Status: Widowed    Spouse Name: N/A    Number of Children: 3  . Years of Education: N/A   Occupational History  . Retired    Social History Main Topics  . Smoking status: Former Smoker    Types: Cigarettes    Quit date: 06/21/1970  . Smokeless tobacco: Never Used     Comment: occasional smoker  . Alcohol Use: No  . Drug Use: No  . Sexually Active: No   Other Topics Concern  . Not on file   Social History Narrative  . No narrative on file    Current Outpatient Prescriptions on File Prior to Visit  Medication Sig Dispense Refill  . dorzolamide (TRUSOPT) 2 % ophthalmic solution       . latanoprost (XALATAN) 0.005 % ophthalmic solution       . metoprolol succinate (TOPROL-XL) 25 MG 24 hr tablet Take 1 tablet (25 mg total) by mouth daily. Take in pm Replaces the Tartrate  90 tablet  2  . potassium chloride SA (KLOR-CON M20) 20 MEQ tablet Take  1 tablet (20 mEq total) by mouth daily.  90 tablet  0  . Red Yeast Rice Extract (RED YEAST RICE PO) Take by mouth.        . timolol (TIMOPTIC) 0.5 % ophthalmic solution       . triamterene-hydrochlorothiazide (MAXZIDE-25) 37.5-25 MG per tablet Take 1 each (1 tablet total) by mouth daily. Take in am  90 tablet  2   Current Facility-Administered Medications on File Prior to Visit  Medication Dose Route Frequency Provider Last Rate Last Dose  . 0.9 %  sodium chloride infusion  500 mL Intravenous Continuous Meryl Dare, MD        No Known Allergies  Review of Systems  Review of Systems  Constitutional: Negative for fever and malaise/fatigue.  HENT: Negative for congestion.   Eyes: Negative for pain and discharge.  Respiratory: Negative for shortness of breath.   Cardiovascular: Negative for chest pain, palpitations and leg swelling.   Gastrointestinal: Negative for nausea, abdominal pain and diarrhea.  Genitourinary: Negative for dysuria.  Musculoskeletal: Negative for falls.  Skin: Negative for rash.  Neurological: Negative for loss of consciousness and headaches.  Endo/Heme/Allergies: Negative for polydipsia.  Psychiatric/Behavioral: Negative for depression and suicidal ideas. The patient is not nervous/anxious and does not have insomnia.     Objective  BP 122/84  Pulse 82  Temp(Src) 97.8 F (36.6 C) (Oral)  Ht 5\' 8"  (1.727 m)  SpO2 95%  Physical Exam  Physical Exam  Constitutional: She is oriented to person, place, and time and well-developed, well-nourished, and in no distress. No distress.  HENT:  Head: Normocephalic and atraumatic.  Eyes: Conjunctivae are normal.  Neck: Neck supple. No thyromegaly present.  Cardiovascular: Normal rate and regular rhythm.  Exam reveals no gallop.   No murmur heard. Pulmonary/Chest: Effort normal and breath sounds normal. She has no wheezes.  Abdominal: She exhibits no distension and no mass.  Musculoskeletal: She exhibits no edema.  Lymphadenopathy:    She has no cervical adenopathy.  Neurological: She is alert and oriented to person, place, and time.  Skin: Skin is warm and dry. No rash noted. She is not diaphoretic.  Psychiatric: Memory, affect and judgment normal.    Lab Results  Component Value Date   TSH 1.01 02/24/2012   Lab Results  Component Value Date   WBC 5.8 02/24/2012   HGB 14.1 02/24/2012   HCT 43.3 02/24/2012   MCV 91.9 02/24/2012   PLT 290.0 02/24/2012   Lab Results  Component Value Date   CREATININE 0.9 02/24/2012   BUN 13 02/24/2012   NA 137 02/24/2012   K 4.2 02/24/2012   CL 102 02/24/2012   CO2 26 02/24/2012   Lab Results  Component Value Date   ALT 13 02/24/2012   AST 16 02/24/2012   ALKPHOS 65 02/24/2012   BILITOT 0.6 02/24/2012   Lab Results  Component Value Date   CHOL 290* 02/24/2012   Lab Results  Component Value Date   HDL 68.40 02/24/2012    Lab Results  Component Value Date   LDLCALC 129* 02/25/2011   Lab Results  Component Value Date   TRIG 231.0* 02/24/2012   Lab Results  Component Value Date   CHOLHDL 4 02/24/2012     Assessment & Plan  Cough For roughly 6 months, start Zyrtec in am, Zantac in pm, start a probiotic and proceed with a CXR today, if no improvement will need referral to ENT and further imaging  HTN (hypertension) Well  controlled, no changes  Vaginosis Describes a mild fishy odor intermittently without discharge. Start a probiotic and distilled white vinegar. Check cultures if no improvement at next visit.

## 2013-01-29 NOTE — Assessment & Plan Note (Signed)
Describes a mild fishy odor intermittently without discharge. Start a probiotic and distilled white vinegar. Check cultures if no improvement at next visit.

## 2013-03-05 ENCOUNTER — Encounter: Payer: Medicare Other | Admitting: Family Medicine

## 2013-03-14 ENCOUNTER — Ambulatory Visit (INDEPENDENT_AMBULATORY_CARE_PROVIDER_SITE_OTHER): Payer: Medicare Other | Admitting: Family Medicine

## 2013-03-14 ENCOUNTER — Encounter: Payer: Self-pay | Admitting: Family Medicine

## 2013-03-14 VITALS — HR 80 | Temp 97.6°F | Ht 68.0 in | Wt 180.1 lb

## 2013-03-14 DIAGNOSIS — Z0000 Encounter for general adult medical examination without abnormal findings: Secondary | ICD-10-CM

## 2013-03-14 DIAGNOSIS — Z780 Asymptomatic menopausal state: Secondary | ICD-10-CM

## 2013-03-14 DIAGNOSIS — A499 Bacterial infection, unspecified: Secondary | ICD-10-CM

## 2013-03-14 DIAGNOSIS — E785 Hyperlipidemia, unspecified: Secondary | ICD-10-CM

## 2013-03-14 DIAGNOSIS — I10 Essential (primary) hypertension: Secondary | ICD-10-CM

## 2013-03-14 DIAGNOSIS — N760 Acute vaginitis: Secondary | ICD-10-CM

## 2013-03-14 DIAGNOSIS — H409 Unspecified glaucoma: Secondary | ICD-10-CM

## 2013-03-14 DIAGNOSIS — Z124 Encounter for screening for malignant neoplasm of cervix: Secondary | ICD-10-CM

## 2013-03-14 LAB — RENAL FUNCTION PANEL
Albumin: 4.4 g/dL (ref 3.5–5.2)
BUN: 13 mg/dL (ref 6–23)
CO2: 26 mEq/L (ref 19–32)
Calcium: 9.7 mg/dL (ref 8.4–10.5)
Creat: 0.83 mg/dL (ref 0.50–1.10)
Glucose, Bld: 90 mg/dL (ref 70–99)

## 2013-03-14 LAB — CBC
HCT: 44 % (ref 36.0–46.0)
Hemoglobin: 14.9 g/dL (ref 12.0–15.0)
MCH: 29.3 pg (ref 26.0–34.0)
MCHC: 33.9 g/dL (ref 30.0–36.0)
MCV: 86.6 fL (ref 78.0–100.0)

## 2013-03-14 LAB — LIPID PANEL: Cholesterol: 286 mg/dL — ABNORMAL HIGH (ref 0–200)

## 2013-03-14 LAB — HEPATIC FUNCTION PANEL
ALT: 13 U/L (ref 0–35)
AST: 17 U/L (ref 0–37)
Total Protein: 7 g/dL (ref 6.0–8.3)

## 2013-03-14 LAB — TSH: TSH: 1.425 u[IU]/mL (ref 0.350–4.500)

## 2013-03-14 NOTE — Patient Instructions (Signed)

## 2013-03-18 NOTE — Assessment & Plan Note (Signed)
Patient hesitant to take statin, consider red yeast rice and krill oil caps

## 2013-03-18 NOTE — Assessment & Plan Note (Signed)
following with opthamology

## 2013-03-18 NOTE — Progress Notes (Signed)
Patient ID: Jasmine Wilson, female   DOB: March 14, 1941, 72 y.o.   MRN: [id number] KADEISHA BETSCH [id number] 01-26-1941 03/18/2013      Progress Note-Follow Up  Subjective  Chief Complaint  Chief Complaint  Patient presents with  . Annual Exam    physical  . Gynecologic Exam    pap    HPI  Patient is a 72 yo caucasian female in today for annual exam and her vaginosis feeling better with use of distlled white vinegar. Otherwise she says she's doing well. No recent illness. No chest pain or palpitations. Cough is greatly improved since her last visit. She felt that the Zantac was causing her to urinate more frequently so she stopped it. When she restarted it she felt that again. She denies fevers or chills. She denies any significant nasal congestion or heartburn symptoms. Denies chest pain, palpitations, shortness of breath, GI or GU concerns. GYN no breast complaints noted today. Taking medications other than Zantac as prescribed. Past Medical History  Diagnosis Date  . Meniere's disease   . Hearing aid worn   . Chicken pox as a child  . Shingles   . Measles as a child  . Hyperlipidemia   . Female bladder prolapse, acquired 01/29/2011  . Preventative health care 01/29/2011  . HTN (hypertension) 01/29/2011  . Glaucoma 01/29/2011  . Abdominal pain 03/02/2011  . History of pneumonia   . Bronchitis 04/04/2011  . Cataract   . Arthritis 03/02/2012  . Cough 01/29/2013  . Vaginosis 01/29/2013    Past Surgical History  Procedure Laterality Date  . Lump removed on left breast      clogged milk duct, benign  . Tubal ligation      Family History  Problem Relation Age of Onset  . Breast cancer Mother   . Stroke Father   . Heart disease Father   . Hyperlipidemia Father   . Hypertension Father   . Breast cancer Sister 22  . Uterine cancer Maternal Grandmother     uterus    History   Social History  . Marital Status: Widowed    Spouse Name: N/A    Number of Children: 3  . Years of  Education: N/A   Occupational History  . Retired    Social History Main Topics  . Smoking status: Former Smoker    Types: Cigarettes    Quit date: 06/21/1970  . Smokeless tobacco: Never Used     Comment: occasional smoker  . Alcohol Use: No  . Drug Use: No  . Sexual Activity: No   Other Topics Concern  . Not on file   Social History Narrative  . No narrative on file    Current Outpatient Prescriptions on File Prior to Visit  Medication Sig Dispense Refill  . benzonatate (TESSALON) 100 MG capsule Take 1 capsule (100 mg total) by mouth 3 (three) times daily as needed for cough.  30 capsule  1  . cetirizine (ZYRTEC) 10 MG tablet Take 1 tablet (10 mg total) by mouth daily as needed for allergies or rhinitis (cough).  90 tablet  1  . dorzolamide (TRUSOPT) 2 % ophthalmic solution       . latanoprost (XALATAN) 0.005 % ophthalmic solution       . metoprolol succinate (TOPROL-XL) 25 MG 24 hr tablet Take 1 tablet (25 mg total) by mouth daily. Take in pm  90 tablet  2  . potassium chloride SA (KLOR-CON M20) 20 MEQ tablet Take 1 tablet (20  mEq total) by mouth daily.  90 tablet  0  . Red Yeast Rice Extract (RED YEAST RICE PO) Take by mouth.        . timolol (TIMOPTIC) 0.5 % ophthalmic solution       . triamterene-hydrochlorothiazide (MAXZIDE-25) 37.5-25 MG per tablet Take 1 tablet by mouth daily. Take in am  90 tablet  2   Current Facility-Administered Medications on File Prior to Visit  Medication Dose Route Frequency Provider Last Rate Last Dose  . 0.9 %  sodium chloride infusion  500 mL Intravenous Continuous Meryl Dare, MD        No Known Allergies  Review of Systems  Review of Systems  Constitutional: Negative for fever and malaise/fatigue.  HENT: Negative for congestion.   Eyes: Negative for discharge.  Respiratory: Negative for shortness of breath.   Cardiovascular: Negative for chest pain, palpitations and leg swelling.  Gastrointestinal: Negative for nausea,  abdominal pain and diarrhea.  Genitourinary: Negative for dysuria.  Musculoskeletal: Negative for falls.  Skin: Negative for rash.  Neurological: Negative for loss of consciousness and headaches.  Endo/Heme/Allergies: Negative for polydipsia.  Psychiatric/Behavioral: Negative for depression and suicidal ideas. The patient is not nervous/anxious and does not have insomnia.     Objective  Pulse 80  Temp(Src) 97.6 F (36.4 C) (Oral)  Ht 5\' 8"  (1.727 m)  Wt 180 lb 1.3 oz (81.684 kg)  BMI 27.39 kg/m2  SpO2 96%  Physical Exam  Physical Exam  Constitutional: She is oriented to person, place, and time and well-developed, well-nourished, and in no distress. No distress.  HENT:  Head: Normocephalic and atraumatic.  Eyes: Conjunctivae are normal.  Neck: Neck supple. No thyromegaly present.  Cardiovascular: Normal rate, regular rhythm and normal heart sounds.   No murmur heard. Pulmonary/Chest: Effort normal and breath sounds normal. She has no wheezes.  Abdominal: She exhibits no distension and no mass.  Musculoskeletal: She exhibits no edema.  Lymphadenopathy:    She has no cervical adenopathy.  Neurological: She is alert and oriented to person, place, and time.  Skin: Skin is warm and dry. No rash noted. She is not diaphoretic.  Psychiatric: Memory, affect and judgment normal.    Lab Results  Component Value Date   TSH 1.425 03/14/2013   Lab Results  Component Value Date   WBC 5.4 03/14/2013   HGB 14.9 03/14/2013   HCT 44.0 03/14/2013   MCV 86.6 03/14/2013   PLT 287 03/14/2013   Lab Results  Component Value Date   CREATININE 0.83 03/14/2013   BUN 13 03/14/2013   NA 137 03/14/2013   K 4.8 03/14/2013   CL 103 03/14/2013   CO2 26 03/14/2013   Lab Results  Component Value Date   ALT 13 03/14/2013   AST 17 03/14/2013   ALKPHOS 76 03/14/2013   BILITOT 0.5 03/14/2013   Lab Results  Component Value Date   CHOL 286* 03/14/2013   Lab Results  Component Value Date   HDL 67  03/14/2013   Lab Results  Component Value Date   LDLCALC 175* 03/14/2013   Lab Results  Component Value Date   TRIG 220* 03/14/2013   Lab Results  Component Value Date   CHOLHDL 4.3 03/14/2013     Assessment & Plan  Preventative health care Pap today, no concerns on exam, encouraged heart healthy diet and regular exercise. Declines flu shot. Denies any trouble with falls, depression or ADLs at home  Vaginosis Improved with occasional cleanse with distilled  white vinegar and proiotics  Hyperlipidemia Patient hesitant to take statin, consider red yeast rice and krill oil caps  Glaucoma following with opthamology

## 2013-03-18 NOTE — Assessment & Plan Note (Signed)
Pap today, no concerns on exam, encouraged heart healthy diet and regular exercise. Declines flu shot. Denies any trouble with falls, depression or ADLs at home

## 2013-03-18 NOTE — Assessment & Plan Note (Signed)
Improved with occasional cleanse with distilled white vinegar and proiotics

## 2013-03-19 NOTE — Progress Notes (Signed)
Quick Note:  Patient Informed and voiced understanding ______ 

## 2013-03-20 ENCOUNTER — Other Ambulatory Visit: Payer: Self-pay

## 2013-03-20 DIAGNOSIS — B9689 Other specified bacterial agents as the cause of diseases classified elsewhere: Secondary | ICD-10-CM

## 2013-03-20 MED ORDER — METRONIDAZOLE 500 MG PO TABS: 500.0000 mg | ORAL_TABLET | Freq: Two times a day (BID) | ORAL | Status: DC

## 2013-03-30 ENCOUNTER — Telehealth: Payer: Self-pay | Admitting: *Deleted

## 2013-03-30 NOTE — Telephone Encounter (Signed)
Message copied by Baldwin [place] on Fri Mar 30, 2013  9:41 AM ------      Message from: Danise Edge A      Created: Mon Mar 19, 2013 10:19 AM       Notify neg yeast but positive BV, continue probiotics and take Metronidazole 500 mg po bid x 7 days, disp 14 ------

## 2013-03-30 NOTE — Telephone Encounter (Signed)
Spoke with pt made aware that screeen showed BV and that she should begin Flagyl. Pt to pick up medication today.

## 2013-05-01 ENCOUNTER — Other Ambulatory Visit: Payer: Self-pay | Admitting: *Deleted

## 2013-05-01 DIAGNOSIS — E876 Hypokalemia: Secondary | ICD-10-CM

## 2013-05-01 MED ORDER — POTASSIUM CHLORIDE CRYS ER 20 MEQ PO TBCR: 20.0000 meq | EXTENDED_RELEASE_TABLET | Freq: Every day | ORAL | Status: DC

## 2013-05-01 NOTE — Telephone Encounter (Signed)
Rx request to pharmacy/SLS  

## 2013-06-08 ENCOUNTER — Other Ambulatory Visit (HOSPITAL_COMMUNITY)
Admission: RE | Admit: 2013-06-08 | Discharge: 2013-06-08 | Disposition: A | Discharge status: Home / Self Care | Payer: Medicare Other | Source: Ambulatory Visit | Attending: Family Medicine | Admitting: Family Medicine

## 2013-06-08 ENCOUNTER — Encounter: Payer: Self-pay | Admitting: Family Medicine

## 2013-06-08 ENCOUNTER — Ambulatory Visit (INDEPENDENT_AMBULATORY_CARE_PROVIDER_SITE_OTHER): Payer: Medicare Other | Admitting: Family Medicine

## 2013-06-08 VITALS — BP 116/82 | HR 78 | Temp 98.2°F | Ht 68.0 in | Wt 183.0 lb

## 2013-06-08 DIAGNOSIS — I10 Essential (primary) hypertension: Secondary | ICD-10-CM

## 2013-06-08 DIAGNOSIS — N760 Acute vaginitis: Secondary | ICD-10-CM | POA: Insufficient documentation

## 2013-06-08 DIAGNOSIS — A499 Bacterial infection, unspecified: Secondary | ICD-10-CM

## 2013-06-08 DIAGNOSIS — Z113 Encounter for screening for infections with a predominantly sexual mode of transmission: Secondary | ICD-10-CM | POA: Insufficient documentation

## 2013-06-08 NOTE — Progress Notes (Signed)
Pre visit review using our clinic review tool, if applicable. No additional management support is needed unless otherwise documented below in the visit note. 

## 2013-06-08 NOTE — Patient Instructions (Signed)
probiotics  Vaginitis Vaginitis is an inflammation of the vagina. It is most often caused by a change in the normal balance of the bacteria and yeast that live in the vagina. This change in balance causes an overgrowth of certain bacteria or yeast, which causes the inflammation. There are different types of vaginitis, but the most common types are:  Bacterial vaginosis.  Yeast infection (candidiasis).  Trichomoniasis vaginitis. This is a sexually transmitted infection (STI).  Viral vaginitis.  Atropic vaginitis.  Allergic vaginitis. CAUSES  The cause depends on the type of vaginitis. Vaginitis can be caused by:  Bacteria (bacterial vaginosis).  Yeast (yeast infection).  A parasite (trichomoniasis vaginitis)  A virus (viral vaginitis).  Low hormone levels (atrophic vaginitis). Low hormone levels can occur during pregnancy, breastfeeding, or after menopause.  Irritants, such as bubble baths, scented tampons, and feminine sprays (allergic vaginitis). Other factors can change the normal balance of the yeast and bacteria that live in the vagina. These include:  Antibiotic medicines.  Poor hygiene.  Diaphragms, vaginal sponges, spermicides, birth control pills, and intrauterine devices (IUD).  Sexual intercourse.  Infection.  Uncontrolled diabetes.  A weakened immune system. SYMPTOMS  Symptoms can vary depending on the cause of the vaginitis. Common symptoms include:  Abnormal vaginal discharge.  The discharge is white, gray, or yellow with bacterial vaginosis.  The discharge is thick, white, and cheesy with a yeast infection.  The discharge is frothy and yellow or greenish with trichomoniasis.  A bad vaginal odor.  The odor is fishy with bacterial vaginosis.  Vaginal itching, pain, or swelling.  Painful intercourse.  Pain or burning when urinating. Sometimes, there are no symptoms. TREATMENT  Treatment will vary depending on the type of infection.    Bacterial vaginosis and trichomoniasis are often treated with antibiotic creams or pills.  Yeast infections are often treated with antifungal medicines, such as vaginal creams or suppositories.  Viral vaginitis has no cure, but symptoms can be treated with medicines that relieve discomfort. Your sexual partner should be treated as well.  Atrophic vaginitis may be treated with an estrogen cream, pill, suppository, or vaginal ring. If vaginal dryness occurs, lubricants and moisturizing creams may help. You may be told to avoid scented soaps, sprays, or douches.  Allergic vaginitis treatment involves quitting the use of the product that is causing the problem. Vaginal creams can be used to treat the symptoms. HOME CARE INSTRUCTIONS   Take all medicines as directed by your caregiver.  Keep your genital area clean and dry. Avoid soap and only rinse the area with water.  Avoid douching. It can remove the healthy bacteria in the vagina.  Do not use tampons or have sexual intercourse until your vaginitis has been treated. Use sanitary pads while you have vaginitis.  Wipe from front to back. This avoids the spread of bacteria from the rectum to the vagina.  Let air reach your genital area.  Wear cotton underwear to decrease moisture buildup.  Avoid wearing underwear while you sleep until your vaginitis is gone.  Avoid tight pants and underwear or nylons without a cotton panel.  Take off wet clothing (especially bathing suits) as soon as possible.  Use mild, non-scented products. Avoid using irritants, such as:  Scented feminine sprays.  Fabric softeners.  Scented detergents.  Scented tampons.  Scented soaps or bubble baths.  Practice safe sex and use condoms. Condoms may prevent the spread of trichomoniasis and viral vaginitis. SEEK MEDICAL CARE IF:   You have abdominal pain.  You have a fever or persistent symptoms for more than 2 3 days.  You have a fever and your  symptoms suddenly get worse. Document Released: 04/04/2007 Document Revised: 03/01/2012 Document Reviewed: 11/18/2011 [facility] Patient Information 2014 [place], [place].

## 2013-06-11 ENCOUNTER — Encounter: Payer: Self-pay | Admitting: Family Medicine

## 2013-06-11 NOTE — Progress Notes (Signed)
Patient ID: Jasmine Wilson, female   DOB: 12-24-1940, 72 y.o.   MRN: [id number] Jasmine Wilson [id number] 1941/05/01 06/11/2013      Progress Note-Follow Up  Subjective  Chief Complaint  Chief Complaint  Patient presents with  . bacteria isn't any better    smell is "terrible" no discharge    HPI  Patient is a 72 year old Caucasian female who is in today complaining of return of vaginal discharge. She says this is a scant she does have a bad odor. She's previously with Flagyl for bacterial vaginosis smell has returned. She did not tolerate Flagyl as it causes severe nausea. She denies abdominal pain. She's not sexually active. Denies any GI or GU concerns. No fevers, chest pain, palpitations or shortness of breath.  Past Medical History  Diagnosis Date  . Meniere's disease   . Hearing aid worn   . Chicken pox as a child  . Shingles   . Measles as a child  . Hyperlipidemia   . Female bladder prolapse, acquired 01/29/2011  . Preventative health care 01/29/2011  . HTN (hypertension) 01/29/2011  . Glaucoma 01/29/2011  . Abdominal pain 03/02/2011  . History of pneumonia   . Bronchitis 04/04/2011  . Cataract   . Arthritis 03/02/2012  . Cough 01/29/2013  . Vaginosis 01/29/2013    Past Surgical History  Procedure Laterality Date  . Lump removed on left breast      clogged milk duct, benign  . Tubal ligation      Family History  Problem Relation Age of Onset  . Breast cancer Mother   . Stroke Father   . Heart disease Father   . Hyperlipidemia Father   . Hypertension Father   . Breast cancer Sister 17  . Uterine cancer Maternal Grandmother     uterus    History   Social History  . Marital Status: Widowed    Spouse Name: N/A    Number of Children: 3  . Years of Education: N/A   Occupational History  . Retired    Social History Main Topics  . Smoking status: Former Smoker    Types: Cigarettes    Quit date: 06/21/1970  . Smokeless tobacco: Never Used     Comment:  occasional smoker  . Alcohol Use: No  . Drug Use: No  . Sexual Activity: No   Other Topics Concern  . Not on file   Social History Narrative  . No narrative on file    Current Outpatient Prescriptions on File Prior to Visit  Medication Sig Dispense Refill  . cetirizine (ZYRTEC) 10 MG tablet Take 1 tablet (10 mg total) by mouth daily as needed for allergies or rhinitis (cough).  90 tablet  1  . dorzolamide (TRUSOPT) 2 % ophthalmic solution       . latanoprost (XALATAN) 0.005 % ophthalmic solution       . metoprolol succinate (TOPROL-XL) 25 MG 24 hr tablet Take 1 tablet (25 mg total) by mouth daily. Take in pm  90 tablet  2  . potassium chloride SA (KLOR-CON M20) 20 MEQ tablet Take 1 tablet (20 mEq total) by mouth daily.  90 tablet  0  . Red Yeast Rice Extract (RED YEAST RICE PO) Take by mouth.        . timolol (TIMOPTIC) 0.5 % ophthalmic solution       . triamterene-hydrochlorothiazide (MAXZIDE-25) 37.5-25 MG per tablet Take 1 tablet by mouth daily. Take in am  90 tablet  2  Current Facility-Administered Medications on File Prior to Visit  Medication Dose Route Frequency Provider Last Rate Last Dose  . 0.9 %  sodium chloride infusion  500 mL Intravenous Continuous Meryl Dare, MD        No Known Allergies  Review of Systems  Review of Systems  Constitutional: Negative for fever and malaise/fatigue.  HENT: Negative for congestion.   Eyes: Negative for discharge.  Respiratory: Negative for shortness of breath.   Cardiovascular: Negative for chest pain, palpitations and leg swelling.  Gastrointestinal: Negative for nausea, abdominal pain and diarrhea.  Genitourinary: Negative for dysuria.  Musculoskeletal: Negative for falls.  Skin: Negative for rash.  Neurological: Negative for loss of consciousness and headaches.  Endo/Heme/Allergies: Negative for polydipsia.  Psychiatric/Behavioral: Negative for depression and suicidal ideas. The patient is not nervous/anxious and does  not have insomnia.     Objective  BP 116/82  Pulse 78  Temp(Src) 98.2 F (36.8 C) (Oral)  Ht 5\' 8"  (1.727 m)  Wt 183 lb (83.008 kg)  BMI 27.83 kg/m2  SpO2 95%  Physical Exam  Physical Exam  Constitutional: She is oriented to person, place, and time and well-developed, well-nourished, and in no distress. No distress.  HENT:  Head: Normocephalic and atraumatic.  Eyes: Conjunctivae are normal.  Neck: Neck supple. No thyromegaly present.  Cardiovascular: Normal rate, regular rhythm and normal heart sounds.   No murmur heard. Pulmonary/Chest: Effort normal and breath sounds normal. She has no wheezes.  Abdominal: She exhibits no distension and no mass.  Genitourinary: Uterus normal, cervix normal, right adnexa normal and left adnexa normal. Vaginal discharge found.  Scant gray vaginal discharge, no odor  Musculoskeletal: She exhibits no edema.  Lymphadenopathy:    She has no cervical adenopathy.  Neurological: She is alert and oriented to person, place, and time.  Skin: Skin is warm and dry. No rash noted. She is not diaphoretic.  Psychiatric: Memory, affect and judgment normal.    Lab Results  Component Value Date   TSH 1.425 03/14/2013   Lab Results  Component Value Date   WBC 5.4 03/14/2013   HGB 14.9 03/14/2013   HCT 44.0 03/14/2013   MCV 86.6 03/14/2013   PLT 287 03/14/2013   Lab Results  Component Value Date   CREATININE 0.83 03/14/2013   BUN 13 03/14/2013   NA 137 03/14/2013   K 4.8 03/14/2013   CL 103 03/14/2013   CO2 26 03/14/2013   Lab Results  Component Value Date   ALT 13 03/14/2013   AST 17 03/14/2013   ALKPHOS 76 03/14/2013   BILITOT 0.5 03/14/2013   Lab Results  Component Value Date   CHOL 286* 03/14/2013   Lab Results  Component Value Date   HDL 67 03/14/2013   Lab Results  Component Value Date   LDLCALC 175* 03/14/2013   Lab Results  Component Value Date   TRIG 220* 03/14/2013   Lab Results  Component Value Date   CHOLHDL 4.3 03/14/2013      Assessment & Plan  HTN (hypertension) Well controlled, no changes  Vaginosis She reports discharge with odor has returned. Check ancillary cultures and encouraged probiotics. If all negative consider pelvic ultrasound and/or referral to gyn

## 2013-06-11 NOTE — Assessment & Plan Note (Signed)
Well controlled, no changes 

## 2013-06-11 NOTE — Assessment & Plan Note (Signed)
She reports discharge with odor has returned. Check ancillary cultures and encouraged probiotics. If all negative consider pelvic ultrasound and/or referral to gyn

## 2013-06-15 ENCOUNTER — Telehealth: Payer: Self-pay | Admitting: Family Medicine

## 2013-06-15 NOTE — Telephone Encounter (Signed)
PATIENT HOME NOW AND RETURNING YOUR CALL

## 2013-06-15 NOTE — Telephone Encounter (Signed)
See result note.  

## 2013-07-06 ENCOUNTER — Ambulatory Visit: Payer: Medicare Other | Admitting: Family Medicine

## 2013-07-24 ENCOUNTER — Ambulatory Visit (INDEPENDENT_AMBULATORY_CARE_PROVIDER_SITE_OTHER): Payer: Medicare Other | Admitting: Family Medicine

## 2013-07-24 ENCOUNTER — Encounter: Payer: Self-pay | Admitting: Family Medicine

## 2013-07-24 ENCOUNTER — Other Ambulatory Visit (HOSPITAL_COMMUNITY)
Admission: RE | Admit: 2013-07-24 | Discharge: 2013-07-24 | Disposition: A | Discharge status: Home / Self Care | Payer: Medicare Other | Source: Ambulatory Visit | Attending: Family Medicine | Admitting: Family Medicine

## 2013-07-24 VITALS — BP 122/64 | HR 87 | Temp 97.5°F | Ht 68.0 in | Wt 186.1 lb

## 2013-07-24 DIAGNOSIS — Z23 Encounter for immunization: Secondary | ICD-10-CM

## 2013-07-24 DIAGNOSIS — A499 Bacterial infection, unspecified: Secondary | ICD-10-CM

## 2013-07-24 DIAGNOSIS — B9689 Other specified bacterial agents as the cause of diseases classified elsewhere: Secondary | ICD-10-CM

## 2013-07-24 DIAGNOSIS — R059 Cough, unspecified: Secondary | ICD-10-CM

## 2013-07-24 DIAGNOSIS — N390 Urinary tract infection, site not specified: Secondary | ICD-10-CM

## 2013-07-24 DIAGNOSIS — E876 Hypokalemia: Secondary | ICD-10-CM

## 2013-07-24 DIAGNOSIS — I10 Essential (primary) hypertension: Secondary | ICD-10-CM

## 2013-07-24 DIAGNOSIS — N760 Acute vaginitis: Secondary | ICD-10-CM | POA: Insufficient documentation

## 2013-07-24 DIAGNOSIS — R05 Cough: Secondary | ICD-10-CM

## 2013-07-24 DIAGNOSIS — N8111 Cystocele, midline: Secondary | ICD-10-CM

## 2013-07-24 DIAGNOSIS — N8110 Cystocele, unspecified: Secondary | ICD-10-CM

## 2013-07-24 MED ORDER — POTASSIUM CHLORIDE CRYS ER 20 MEQ PO TBCR: 20.0000 meq | EXTENDED_RELEASE_TABLET | Freq: Every day | ORAL | Status: DC

## 2013-07-24 MED ORDER — ZOSTER VACCINE LIVE 19400 UNT/0.65ML ~~LOC~~ SOLR: 0.6500 mL | Freq: Once | SUBCUTANEOUS | Status: DC

## 2013-07-24 MED ORDER — METOPROLOL SUCCINATE ER 25 MG PO TB24: 25.0000 mg | ORAL_TABLET | Freq: Every day | ORAL | Status: DC

## 2013-07-24 MED ORDER — TRIAMTERENE-HCTZ 37.5-25 MG PO TABS: 1.0000 | ORAL_TABLET | Freq: Every day | ORAL | Status: DC

## 2013-07-24 MED ORDER — CETIRIZINE HCL 10 MG PO TABS: 10.0000 mg | ORAL_TABLET | Freq: Every day | ORAL | Status: DC | PRN

## 2013-07-24 NOTE — Patient Instructions (Signed)
  sarna lotion for itching  Hypertension As your heart beats, it forces blood through your arteries. This force is your blood pressure. If the pressure is too high, it is called hypertension (HTN) or high blood pressure. HTN is dangerous because you may have it and not know it. High blood pressure may mean that your heart has to work harder to pump blood. Your arteries may be narrow or stiff. The extra work puts you at risk for heart disease, stroke, and other problems.  Blood pressure consists of two numbers, a higher number over a lower, 110/72, for example. It is stated as "110 over 72." The ideal is below 120 for the top number (systolic) and under 80 for the bottom (diastolic). Write down your blood pressure today. You should pay close attention to your blood pressure if you have certain conditions such as:  Heart failure.  Prior heart attack.  Diabetes  Chronic kidney disease.  Prior stroke.  Multiple risk factors for heart disease. To see if you have HTN, your blood pressure should be measured while you are seated with your arm held at the level of the heart. It should be measured at least twice. A one-time elevated blood pressure reading (especially in the Emergency Department) does not mean that you need treatment. There may be conditions in which the blood pressure is different between your right and left arms. It is important to see your caregiver soon for a recheck. Most people have essential hypertension which means that there is not a specific cause. This type of high blood pressure may be lowered by changing lifestyle factors such as:  Stress.  Smoking.  Lack of exercise.  Excessive weight.  Drug/tobacco/alcohol use.  Eating less salt. Most people do not have symptoms from high blood pressure until it has caused damage to the body. Effective treatment can often prevent, delay or reduce that damage. TREATMENT  When a cause has been identified, treatment for high blood  pressure is directed at the cause. There are a large number of medications to treat HTN. These fall into several categories, and your caregiver will help you select the medicines that are best for you. Medications may have side effects. You should review side effects with your caregiver. If your blood pressure stays high after you have made lifestyle changes or started on medicines,   Your medication(s) may need to be changed.  Other problems may need to be addressed.  Be certain you understand your prescriptions, and know how and when to take your medicine.  Be sure to follow up with your caregiver within the time frame advised (usually within two weeks) to have your blood pressure rechecked and to review your medications.  If you are taking more than one medicine to lower your blood pressure, make sure you know how and at what times they should be taken. Taking two medicines at the same time can result in blood pressure that is too low. SEEK IMMEDIATE MEDICAL CARE IF:  You develop a severe headache, blurred or changing vision, or confusion.  You have unusual weakness or numbness, or a faint feeling.  You have severe chest or abdominal pain, vomiting, or breathing problems. MAKE SURE YOU:   Understand these instructions.  Will watch your condition.  Will get help right away if you are not doing well or get worse. Document Released: 06/07/2005 Document Revised: 08/30/2011 Document Reviewed: 01/26/2008 [facility] Patient Information 2014 [place].

## 2013-07-24 NOTE — Progress Notes (Signed)
Pre visit review using our clinic review tool, if applicable. No additional management support is needed unless otherwise documented below in the visit note. 

## 2013-07-25 ENCOUNTER — Telehealth: Payer: Self-pay | Admitting: Family Medicine

## 2013-07-25 ENCOUNTER — Encounter: Payer: Self-pay | Admitting: Family Medicine

## 2013-07-25 LAB — URINALYSIS
Bilirubin Urine: NEGATIVE
Glucose, UA: NEGATIVE mg/dL
Hgb urine dipstick: NEGATIVE
Ketones, ur: NEGATIVE mg/dL
NITRITE: NEGATIVE
PROTEIN: NEGATIVE mg/dL
Specific Gravity, Urine: 1.008 (ref 1.005–1.030)
UROBILINOGEN UA: 0.2 mg/dL (ref 0.0–1.0)
pH: 5.5 (ref 5.0–8.0)

## 2013-07-25 NOTE — Telephone Encounter (Signed)
Relevant patient education assigned to patient using Emmi. ° °

## 2013-07-25 NOTE — Assessment & Plan Note (Signed)
Well controlled, no changes 

## 2013-07-25 NOTE — Assessment & Plan Note (Signed)
No discharge just odor, discussed possibility of imaging, will hold for now after discussing with patient will consider in the future especially if she worsens

## 2013-07-25 NOTE — Progress Notes (Signed)
Patient ID: Jasmine Wilson, female   DOB: February 12, 1941, 73 y.o.   MRN: [id number] Jasmine Wilson [id number] [birth year] 07/25/2013      Progress Note-Follow Up  Subjective  Chief Complaint  Chief Complaint  Patient presents with  . Follow-up    HPI  Patient is a 73 yo in today for further follow up. She feels well except for her persistent vaginal odor or she is beginning to question maybe it is her urine is malodorous. She denies any discharge she denies any pain. She denies any GI complaints. She denies any urinary complaints otherwise. No dysuria or hematuria. No abdominal or back pain. No fevers, chills, chest pain or palpitations. Taking medications as prescribed.  Past Medical History  Diagnosis Date  . Meniere's disease   . Hearing aid worn   . Chicken pox as a child  . Shingles   . Measles as a child  . Hyperlipidemia   . Female bladder prolapse, acquired 01/29/2011  . Preventative health care 01/29/2011  . HTN (hypertension) 01/29/2011  . Glaucoma 01/29/2011  . Abdominal pain 03/02/2011  . History of pneumonia   . Bronchitis 04/04/2011  . Cataract   . Arthritis 03/02/2012  . Cough 01/29/2013  . Vaginosis 01/29/2013    Past Surgical History  Procedure Laterality Date  . Lump removed on left breast      clogged milk duct, benign  . Tubal ligation      Family History  Problem Relation Age of Onset  . Breast cancer Mother   . Stroke Father   . Heart disease Father   . Hyperlipidemia Father   . Hypertension Father   . Breast cancer Sister 91  . Uterine cancer Maternal Grandmother     uterus    History   Social History  . Marital Status: Widowed    Spouse Name: N/A    Number of Children: 3  . Years of Education: N/A   Occupational History  . Retired    Social History Main Topics  . Smoking status: Former Smoker    Types: Cigarettes    Quit date: 06/21/1970  . Smokeless tobacco: Never Used     Comment: occasional smoker  . Alcohol Use: No  . Drug Use: No  .  Sexual Activity: No   Other Topics Concern  . Not on file   Social History Narrative  . No narrative on file    Current Outpatient Prescriptions on File Prior to Visit  Medication Sig Dispense Refill  . dorzolamide (TRUSOPT) 2 % ophthalmic solution       . latanoprost (XALATAN) 0.005 % ophthalmic solution       . Red Yeast Rice Extract (RED YEAST RICE PO) Take by mouth.        . timolol (TIMOPTIC) 0.5 % ophthalmic solution        Current Facility-Administered Medications on File Prior to Visit  Medication Dose Route Frequency Provider Last Rate Last Dose  . 0.9 %  sodium chloride infusion  500 mL Intravenous Continuous Ladene Artist, MD        No Known Allergies  Review of Systems  Review of Systems  Constitutional: Negative for fever and malaise/fatigue.  HENT: Negative for congestion.   Eyes: Negative for discharge.  Respiratory: Negative for shortness of breath.   Cardiovascular: Negative for chest pain, palpitations and leg swelling.  Gastrointestinal: Negative for nausea, abdominal pain and diarrhea.  Genitourinary: Negative for dysuria, hematuria and flank pain.  Musculoskeletal: Negative  for falls and myalgias.  Skin: Negative for rash.  Neurological: Negative for loss of consciousness and headaches.  Endo/Heme/Allergies: Negative for polydipsia.  Psychiatric/Behavioral: Negative for depression and suicidal ideas. The patient is not nervous/anxious and does not have insomnia.     Objective  BP 122/64  Pulse 87  Temp(Src) 97.5 F (36.4 C) (Oral)  Ht 5\' 8"  (1.727 m)  Wt 186 lb 1.9 oz (84.423 kg)  BMI 28.31 kg/m2  SpO2 96%  Physical Exam  Physical Exam  Constitutional: She is oriented to person, place, and time and well-developed, well-nourished, and in no distress. No distress.  HENT:  Head: Normocephalic and atraumatic.  Eyes: Conjunctivae are normal.  Neck: Neck supple. No thyromegaly present.  Cardiovascular: Normal rate, regular rhythm and normal  heart sounds.   No murmur heard. Pulmonary/Chest: Effort normal and breath sounds normal. She has no wheezes.  Abdominal: She exhibits no distension and no mass.  Musculoskeletal: She exhibits no edema.  Lymphadenopathy:    She has no cervical adenopathy.  Neurological: She is alert and oriented to person, place, and time.  Skin: Skin is warm and dry. No rash noted. She is not diaphoretic.  Psychiatric: Memory, affect and judgment normal.    Lab Results  Component Value Date   TSH 1.425 03/14/2013   Lab Results  Component Value Date   WBC 5.4 03/14/2013   HGB 14.9 03/14/2013   HCT 44.0 03/14/2013   MCV 86.6 03/14/2013   PLT 287 03/14/2013   Lab Results  Component Value Date   CREATININE 0.83 03/14/2013   BUN 13 03/14/2013   NA 137 03/14/2013   K 4.8 03/14/2013   CL 103 03/14/2013   CO2 26 03/14/2013   Lab Results  Component Value Date   ALT 13 03/14/2013   AST 17 03/14/2013   ALKPHOS 76 03/14/2013   BILITOT 0.5 03/14/2013   Lab Results  Component Value Date   CHOL 286* 03/14/2013   Lab Results  Component Value Date   HDL 67 03/14/2013   Lab Results  Component Value Date   LDLCALC 175* 03/14/2013   Lab Results  Component Value Date   TRIG 220* 03/14/2013   Lab Results  Component Value Date   CHOLHDL 4.3 03/14/2013     Assessment & Plan  HTN (hypertension) Well controlled, no changes  Vaginosis No discharge just odor, discussed possibility of imaging, will hold for now after discussing with patient will consider in the future especially if she worsens  Female bladder prolapse, acquired H/o UTI, will check urine samples today. UA unremarkable urine culture pending

## 2013-07-25 NOTE — Assessment & Plan Note (Signed)
H/o UTI, will check urine samples today. UA unremarkable urine culture pending

## 2013-07-26 LAB — URINE CULTURE

## 2013-07-26 MED ORDER — CIPROFLOXACIN HCL 250 MG PO TABS: 250.0000 mg | ORAL_TABLET | Freq: Two times a day (BID) | ORAL | Status: DC

## 2013-07-26 NOTE — Addendum Note (Signed)
Addended by: Varney Daily on: 07/26/2013 08:33 AM   Modules accepted: Orders

## 2014-01-28 ENCOUNTER — Other Ambulatory Visit: Payer: Self-pay

## 2014-01-28 DIAGNOSIS — E876 Hypokalemia: Secondary | ICD-10-CM

## 2014-01-28 MED ORDER — POTASSIUM CHLORIDE CRYS ER 20 MEQ PO TBCR: 20.0000 meq | EXTENDED_RELEASE_TABLET | Freq: Every day | ORAL | Status: DC

## 2014-01-28 MED ORDER — METOPROLOL SUCCINATE ER 25 MG PO TB24: 25.0000 mg | ORAL_TABLET | Freq: Every day | ORAL | Status: DC

## 2014-01-28 MED ORDER — TRIAMTERENE-HCTZ 37.5-25 MG PO TABS: 1.0000 | ORAL_TABLET | Freq: Every day | ORAL | Status: DC

## 2014-01-28 NOTE — Telephone Encounter (Signed)
Pt left message requesting refills of 1. Potassium chloride 2. Triamterene Hctz 3. Metoprolol.  Please advise refill  Drug-Drug Interaction Report  Potassium Preparations / Potassium-Sparing Diuretics  Significance: Major  Warning: Co-administration of potassium chloride SA and triamterene-hydrochlorothiazide may cause hyperkalemia, possibly leading to cardiac arrhythmias or cardiac arrest.  Onset: Delayed Document Level: Established  Interacting Medications/Orders:  Potassium Preparations Oral or Non-Oral, Systemic Potassium-Sparing Diuretics Oral or Non-Oral, Systemic  1. potassium chloride SA Order: potassium chloride SA (KLOR-CON M20) 20 MEQ tablet Route: Oral Start: 01/28/2014 End: none Frequency: Daily Order ([id number]): potassium chloride SA (KLOR-CON M20) 20 MEQ tablet Route: Oral Start: 07/24/2013 End: none Frequency: Daily 1. triamterene-hydrochlorothiazide Order: triamterene-hydrochlorothiazide (MAXZIDE-25) 37.5-25 MG per tablet Route: Oral Start: 01/28/2014 End: none Frequency: Daily Order ([id number]): triamterene-hydrochlorothiazide (MAXZIDE-25) 37.5-25 MG per tablet Route: Oral Start: 07/24/2013 End: none Frequency: Daily   Management Code: Professional review suggested  Effects: Use of potassium chloride SA and triamterene-hydrochlorothiazide may increase the risk of hyperkalemia. Cardiac arrhythmias or cardiac arrest may occur.  Mechanism: triamterene-hydrochlorothiazide may decrease the renal excretion of potassium.  Management: The combination of potassium chloride SA and triamterene-hydrochlorothiazide, especially in patients with impaired renal function, should be avoided. Serum potassium should be closely monitored.  Discussion: Potassium-sparing diuretics can produce hyperkalemia (1-5). The addition of potassium supplements substantially increases the risk of hyperkalemia, especially in the presence of impaired renal function (6-9). In a report from the [facility], hyperkalemia was reported in 68 of 788 (8.6%) patients taking spironolactone (6). Hyperkalemia was more common in patients taking potassium supplements (15.8%) than in those who did not (5.7%), and in patients with a BUN of 50 mg% or more (20.3%). Monitor serum potassium closely if patients receive a potassium-sparing diuretic and a potassium supplement.  References: 1. Susy Frizzle et al: Clin Pharmacol Ther 3:143(8887). 2. McNay JL et al: Metabolism 57:97(2820). 3. Tana Felts et al: Natasha [facility] Med Sci [phone]). 4. Kellie Moor et al: JAMA [phone]:761(4709). 5. Walker BR et al: Clin Pharmacol Ther 29:574(7340). 6. Jerrel Ivory et al: JAMA 370:96(4383). 7. Geralyn Flash MV et al: JAMA [phone](7543). 8. Chad Cordial VV: [place] 60:677(0340). [place] [phone]).  DTMS Copyright 2015 Clinical Drug Information, [facility] Version 15.3 Expires October 2015

## 2014-04-12 ENCOUNTER — Other Ambulatory Visit: Payer: Self-pay | Admitting: Family Medicine

## 2014-04-15 ENCOUNTER — Encounter: Payer: Medicare Other | Admitting: Family Medicine

## 2014-06-25 DIAGNOSIS — M722 Plantar fascial fibromatosis: Secondary | ICD-10-CM | POA: Diagnosis not present

## 2014-06-28 DIAGNOSIS — M722 Plantar fascial fibromatosis: Secondary | ICD-10-CM | POA: Diagnosis not present

## 2014-07-03 DIAGNOSIS — M722 Plantar fascial fibromatosis: Secondary | ICD-10-CM | POA: Diagnosis not present

## 2014-07-08 DIAGNOSIS — M71572 Other bursitis, not elsewhere classified, left ankle and foot: Secondary | ICD-10-CM | POA: Diagnosis not present

## 2014-07-10 DIAGNOSIS — M722 Plantar fascial fibromatosis: Secondary | ICD-10-CM | POA: Diagnosis not present

## 2014-07-17 DIAGNOSIS — H5213 Myopia, bilateral: Secondary | ICD-10-CM | POA: Diagnosis not present

## 2014-07-19 DIAGNOSIS — M722 Plantar fascial fibromatosis: Secondary | ICD-10-CM | POA: Diagnosis not present

## 2014-07-31 DIAGNOSIS — M722 Plantar fascial fibromatosis: Secondary | ICD-10-CM | POA: Diagnosis not present

## 2014-09-11 ENCOUNTER — Telehealth: Payer: Self-pay | Admitting: Family Medicine

## 2014-09-11 NOTE — Telephone Encounter (Signed)
pre visit letter sent °

## 2014-09-30 ENCOUNTER — Telehealth: Payer: Self-pay | Admitting: *Deleted

## 2014-09-30 NOTE — Telephone Encounter (Signed)
Unable to reach patient at time of Pre-Visit Call.  Left message for patient to return call when available.    

## 2014-10-01 ENCOUNTER — Ambulatory Visit (INDEPENDENT_AMBULATORY_CARE_PROVIDER_SITE_OTHER): Payer: Medicare Other | Admitting: Family Medicine

## 2014-10-01 ENCOUNTER — Encounter: Payer: Self-pay | Admitting: Family Medicine

## 2014-10-01 VITALS — BP 120/72 | HR 92 | Temp 98.1°F | Ht 68.0 in | Wt 177.1 lb

## 2014-10-01 DIAGNOSIS — I10 Essential (primary) hypertension: Secondary | ICD-10-CM | POA: Diagnosis not present

## 2014-10-01 DIAGNOSIS — E782 Mixed hyperlipidemia: Secondary | ICD-10-CM

## 2014-10-01 DIAGNOSIS — Z0000 Encounter for general adult medical examination without abnormal findings: Secondary | ICD-10-CM | POA: Diagnosis not present

## 2014-10-01 DIAGNOSIS — G479 Sleep disorder, unspecified: Secondary | ICD-10-CM

## 2014-10-01 DIAGNOSIS — R0683 Snoring: Secondary | ICD-10-CM | POA: Diagnosis not present

## 2014-10-01 DIAGNOSIS — H8109 Meniere's disease, unspecified ear: Secondary | ICD-10-CM

## 2014-10-01 DIAGNOSIS — R51 Headache: Secondary | ICD-10-CM | POA: Diagnosis not present

## 2014-10-01 DIAGNOSIS — R519 Headache, unspecified: Secondary | ICD-10-CM

## 2014-10-01 DIAGNOSIS — E785 Hyperlipidemia, unspecified: Secondary | ICD-10-CM

## 2014-10-01 DIAGNOSIS — L578 Other skin changes due to chronic exposure to nonionizing radiation: Secondary | ICD-10-CM | POA: Diagnosis not present

## 2014-10-01 MED ORDER — METOPROLOL SUCCINATE ER 50 MG PO TB24: 50.0000 mg | ORAL_TABLET | Freq: Every day | ORAL | Status: DC

## 2014-10-01 NOTE — Progress Notes (Signed)
Jasmine Wilson  [id number] March 15, 1941 10/01/2014      Progress Note-Follow Up  Subjective  Chief Complaint  Chief Complaint  Patient presents with  . Annual Exam    HPI  Patient is a 74 y.o. female in today for routine medical care. She is in for annual evaluation. She offers several concerns. 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 No recent illness. Is managing ADLs at home well. She is having frequent am HA and restless sleep. She acknowledges snoring. No other neurologic complaints. Denies CP/palp/SOB/congestion/fevers/GI or GU c/o. Taking meds as prescribed  Past Medical History  Diagnosis Date  . Meniere's disease   . Hearing aid worn   . Chicken pox as a child  . Shingles   . Measles as a child  . Hyperlipidemia   . Female bladder prolapse, acquired 01/29/2011  . Preventative health care 01/29/2011  . HTN (hypertension) 01/29/2011  . Glaucoma 01/29/2011  . Abdominal pain 03/02/2011  . History of pneumonia   . Bronchitis 04/04/2011  . Cataract   . Arthritis 03/02/2012  . Cough 01/29/2013  . Vaginosis 01/29/2013    Past Surgical History  Procedure Laterality Date  . Lump removed on left breast      clogged milk duct, benign  . Tubal ligation      Family History  Problem Relation Age of Onset  . Breast cancer Mother   . Stroke Father   . Heart disease Father   . Hyperlipidemia Father   . Hypertension Father   . Breast cancer Sister 59  . Uterine cancer Maternal Grandmother     uterus    History   Social History  . Marital Status: Widowed    Spouse Name: N/A  . Number of Children: 3  . Years of Education: N/A   Occupational History  . Retired    Social History Main Topics  . Smoking status: Former Smoker    Types: Cigarettes    Quit date: 06/21/1970  . Smokeless tobacco: Never Used     Comment: occasional smoker  . Alcohol Use: No  . Drug Use: No  . Sexual Activity: No   Other Topics Concern  . Not on file    Social History Narrative    Current Outpatient Prescriptions on File Prior to Visit  Medication Sig Dispense Refill  . cetirizine (ZYRTEC) 10 MG tablet Take 1 tablet (10 mg total) by mouth daily as needed for allergies or rhinitis (cough). 90 tablet 1  . dorzolamide (TRUSOPT) 2 % ophthalmic solution     . latanoprost (XALATAN) 0.005 % ophthalmic solution     . metoprolol succinate (TOPROL-XL) 25 MG 24 hr tablet Take 1 tablet by mouth  daily in the evening 90 tablet 0  . potassium chloride SA (K-DUR,KLOR-CON) 20 MEQ tablet Take 1 tablet by mouth  daily 90 tablet 0  . triamterene-hydrochlorothiazide (MAXZIDE-25) 37.5-25 MG per tablet Take 1 tablet by mouth  daily in the morning 90 tablet 0   Current Facility-Administered Medications on File Prior to Visit  Medication Dose Route Frequency Provider Last Rate Last Dose  . 0.9 %  sodium chloride infusion  500 mL Intravenous Continuous Ladene Artist, MD        No Known Allergies  Review of Systems  Review of Systems  Constitutional: Positive for malaise/fatigue. Negative for fever and chills.  HENT: Negative for congestion, hearing loss and nosebleeds.   Eyes: Negative for discharge.  Respiratory: Negative for cough,  sputum production, shortness of breath and wheezing.   Cardiovascular: Negative for chest pain, palpitations and leg swelling.  Gastrointestinal: Negative for heartburn, nausea, vomiting, abdominal pain, diarrhea, constipation and blood in stool.  Genitourinary: Positive for urgency and frequency. Negative for dysuria and hematuria.  Musculoskeletal: Negative for myalgias, back pain and falls.  Skin: Negative for rash.  Neurological: Positive for headaches. Negative for dizziness, tremors, sensory change, focal weakness, loss of consciousness and weakness.  Endo/Heme/Allergies: Negative for polydipsia. Does not bruise/bleed easily.  Psychiatric/Behavioral: Negative for depression and suicidal ideas. The patient is not  nervous/anxious and does not have insomnia.     Objective  BP 120/72 mmHg  Pulse 92  Temp(Src) 98.1 F (36.7 C) (Oral)  Ht 5\' 8"  (1.727 m)  Wt 177 lb 2 oz (80.343 kg)  BMI 26.94 kg/m2  SpO2 95%  Physical Exam  Physical Exam  Constitutional: She is oriented to person, place, and time and well-developed, well-nourished, and in no distress. No distress.  HENT:  Head: Normocephalic and atraumatic.  Right Ear: External ear normal.  Left Ear: External ear normal.  Nose: Nose normal.  Mouth/Throat: Oropharynx is clear and moist. No oropharyngeal exudate.  Eyes: Conjunctivae are normal. Pupils are equal, round, and reactive to light. Right eye exhibits no discharge. Left eye exhibits no discharge. No scleral icterus.  Neck: Normal range of motion. Neck supple. No thyromegaly present.  Cardiovascular: Normal rate, regular rhythm, normal heart sounds and intact distal pulses.   No murmur heard. Pulmonary/Chest: Effort normal and breath sounds normal. No respiratory distress. She has no wheezes. She has no rales.  Abdominal: Soft. Bowel sounds are normal. She exhibits no distension and no mass. There is no tenderness.  Musculoskeletal: Normal range of motion. She exhibits no edema or tenderness.  Lymphadenopathy:    She has no cervical adenopathy.  Neurological: She is alert and oriented to person, place, and time. She has normal reflexes. No cranial nerve deficit. Coordination normal.  Skin: Skin is warm and dry. No rash noted. She is not diaphoretic.  Psychiatric: Mood, memory and affect normal.    Lab Results  Component Value Date   TSH 1.425 03/14/2013   Lab Results  Component Value Date   WBC 5.4 03/14/2013   HGB 14.9 03/14/2013   HCT 44.0 03/14/2013   MCV 86.6 03/14/2013   PLT 287 03/14/2013   Lab Results  Component Value Date   CREATININE 0.83 03/14/2013   BUN 13 03/14/2013   NA 137 03/14/2013   K 4.8 03/14/2013   CL 103 03/14/2013   CO2 26 03/14/2013   Lab  Results  Component Value Date   ALT 13 03/14/2013   AST 17 03/14/2013   ALKPHOS 76 03/14/2013   BILITOT 0.5 03/14/2013   Lab Results  Component Value Date   CHOL 286* 03/14/2013   Lab Results  Component Value Date   HDL 67 03/14/2013   Lab Results  Component Value Date   LDLCALC 175* 03/14/2013   Lab Results  Component Value Date   TRIG 220* 03/14/2013   Lab Results  Component Value Date   CHOLHDL 4.3 03/14/2013     Assessment & Plan  No problem-specific assessment & plan notes found for this encounter.

## 2014-10-01 NOTE — Patient Instructions (Signed)
Preventive Care for Adults A healthy lifestyle and preventive care can promote health and wellness. Preventive health guidelines for women include the following key practices.  A routine yearly physical is a good way to check with your health care provider about your health and preventive screening. It is a chance to share any concerns and updates on your health and to receive a thorough exam.  Visit your dentist for a routine exam and preventive care every 6 months. Brush your teeth twice a day and floss once a day. Good oral hygiene prevents tooth decay and gum disease.  The frequency of eye exams is based on your age, health, family medical history, use of contact lenses, and other factors. Follow your health care provider's recommendations for frequency of eye exams.  Eat a healthy diet. Foods like vegetables, fruits, whole grains, low-fat dairy products, and lean protein foods contain the nutrients you need without too many calories. Decrease your intake of foods high in solid fats, added sugars, and salt. Eat the right amount of calories for you.Get information about a proper diet from your health care provider, if necessary.  Regular physical exercise is one of the most important things you can do for your health. Most adults should get at least 150 minutes of moderate-intensity exercise (any activity that increases your heart rate and causes you to sweat) each week. In addition, most adults need muscle-strengthening exercises on 2 or more days a week.  Maintain a healthy weight. The body mass index (BMI) is a screening tool to identify possible weight problems. It provides an estimate of body fat based on height and weight. Your health care provider can find your BMI and can help you achieve or maintain a healthy weight.For adults 20 years and older:  A BMI below 18.5 is considered underweight.  A BMI of 18.5 to 24.9 is normal.  A BMI of 25 to 29.9 is considered overweight.  A BMI of  30 and above is considered obese.  Maintain normal blood lipids and cholesterol levels by exercising and minimizing your intake of saturated fat. Eat a balanced diet with plenty of fruit and vegetables. Blood tests for lipids and cholesterol should begin at age 76 and be repeated every 5 years. If your lipid or cholesterol levels are high, you are over 50, or you are at high risk for heart disease, you may need your cholesterol levels checked more frequently.Ongoing high lipid and cholesterol levels should be treated with medicines if diet and exercise are not working.  If you smoke, find out from your health care provider how to quit. If you do not use tobacco, do not start.  Lung cancer screening is recommended for adults aged 22-80 years who are at high risk for developing lung cancer because of a history of smoking. A yearly low-dose CT scan of the lungs is recommended for people who have at least a 30-pack-year history of smoking and are a current smoker or have quit within the past 15 years. A pack year of smoking is smoking an average of 1 pack of cigarettes a day for 1 year (for example: 1 pack a day for 30 years or 2 packs a day for 15 years). Yearly screening should continue until the smoker has stopped smoking for at least 15 years. Yearly screening should be stopped for people who develop a health problem that would prevent them from having lung cancer treatment.  If you are pregnant, do not drink alcohol. If you are breastfeeding,  be very cautious about drinking alcohol. If you are not pregnant and choose to drink alcohol, do not have more than 1 drink per day. One drink is considered to be 12 ounces (355 mL) of beer, 5 ounces (148 mL) of wine, or 1.5 ounces (44 mL) of liquor.  Avoid use of street drugs. Do not share needles with anyone. Ask for help if you need support or instructions about stopping the use of drugs.  High blood pressure causes heart disease and increases the risk of  stroke. Your blood pressure should be checked at least every 1 to 2 years. Ongoing high blood pressure should be treated with medicines if weight loss and exercise do not work.  If you are 3-86 years old, ask your health care provider if you should take aspirin to prevent strokes.  Diabetes screening involves taking a blood sample to check your fasting blood sugar level. This should be done once every 3 years, after age 67, if you are within normal weight and without risk factors for diabetes. Testing should be considered at a younger age or be carried out more frequently if you are overweight and have at least 1 risk factor for diabetes.  Breast cancer screening is essential preventive care for women. You should practice "breast self-awareness." This means understanding the normal appearance and feel of your breasts and may include breast self-examination. Any changes detected, no matter how small, should be reported to a health care provider. Women in their 8s and 30s should have a clinical breast exam (CBE) by a health care provider as part of a regular health exam every 1 to 3 years. After age 70, women should have a CBE every year. Starting at age 25, women should consider having a mammogram (breast X-ray test) every year. Women who have a family history of breast cancer should talk to their health care provider about genetic screening. Women at a high risk of breast cancer should talk to their health care providers about having an MRI and a mammogram every year.  Breast cancer gene (BRCA)-related cancer risk assessment is recommended for women who have family members with BRCA-related cancers. BRCA-related cancers include breast, ovarian, tubal, and peritoneal cancers. Having family members with these cancers may be associated with an increased risk for harmful changes (mutations) in the breast cancer genes BRCA1 and BRCA2. Results of the assessment will determine the need for genetic counseling and  BRCA1 and BRCA2 testing.  Routine pelvic exams to screen for cancer are no longer recommended for nonpregnant women who are considered low risk for cancer of the pelvic organs (ovaries, uterus, and vagina) and who do not have symptoms. Ask your health care provider if a screening pelvic exam is right for you.  If you have had past treatment for cervical cancer or a condition that could lead to cancer, you need Pap tests and screening for cancer for at least 20 years after your treatment. If Pap tests have been discontinued, your risk factors (such as having a new sexual partner) need to be reassessed to determine if screening should be resumed. Some women have medical problems that increase the chance of getting cervical cancer. In these cases, your health care provider may recommend more frequent screening and Pap tests.  The HPV test is an additional test that may be used for cervical cancer screening. The HPV test looks for the virus that can cause the cell changes on the cervix. The cells collected during the Pap test can be  tested for HPV. The HPV test could be used to screen women aged 30 years and older, and should be used in women of any age who have unclear Pap test results. After the age of 30, women should have HPV testing at the same frequency as a Pap test.  Colorectal cancer can be detected and often prevented. Most routine colorectal cancer screening begins at the age of 50 years and continues through age 75 years. However, your health care provider may recommend screening at an earlier age if you have risk factors for colon cancer. On a yearly basis, your health care provider may provide home test kits to check for hidden blood in the stool. Use of a small camera at the end of a tube, to directly examine the colon (sigmoidoscopy or colonoscopy), can detect the earliest forms of colorectal cancer. Talk to your health care provider about this at age 50, when routine screening begins. Direct  exam of the colon should be repeated every 5-10 years through age 75 years, unless early forms of pre-cancerous polyps or small growths are found.  People who are at an increased risk for hepatitis B should be screened for this virus. You are considered at high risk for hepatitis B if:  You were born in a country where hepatitis B occurs often. Talk with your health care provider about which countries are considered high risk.  Your parents were born in a high-risk country and you have not received a shot to protect against hepatitis B (hepatitis B vaccine).  You have HIV or AIDS.  You use needles to inject street drugs.  You live with, or have sex with, someone who has hepatitis B.  You get hemodialysis treatment.  You take certain medicines for conditions like cancer, organ transplantation, and autoimmune conditions.  Hepatitis C blood testing is recommended for all people born from 1945 through 1965 and any individual with known risks for hepatitis C.  Practice safe sex. Use condoms and avoid high-risk sexual practices to reduce the spread of sexually transmitted infections (STIs). STIs include gonorrhea, chlamydia, syphilis, trichomonas, herpes, HPV, and human immunodeficiency virus (HIV). Herpes, HIV, and HPV are viral illnesses that have no cure. They can result in disability, cancer, and death.  You should be screened for sexually transmitted illnesses (STIs) including gonorrhea and chlamydia if:  You are sexually active and are younger than 24 years.  You are older than 24 years and your health care provider tells you that you are at risk for this type of infection.  Your sexual activity has changed since you were last screened and you are at an increased risk for chlamydia or gonorrhea. Ask your health care provider if you are at risk.  If you are at risk of being infected with HIV, it is recommended that you take a prescription medicine daily to prevent HIV infection. This is  called preexposure prophylaxis (PrEP). You are considered at risk if:  You are a heterosexual woman, are sexually active, and are at increased risk for HIV infection.  You take drugs by injection.  You are sexually active with a partner who has HIV.  Talk with your health care provider about whether you are at high risk of being infected with HIV. If you choose to begin PrEP, you should first be tested for HIV. You should then be tested every 3 months for as long as you are taking PrEP.  Osteoporosis is a disease in which the bones lose minerals and strength   with aging. This can result in serious bone fractures or breaks. The risk of osteoporosis can be identified using a bone density scan. Women ages 65 years and over and women at risk for fractures or osteoporosis should discuss screening with their health care providers. Ask your health care provider whether you should take a calcium supplement or vitamin D to reduce the rate of osteoporosis.  Menopause can be associated with physical symptoms and risks. Hormone replacement therapy is available to decrease symptoms and risks. You should talk to your health care provider about whether hormone replacement therapy is right for you.  Use sunscreen. Apply sunscreen liberally and repeatedly throughout the day. You should seek shade when your shadow is shorter than you. Protect yourself by wearing long sleeves, pants, a wide-brimmed hat, and sunglasses year round, whenever you are outdoors.  Once a month, do a whole body skin exam, using a mirror to look at the skin on your back. Tell your health care provider of new moles, moles that have irregular borders, moles that are larger than a pencil eraser, or moles that have changed in shape or color.  Stay current with required vaccines (immunizations).  Influenza vaccine. All adults should be immunized every year.  Tetanus, diphtheria, and acellular pertussis (Td, Tdap) vaccine. Pregnant women should  receive 1 dose of Tdap vaccine during each pregnancy. The dose should be obtained regardless of the length of time since the last dose. Immunization is preferred during the 27th-36th week of gestation. An adult who has not previously received Tdap or who does not know her vaccine status should receive 1 dose of Tdap. This initial dose should be followed by tetanus and diphtheria toxoids (Td) booster doses every 10 years. Adults with an unknown or incomplete history of completing a 3-dose immunization series with Td-containing vaccines should begin or complete a primary immunization series including a Tdap dose. Adults should receive a Td booster every 10 years.  Varicella vaccine. An adult without evidence of immunity to varicella should receive 2 doses or a second dose if she has previously received 1 dose. Pregnant females who do not have evidence of immunity should receive the first dose after pregnancy. This first dose should be obtained before leaving the health care facility. The second dose should be obtained 4-8 weeks after the first dose.  Human papillomavirus (HPV) vaccine. Females aged 13-26 years who have not received the vaccine previously should obtain the 3-dose series. The vaccine is not recommended for use in pregnant females. However, pregnancy testing is not needed before receiving a dose. If a female is found to be pregnant after receiving a dose, no treatment is needed. In that case, the remaining doses should be delayed until after the pregnancy. Immunization is recommended for any person with an immunocompromised condition through the age of 26 years if she did not get any or all doses earlier. During the 3-dose series, the second dose should be obtained 4-8 weeks after the first dose. The third dose should be obtained 24 weeks after the first dose and 16 weeks after the second dose.  Zoster vaccine. One dose is recommended for adults aged 60 years or older unless certain conditions are  present.  Measles, mumps, and rubella (MMR) vaccine. Adults born before 1957 generally are considered immune to measles and mumps. Adults born in 1957 or later should have 1 or more doses of MMR vaccine unless there is a contraindication to the vaccine or there is laboratory evidence of immunity to   each of the three diseases. A routine second dose of MMR vaccine should be obtained at least 28 days after the first dose for students attending postsecondary schools, health care workers, or international travelers. People who received inactivated measles vaccine or an unknown type of measles vaccine during 1963-1967 should receive 2 doses of MMR vaccine. People who received inactivated mumps vaccine or an unknown type of mumps vaccine before 1979 and are at high risk for mumps infection should consider immunization with 2 doses of MMR vaccine. For females of childbearing age, rubella immunity should be determined. If there is no evidence of immunity, females who are not pregnant should be vaccinated. If there is no evidence of immunity, females who are pregnant should delay immunization until after pregnancy. Unvaccinated health care workers born before 1957 who lack laboratory evidence of measles, mumps, or rubella immunity or laboratory confirmation of disease should consider measles and mumps immunization with 2 doses of MMR vaccine or rubella immunization with 1 dose of MMR vaccine.  Pneumococcal 13-valent conjugate (PCV13) vaccine. When indicated, a person who is uncertain of her immunization history and has no record of immunization should receive the PCV13 vaccine. An adult aged 19 years or older who has certain medical conditions and has not been previously immunized should receive 1 dose of PCV13 vaccine. This PCV13 should be followed with a dose of pneumococcal polysaccharide (PPSV23) vaccine. The PPSV23 vaccine dose should be obtained at least 8 weeks after the dose of PCV13 vaccine. An adult aged 19  years or older who has certain medical conditions and previously received 1 or more doses of PPSV23 vaccine should receive 1 dose of PCV13. The PCV13 vaccine dose should be obtained 1 or more years after the last PPSV23 vaccine dose.  Pneumococcal polysaccharide (PPSV23) vaccine. When PCV13 is also indicated, PCV13 should be obtained first. All adults aged 65 years and older should be immunized. An adult younger than age 65 years who has certain medical conditions should be immunized. Any person who resides in a nursing home or long-term care facility should be immunized. An adult smoker should be immunized. People with an immunocompromised condition and certain other conditions should receive both PCV13 and PPSV23 vaccines. People with human immunodeficiency virus (HIV) infection should be immunized as soon as possible after diagnosis. Immunization during chemotherapy or radiation therapy should be avoided. Routine use of PPSV23 vaccine is not recommended for American Indians, [place] Natives, or people younger than 65 years unless there are medical conditions that require PPSV23 vaccine. When indicated, people who have unknown immunization and have no record of immunization should receive PPSV23 vaccine. One-time revaccination 5 years after the first dose of PPSV23 is recommended for people aged 19-64 years who have chronic kidney failure, nephrotic syndrome, asplenia, or immunocompromised conditions. People who received 1-2 doses of PPSV23 before age 65 years should receive another dose of PPSV23 vaccine at age 65 years or later if at least 5 years have passed since the previous dose. Doses of PPSV23 are not needed for people immunized with PPSV23 at or after age 65 years.  Meningococcal vaccine. Adults with asplenia or persistent complement component deficiencies should receive 2 doses of quadrivalent meningococcal conjugate (MenACWY-D) vaccine. The doses should be obtained at least 2 months apart.  Microbiologists working with certain meningococcal bacteria, military recruits, people at risk during an outbreak, and people who travel to or live in countries with a high rate of meningitis should be immunized. A first-year college student up through age   21 years who is living in a residence hall should receive a dose if she did not receive a dose on or after her 16th birthday. Adults who have certain high-risk conditions should receive one or more doses of vaccine.  Hepatitis A vaccine. Adults who wish to be protected from this disease, have certain high-risk conditions, work with hepatitis A-infected animals, work in hepatitis A research labs, or travel to or work in countries with a high rate of hepatitis A should be immunized. Adults who were previously unvaccinated and who anticipate close contact with an international adoptee during the first 60 days after arrival in the [place] from a country with a high rate of hepatitis A should be immunized.  Hepatitis B vaccine. Adults who wish to be protected from this disease, have certain high-risk conditions, may be exposed to blood or other infectious body fluids, are household contacts or sex partners of hepatitis B positive people, are clients or workers in certain care facilities, or travel to or work in countries with a high rate of hepatitis B should be immunized.  Haemophilus influenzae type b (Hib) vaccine. A previously unvaccinated person with asplenia or sickle cell disease or having a scheduled splenectomy should receive 1 dose of Hib vaccine. Regardless of previous immunization, a recipient of a hematopoietic stem cell transplant should receive a 3-dose series 6-12 months after her successful transplant. Hib vaccine is not recommended for adults with HIV infection. Preventive Services / Frequency Ages 64 to 68 years  Blood pressure check.** / Every 1 to 2 years.  Lipid and cholesterol check.** / Every 5 years beginning at age  22.  Clinical breast exam.** / Every 3 years for women in their 88s and 53s.  BRCA-related cancer risk assessment.** / For women who have family members with a BRCA-related cancer (breast, ovarian, tubal, or peritoneal cancers).  Pap test.** / Every 2 years from ages 90 through 51. Every 3 years starting at age 21 through age 56 or 3 with a history of 3 consecutive normal Pap tests.  HPV screening.** / Every 3 years from ages 24 through ages 1 to 46 with a history of 3 consecutive normal Pap tests.  Hepatitis C blood test.** / For any individual with known risks for hepatitis C.  Skin self-exam. / Monthly.  Influenza vaccine. / Every year.  Tetanus, diphtheria, and acellular pertussis (Tdap, Td) vaccine.** / Consult your health care provider. Pregnant women should receive 1 dose of Tdap vaccine during each pregnancy. 1 dose of Td every 10 years.  Varicella vaccine.** / Consult your health care provider. Pregnant females who do not have evidence of immunity should receive the first dose after pregnancy.  HPV vaccine. / 3 doses over 6 months, if 72 and younger. The vaccine is not recommended for use in pregnant females. However, pregnancy testing is not needed before receiving a dose.  Measles, mumps, rubella (MMR) vaccine.** / You need at least 1 dose of MMR if you were born in 1957 or later. You may also need a 2nd dose. For females of childbearing age, rubella immunity should be determined. If there is no evidence of immunity, females who are not pregnant should be vaccinated. If there is no evidence of immunity, females who are pregnant should delay immunization until after pregnancy.  Pneumococcal 13-valent conjugate (PCV13) vaccine.** / Consult your health care provider.  Pneumococcal polysaccharide (PPSV23) vaccine.** / 1 to 2 doses if you smoke cigarettes or if you have certain conditions.  Meningococcal vaccine.** /  1 dose if you are age 19 to 21 years and a first-year college  student living in a residence hall, or have one of several medical conditions, you need to get vaccinated against meningococcal disease. You may also need additional booster doses.  Hepatitis A vaccine.** / Consult your health care provider.  Hepatitis B vaccine.** / Consult your health care provider.  Haemophilus influenzae type b (Hib) vaccine.** / Consult your health care provider. Ages 40 to 64 years  Blood pressure check.** / Every 1 to 2 years.  Lipid and cholesterol check.** / Every 5 years beginning at age 20 years.  Lung cancer screening. / Every year if you are aged 55-80 years and have a 30-pack-year history of smoking and currently smoke or have quit within the past 15 years. Yearly screening is stopped once you have quit smoking for at least 15 years or develop a health problem that would prevent you from having lung cancer treatment.  Clinical breast exam.** / Every year after age 40 years.  BRCA-related cancer risk assessment.** / For women who have family members with a BRCA-related cancer (breast, ovarian, tubal, or peritoneal cancers).  Mammogram.** / Every year beginning at age 40 years and continuing for as long as you are in good health. Consult with your health care provider.  Pap test.** / Every 3 years starting at age 30 years through age 65 or 70 years with a history of 3 consecutive normal Pap tests.  HPV screening.** / Every 3 years from ages 30 years through ages 65 to 70 years with a history of 3 consecutive normal Pap tests.  Fecal occult blood test (FOBT) of stool. / Every year beginning at age 50 years and continuing until age 75 years. You may not need to do this test if you get a colonoscopy every 10 years.  Flexible sigmoidoscopy or colonoscopy.** / Every 5 years for a flexible sigmoidoscopy or every 10 years for a colonoscopy beginning at age 50 years and continuing until age 75 years.  Hepatitis C blood test.** / For all people born from 1945 through  1965 and any individual with known risks for hepatitis C.  Skin self-exam. / Monthly.  Influenza vaccine. / Every year.  Tetanus, diphtheria, and acellular pertussis (Tdap/Td) vaccine.** / Consult your health care provider. Pregnant women should receive 1 dose of Tdap vaccine during each pregnancy. 1 dose of Td every 10 years.  Varicella vaccine.** / Consult your health care provider. Pregnant females who do not have evidence of immunity should receive the first dose after pregnancy.  Zoster vaccine.** / 1 dose for adults aged 60 years or older.  Measles, mumps, rubella (MMR) vaccine.** / You need at least 1 dose of MMR if you were born in 1957 or later. You may also need a 2nd dose. For females of childbearing age, rubella immunity should be determined. If there is no evidence of immunity, females who are not pregnant should be vaccinated. If there is no evidence of immunity, females who are pregnant should delay immunization until after pregnancy.  Pneumococcal 13-valent conjugate (PCV13) vaccine.** / Consult your health care provider.  Pneumococcal polysaccharide (PPSV23) vaccine.** / 1 to 2 doses if you smoke cigarettes or if you have certain conditions.  Meningococcal vaccine.** / Consult your health care provider.  Hepatitis A vaccine.** / Consult your health care provider.  Hepatitis B vaccine.** / Consult your health care provider.  Haemophilus influenzae type b (Hib) vaccine.** / Consult your health care provider. Ages 65   years and over  Blood pressure check.** / Every 1 to 2 years.  Lipid and cholesterol check.** / Every 5 years beginning at age 22 years.  Lung cancer screening. / Every year if you are aged 73-80 years and have a 30-pack-year history of smoking and currently smoke or have quit within the past 15 years. Yearly screening is stopped once you have quit smoking for at least 15 years or develop a health problem that would prevent you from having lung cancer  treatment.  Clinical breast exam.** / Every year after age 4 years.  BRCA-related cancer risk assessment.** / For women who have family members with a BRCA-related cancer (breast, ovarian, tubal, or peritoneal cancers).  Mammogram.** / Every year beginning at age 40 years and continuing for as long as you are in good health. Consult with your health care provider.  Pap test.** / Every 3 years starting at age 9 years through age 34 or 91 years with 3 consecutive normal Pap tests. Testing can be stopped between 65 and 70 years with 3 consecutive normal Pap tests and no abnormal Pap or HPV tests in the past 10 years.  HPV screening.** / Every 3 years from ages 57 years through ages 64 or 45 years with a history of 3 consecutive normal Pap tests. Testing can be stopped between 65 and 70 years with 3 consecutive normal Pap tests and no abnormal Pap or HPV tests in the past 10 years.  Fecal occult blood test (FOBT) of stool. / Every year beginning at age 15 years and continuing until age 17 years. You may not need to do this test if you get a colonoscopy every 10 years.  Flexible sigmoidoscopy or colonoscopy.** / Every 5 years for a flexible sigmoidoscopy or every 10 years for a colonoscopy beginning at age 86 years and continuing until age 71 years.  Hepatitis C blood test.** / For all people born from [birth year] through 1965 and any individual with known risks for hepatitis C.  Osteoporosis screening.** / A one-time screening for women ages 83 years and over and women at risk for fractures or osteoporosis.  Skin self-exam. / Monthly.  Influenza vaccine. / Every year.  Tetanus, diphtheria, and acellular pertussis (Tdap/Td) vaccine.** / 1 dose of Td every 10 years.  Varicella vaccine.** / Consult your health care provider.  Zoster vaccine.** / 1 dose for adults aged 61 years or older.  Pneumococcal 13-valent conjugate (PCV13) vaccine.** / Consult your health care provider.  Pneumococcal  polysaccharide (PPSV23) vaccine.** / 1 dose for all adults aged 28 years and older.  Meningococcal vaccine.** / Consult your health care provider.  Hepatitis A vaccine.** / Consult your health care provider.  Hepatitis B vaccine.** / Consult your health care provider.  Haemophilus influenzae type b (Hib) vaccine.** / Consult your health care provider. ** Family history and personal history of risk and conditions may change your health care provider's recommendations. Document Released: 08/03/2001 Document Revised: 10/22/2013 Document Reviewed: 11/02/2010 [facility] Patient Information 2015 [place], [place]. This information is not intended to replace advice given to you by your health care provider. Make sure you discuss any questions you have with your health care provider.

## 2014-10-01 NOTE — Progress Notes (Signed)
Pre visit review using our clinic review tool, if applicable. No additional management support is needed unless otherwise documented below in the visit note. 

## 2014-10-02 LAB — CBC
HCT: 42 % (ref 36.0–46.0)
Hemoglobin: 14.1 g/dL (ref 12.0–15.0)
MCHC: 33.6 g/dL (ref 30.0–36.0)
MCV: 87.8 fl (ref 78.0–100.0)
PLATELETS: 318 10*3/uL (ref 150.0–400.0)
RBC: 4.78 Mil/uL (ref 3.87–5.11)
RDW: 14.2 % (ref 11.5–15.5)
WBC: 6.7 10*3/uL (ref 4.0–10.5)

## 2014-10-02 LAB — LDL CHOLESTEROL, DIRECT: Direct LDL: 159 mg/dL

## 2014-10-02 LAB — COMPREHENSIVE METABOLIC PANEL
ALT: 9 U/L (ref 0–35)
AST: 13 U/L (ref 0–37)
Albumin: 4.1 g/dL (ref 3.5–5.2)
Alkaline Phosphatase: 81 U/L (ref 39–117)
BUN: 19 mg/dL (ref 6–23)
CO2: 25 mEq/L (ref 19–32)
CREATININE: 0.91 mg/dL (ref 0.40–1.20)
Calcium: 9.4 mg/dL (ref 8.4–10.5)
Chloride: 102 mEq/L (ref 96–112)
GFR: 64.29 mL/min (ref >60.00)
GLUCOSE: 91 mg/dL (ref 70–99)
Potassium: 4.3 mEq/L (ref 3.5–5.1)
SODIUM: 138 meq/L (ref 135–145)
Total Bilirubin: 0.3 mg/dL (ref 0.2–1.2)
Total Protein: 6.8 g/dL (ref 6.0–8.3)

## 2014-10-02 LAB — LIPID PANEL
Cholesterol: 278 mg/dL — ABNORMAL HIGH (ref 0–200)
HDL: 60.9 mg/dL (ref >39.00)
NONHDL: 217.1
TRIGLYCERIDES: 345 mg/dL — AB (ref 0.0–149.0)
Total CHOL/HDL Ratio: 5
VLDL: 69 mg/dL — ABNORMAL HIGH (ref 0.0–40.0)

## 2014-10-02 LAB — TSH: TSH: 1.74 u[IU]/mL (ref 0.35–4.50)

## 2014-10-06 DIAGNOSIS — R0683 Snoring: Secondary | ICD-10-CM | POA: Insufficient documentation

## 2014-10-06 DIAGNOSIS — Z0000 Encounter for general adult medical examination without abnormal findings: Secondary | ICD-10-CM | POA: Insufficient documentation

## 2014-10-06 DIAGNOSIS — L578 Other skin changes due to chronic exposure to nonionizing radiation: Secondary | ICD-10-CM | POA: Insufficient documentation

## 2014-10-06 NOTE — Assessment & Plan Note (Addendum)
Patient encouraged to maintain heart healthy diet, regular exercise, adequate sleep. Consider daily probiotics. Take medications as prescribed. Pap done in 2012 was normal. No history of abnormal encouraged to proceed with pelvic exam in next year. Patient declines for now. Last MGM in 2014 encouraged to repeat this year. No concerns identified. Annual labs ordered and reviewed.

## 2014-10-06 NOTE — Assessment & Plan Note (Signed)
Patient noting restless sleep, am HA and fatigue. Will proceed with sleep study

## 2014-10-06 NOTE — Assessment & Plan Note (Signed)
Referred to dermatology for further consideration.  

## 2014-10-06 NOTE — Assessment & Plan Note (Signed)
Encouraged heart healthy diet, increase exercise, avoid trans fats, consider a krill oil cap daily 

## 2014-10-06 NOTE — Assessment & Plan Note (Signed)
Patient denies any difficulties at home. No trouble with ADLs, depression or falls. No recent changes to vision or hearing. Is UTD with immunizations. Is UTD with screening. Discussed Advanced Directives, patient agrees to bring [place] copies of documents if can. Encouraged heart healthy diet, exercise as tolerated and adequate sleep Follows with gastroenterology, dr Fuller Plan, colonoscopy in 2012, repeat in 10 years MGM was 2014 Pap was 2012 Podiatry Dr Aviva Signs ordered and reviewed See AVS for preventative health recommendations See problem list for patient risk factors. Patient declines Dexa scan

## 2014-10-06 NOTE — Assessment & Plan Note (Signed)
Well controlled, no changes to meds. Encouraged heart healthy diet such as the DASH diet and exercise as tolerated.  °

## 2014-12-11 DIAGNOSIS — H4011X1 Primary open-angle glaucoma, mild stage: Secondary | ICD-10-CM | POA: Diagnosis not present

## 2014-12-15 ENCOUNTER — Encounter (HOSPITAL_BASED_OUTPATIENT_CLINIC_OR_DEPARTMENT_OTHER): Payer: Medicare Other

## 2014-12-30 ENCOUNTER — Ambulatory Visit (INDEPENDENT_AMBULATORY_CARE_PROVIDER_SITE_OTHER): Payer: Medicare Other | Admitting: Family Medicine

## 2014-12-30 ENCOUNTER — Encounter: Payer: Self-pay | Admitting: Family Medicine

## 2014-12-30 VITALS — BP 144/80 | HR 74 | Temp 98.4°F | Ht 68.0 in | Wt 173.4 lb

## 2014-12-30 DIAGNOSIS — E785 Hyperlipidemia, unspecified: Secondary | ICD-10-CM | POA: Diagnosis not present

## 2014-12-30 DIAGNOSIS — R109 Unspecified abdominal pain: Secondary | ICD-10-CM | POA: Diagnosis not present

## 2014-12-30 DIAGNOSIS — E782 Mixed hyperlipidemia: Secondary | ICD-10-CM

## 2014-12-30 DIAGNOSIS — I10 Essential (primary) hypertension: Secondary | ICD-10-CM | POA: Diagnosis not present

## 2014-12-30 NOTE — Assessment & Plan Note (Signed)
Improved on recheck no changes today, minimize caffeine and sodium and recheck at next visit

## 2014-12-30 NOTE — Progress Notes (Signed)
Pre visit review using our clinic review tool, if applicable. No additional management support is needed unless otherwise documented below in the visit note. 

## 2014-12-30 NOTE — Assessment & Plan Note (Addendum)
Recheck level today, has added Krill oil caps and tries to maintain a heart healthy diet and encouraged to start a statin

## 2014-12-30 NOTE — Patient Instructions (Addendum)
Minimize sodium and caffeine  Dermatology [place]   Hypertension Hypertension, commonly called high blood pressure, is when the force of blood pumping through your arteries is too strong. Your arteries are the blood vessels that carry blood from your heart throughout your body. A blood pressure reading consists of a higher number over a lower number, such as 110/72. The higher number (systolic) is the pressure inside your arteries when your heart pumps. The lower number (diastolic) is the pressure inside your arteries when your heart relaxes. Ideally you want your blood pressure below 120/80. Hypertension forces your heart to work harder to pump blood. Your arteries may become narrow or stiff. Having hypertension puts you at risk for heart disease, stroke, and other problems.  RISK FACTORS Some risk factors for high blood pressure are controllable. Others are not.  Risk factors you cannot control include:   Race. You may be at higher risk if you are African American.  Age. Risk increases with age.  Gender. Men are at higher risk than women before age 13 years. After age 36, women are at higher risk than men. Risk factors you can control include:  Not getting enough exercise or physical activity.  Being overweight.  Getting too much fat, sugar, calories, or salt in your diet.  Drinking too much alcohol. SIGNS AND SYMPTOMS Hypertension does not usually cause signs or symptoms. Extremely high blood pressure (hypertensive crisis) may cause headache, anxiety, shortness of breath, and nosebleed. DIAGNOSIS  To check if you have hypertension, your health care provider will measure your blood pressure while you are seated, with your arm held at the level of your heart. It should be measured at least twice using the same arm. Certain conditions can cause a difference in blood pressure between your right and left arms. A blood pressure reading that is higher than normal on one occasion  does not mean that you need treatment. If one blood pressure reading is high, ask your health care provider about having it checked again. TREATMENT  Treating high blood pressure includes making lifestyle changes and possibly taking medicine. Living a healthy lifestyle can help lower high blood pressure. You may need to change some of your habits. Lifestyle changes may include:  Following the DASH diet. This diet is high in fruits, vegetables, and whole grains. It is low in salt, red meat, and added sugars.  Getting at least 2 hours of brisk physical activity every week.  Losing weight if necessary.  Not smoking.  Limiting alcoholic beverages.  Learning ways to reduce stress. If lifestyle changes are not enough to get your blood pressure under control, your health care provider may prescribe medicine. You may need to take more than one. Work closely with your health care provider to understand the risks and benefits. HOME CARE INSTRUCTIONS  Have your blood pressure rechecked as directed by your health care provider.   Take medicines only as directed by your health care provider. Follow the directions carefully. Blood pressure medicines must be taken as prescribed. The medicine does not work as well when you skip doses. Skipping doses also puts you at risk for problems.   Do not smoke.   Monitor your blood pressure at home as directed by your health care provider. SEEK MEDICAL CARE IF:   You think you are having a reaction to medicines taken.  You have recurrent headaches or feel dizzy.  You have swelling in your ankles.  You have trouble with your vision. [place]  IF:  You develop a severe headache or confusion.  You have unusual weakness, numbness, or feel faint.  You have severe chest or abdominal pain.  You vomit repeatedly.  You have trouble breathing. MAKE SURE YOU:   Understand these instructions.  Will watch your condition.  Will get  help right away if you are not doing well or get worse. Document Released: 06/07/2005 Document Revised: 10/22/2013 Document Reviewed: 03/30/2013 [facility] Patient Information 2015 [place], [place]. This information is not intended to replace advice given to you by your health care provider. Make sure you discuss any questions you have with your health care provider.

## 2014-12-31 LAB — COMPREHENSIVE METABOLIC PANEL
ALBUMIN: 4.1 g/dL (ref 3.5–5.2)
ALT: 13 U/L (ref 0–35)
AST: 18 U/L (ref 0–37)
Alkaline Phosphatase: 66 U/L (ref 39–117)
BUN: 16 mg/dL (ref 6–23)
CALCIUM: 9.6 mg/dL (ref 8.4–10.5)
CO2: 27 mEq/L (ref 19–32)
Chloride: 104 mEq/L (ref 96–112)
Creatinine, Ser: 0.83 mg/dL (ref 0.40–1.20)
GFR: 71.45 mL/min (ref >60.00)
GLUCOSE: 69 mg/dL — AB (ref 70–99)
Potassium: 4.2 mEq/L (ref 3.5–5.1)
Sodium: 138 mEq/L (ref 135–145)
Total Bilirubin: 0.5 mg/dL (ref 0.2–1.2)
Total Protein: 6.9 g/dL (ref 6.0–8.3)

## 2014-12-31 LAB — URINALYSIS
Bilirubin Urine: NEGATIVE
Hgb urine dipstick: NEGATIVE
KETONES UR: NEGATIVE
LEUKOCYTES UA: NEGATIVE
NITRITE: NEGATIVE
Specific Gravity, Urine: 1.02 (ref 1.000–1.030)
TOTAL PROTEIN, URINE-UPE24: NEGATIVE
URINE GLUCOSE: NEGATIVE
Urobilinogen, UA: 0.2 (ref 0.0–1.0)
pH: 5.5 (ref 5.0–8.0)

## 2014-12-31 LAB — CBC
HCT: 43 % (ref 36.0–46.0)
HEMOGLOBIN: 14.3 g/dL (ref 12.0–15.0)
MCHC: 33.3 g/dL (ref 30.0–36.0)
MCV: 88.6 fl (ref 78.0–100.0)
PLATELETS: 302 10*3/uL (ref 150.0–400.0)
RBC: 4.86 Mil/uL (ref 3.87–5.11)
RDW: 14.5 % (ref 11.5–15.5)
WBC: 6.7 10*3/uL (ref 4.0–10.5)

## 2014-12-31 LAB — LIPID PANEL
CHOLESTEROL: 285 mg/dL — AB (ref 0–200)
HDL: 60.3 mg/dL (ref >39.00)
NonHDL: 224.7
TRIGLYCERIDES: 288 mg/dL — AB (ref 0.0–149.0)
Total CHOL/HDL Ratio: 5
VLDL: 57.6 mg/dL — ABNORMAL HIGH (ref 0.0–40.0)

## 2014-12-31 LAB — TSH: TSH: 1.7 u[IU]/mL (ref 0.35–4.50)

## 2014-12-31 LAB — URINE CULTURE
Colony Count: NO GROWTH
Organism ID, Bacteria: NO GROWTH

## 2014-12-31 LAB — LDL CHOLESTEROL, DIRECT: Direct LDL: 165 mg/dL

## 2015-01-01 ENCOUNTER — Telehealth: Payer: Self-pay

## 2015-01-01 NOTE — Telephone Encounter (Signed)
Pt notified states she is already taking a krill oil. No questions at this time.

## 2015-01-01 NOTE — Telephone Encounter (Signed)
-----   Message from [name], MD sent at 12/31/2014 11:09 PM EDT ----- Notify alll labs normal except cholesterol is still up, Encouraged heart healthy diet, increase exercise, avoid trans fats, consider a krill oil cap daily

## 2015-01-07 ENCOUNTER — Telehealth: Payer: Self-pay | Admitting: Family Medicine

## 2015-01-07 NOTE — Assessment & Plan Note (Signed)
Unclear etiology, urine culture negative. Encouraged probiotics and fiber supplements. Report worsening symptoms

## 2015-01-07 NOTE — Telephone Encounter (Signed)
-----   Message from [name], MD sent at 01/07/2015  7:59 AM EDT ----- I think she might be resistent to Statins but please let her know her know her cholesterol is up enough that I would encourage a very low dose of a statin Atorvastatin 10 mg tab 1 tab po qhs if she is will willing. If not at least Encourage heart healthy diet, increase exercise, avoid trans fats, consider a krill oil cap daily

## 2015-01-07 NOTE — Telephone Encounter (Signed)
Called left message on cell number to call back

## 2015-01-07 NOTE — Progress Notes (Signed)
Jasmine Wilson  [id number] 1940-08-11 01/07/2015      Progress Note-Follow Up  Subjective  Chief Complaint  Chief Complaint  Patient presents with  . Follow-up    3 month    HPI  Patient is a 74 y.o. female in today for routine medical care. She is feeling fairly well. Continues to struggle with some left foot numbness, the pain is improved s/p surgery at [facility]. No acute concerns although she does c/o some vague abdominal discomfort at times. Notes her edema is improved with vitamin B6 supplement. Denies CP/palp/SOB/HA/congestion/fevers/GI or GU c/o. Taking meds as prescribed  Past Medical History  Diagnosis Date  . Meniere's disease   . Hearing aid worn   . Chicken pox as a child  . Shingles   . Measles as a child  . Hyperlipidemia   . Female bladder prolapse, acquired 01/29/2011  . Preventative health care 01/29/2011  . HTN (hypertension) 01/29/2011  . Glaucoma 01/29/2011  . Abdominal pain 03/02/2011  . History of pneumonia   . Bronchitis 04/04/2011  . Cataract   . Arthritis 03/02/2012  . Cough 01/29/2013  . Vaginosis 01/29/2013    Past Surgical History  Procedure Laterality Date  . Lump removed on left breast      clogged milk duct, benign  . Tubal ligation      Family History  Problem Relation Age of Onset  . Breast cancer Mother   . Stroke Father   . Heart disease Father   . Hyperlipidemia Father   . Hypertension Father   . Breast cancer Sister 77  . Uterine cancer Maternal Grandmother     uterus    History   Social History  . Marital Status: Widowed    Spouse Name: N/A  . Number of Children: 3  . Years of Education: N/A   Occupational History  . Retired    Social History Main Topics  . Smoking status: Former Smoker    Types: Cigarettes    Quit date: 06/21/1970  . Smokeless tobacco: Never Used     Comment: occasional smoker  . Alcohol Use: No  . Drug Use: No  . Sexual Activity: No   Other Topics Concern  . Not on file   Social  History Narrative    Current Outpatient Prescriptions on File Prior to Visit  Medication Sig Dispense Refill  . cetirizine (ZYRTEC) 10 MG tablet Take 1 tablet (10 mg total) by mouth daily as needed for allergies or rhinitis (cough). 90 tablet 1  . dorzolamide (TRUSOPT) 2 % ophthalmic solution     . latanoprost (XALATAN) 0.005 % ophthalmic solution     . metoprolol succinate (TOPROL-XL) 50 MG 24 hr tablet Take 1 tablet (50 mg total) by mouth daily. Take with or immediately following a meal. 90 tablet 3   No current facility-administered medications on file prior to visit.    No Known Allergies  Review of Systems  Review of Systems  Constitutional: Negative for fever and malaise/fatigue.  HENT: Negative for congestion.   Eyes: Negative for discharge.  Respiratory: Negative for shortness of breath.   Cardiovascular: Positive for leg swelling. Negative for chest pain and palpitations.       She notes edema has been improved since she started taking Vitamin B6  Gastrointestinal: Positive for abdominal pain. Negative for nausea and diarrhea.  Genitourinary: Negative for dysuria.  Musculoskeletal: Positive for joint pain. Negative for falls.  Skin: Negative for rash.  Neurological: Negative for loss of  consciousness and headaches.  Endo/Heme/Allergies: Negative for polydipsia.  Psychiatric/Behavioral: Negative for depression and suicidal ideas. The patient is not nervous/anxious and does not have insomnia.     Objective  BP 144/80 mmHg  Pulse 74  Temp(Src) 98.4 F (36.9 C) (Oral)  Ht 5\' 8"  (1.727 m)  Wt 173 lb 6 oz (78.642 kg)  BMI 26.37 kg/m2  SpO2 98%  Physical Exam  Physical Exam  Constitutional: She is oriented to person, place, and time and well-developed, well-nourished, and in no distress. No distress.  HENT:  Head: Normocephalic and atraumatic.  Eyes: Conjunctivae are normal.  Neck: Neck supple. No thyromegaly present.  Cardiovascular: Normal rate, regular rhythm  and normal heart sounds.   No murmur heard. Pulmonary/Chest: Effort normal and breath sounds normal. She has no wheezes.  Abdominal: She exhibits no distension and no mass.  Musculoskeletal: She exhibits no edema.  Lymphadenopathy:    She has no cervical adenopathy.  Neurological: She is alert and oriented to person, place, and time.  Skin: Skin is warm and dry. No rash noted. She is not diaphoretic.  Psychiatric: Memory, affect and judgment normal.    Lab Results  Component Value Date   TSH 1.70 12/30/2014   Lab Results  Component Value Date   WBC 6.7 12/30/2014   HGB 14.3 12/30/2014   HCT 43.0 12/30/2014   MCV 88.6 12/30/2014   PLT 302.0 12/30/2014   Lab Results  Component Value Date   CREATININE 0.83 12/30/2014   BUN 16 12/30/2014   NA 138 12/30/2014   K 4.2 12/30/2014   CL 104 12/30/2014   CO2 27 12/30/2014   Lab Results  Component Value Date   ALT 13 12/30/2014   AST 18 12/30/2014   ALKPHOS 66 12/30/2014   BILITOT 0.5 12/30/2014   Lab Results  Component Value Date   CHOL 285* 12/30/2014   Lab Results  Component Value Date   HDL 60.30 12/30/2014   Lab Results  Component Value Date   LDLCALC 175* 03/14/2013   Lab Results  Component Value Date   TRIG 288.0* 12/30/2014   Lab Results  Component Value Date   CHOLHDL 5 12/30/2014     Assessment & Plan  HTN (hypertension) Improved on recheck no changes today, minimize caffeine and sodium and recheck at next visit  Hyperlipidemia Recheck level today, has added Krill oil caps and tries to maintain a heart healthy diet and encouraged to start a statin  Abdominal pain Unclear etiology, urine culture negative. Encouraged probiotics and fiber supplements. Report worsening symptoms

## 2015-01-08 MED ORDER — ATORVASTATIN CALCIUM 10 MG PO TABS: 10.0000 mg | ORAL_TABLET | Freq: Every day | ORAL | Status: DC

## 2015-01-08 NOTE — Telephone Encounter (Signed)
Patient returned phone call. °

## 2015-01-08 NOTE — Telephone Encounter (Signed)
Called the patient informed of her results and she did agree to start atorvastatin.  Sent in to her local [facility] [address] [place]

## 2015-03-10 ENCOUNTER — Encounter: Payer: Self-pay | Admitting: Family

## 2015-03-10 ENCOUNTER — Ambulatory Visit (INDEPENDENT_AMBULATORY_CARE_PROVIDER_SITE_OTHER): Payer: Medicare Other | Admitting: Family

## 2015-03-10 VITALS — BP 120/72 | HR 96 | Temp 97.8°F | Resp 16 | Ht 68.0 in | Wt 176.0 lb

## 2015-03-10 DIAGNOSIS — R319 Hematuria, unspecified: Secondary | ICD-10-CM

## 2015-03-10 DIAGNOSIS — R350 Frequency of micturition: Secondary | ICD-10-CM

## 2015-03-10 DIAGNOSIS — N390 Urinary tract infection, site not specified: Secondary | ICD-10-CM

## 2015-03-10 LAB — POCT URINALYSIS DIPSTICK
Bilirubin, UA: NEGATIVE
Glucose, UA: NEGATIVE
KETONES UA: NEGATIVE
Nitrite, UA: NEGATIVE
PH UA: 6
Spec Grav, UA: 1.025
Urobilinogen, UA: NEGATIVE

## 2015-03-10 MED ORDER — CIPROFLOXACIN HCL 250 MG PO TABS: 250.0000 mg | ORAL_TABLET | Freq: Two times a day (BID) | ORAL | Status: DC

## 2015-03-10 NOTE — Progress Notes (Signed)
Subjective:    Patient ID: Jasmine Wilson, female    DOB: 1941-02-24, 74 y.o.   MRN: [id number]  HPI  Ms. Kastelic reports 6 day hx of dysuria. Using cranberry pills and apple cider vinegar without improvement. Has had some chills but did not check temp.  Denies associated pain in abdomen or in the lower back. She notes that she has had some foul smelling urine.  She denies gross hematuria. Last UTI was 4 years ago.   Review of Systems    see HPI  Past Medical History  Diagnosis Date  . Meniere's disease   . Hearing aid worn   . Chicken pox as a child  . Shingles   . Measles as a child  . Hyperlipidemia   . Female bladder prolapse, acquired 01/29/2011  . Preventative health care 01/29/2011  . HTN (hypertension) 01/29/2011  . Glaucoma 01/29/2011  . Abdominal pain 03/02/2011  . History of pneumonia   . Bronchitis 04/04/2011  . Cataract   . Arthritis 03/02/2012  . Cough 01/29/2013  . Vaginosis 01/29/2013    Social History   Social History  . Marital Status: Widowed    Spouse Name: N/A  . Number of Children: 3  . Years of Education: N/A   Occupational History  . Retired    Social History Main Topics  . Smoking status: Former Smoker    Types: Cigarettes    Quit date: 06/21/1970  . Smokeless tobacco: Never Used     Comment: occasional smoker  . Alcohol Use: No  . Drug Use: No  . Sexual Activity: No   Other Topics Concern  . Not on file   Social History Narrative    Past Surgical History  Procedure Laterality Date  . Lump removed on left breast      clogged milk duct, benign  . Tubal ligation      Family History  Problem Relation Age of Onset  . Breast cancer Mother   . Stroke Father   . Heart disease Father   . Hyperlipidemia Father   . Hypertension Father   . Breast cancer Sister 58  . Uterine cancer Maternal Grandmother     uterus    No Known Allergies  Current Outpatient Prescriptions on File Prior to Visit  Medication Sig Dispense Refill  .  atorvastatin (LIPITOR) 10 MG tablet Take 1 tablet (10 mg total) by mouth daily. 30 tablet 3  . cetirizine (ZYRTEC) 10 MG tablet Take 1 tablet (10 mg total) by mouth daily as needed for allergies or rhinitis (cough). 90 tablet 1  . dorzolamide (TRUSOPT) 2 % ophthalmic solution     . latanoprost (XALATAN) 0.005 % ophthalmic solution     . metoprolol succinate (TOPROL-XL) 50 MG 24 hr tablet Take 1 tablet (50 mg total) by mouth daily. Take with or immediately following a meal. 90 tablet 3   No current facility-administered medications on file prior to visit.    BP 120/72 mmHg  Pulse 96  Temp(Src) 97.8 F (36.6 C) (Oral)  Resp 16  Ht 5\' 8"  (1.727 m)  Wt 176 lb (79.833 kg)  BMI 26.77 kg/m2  SpO2 97%     Objective:   Physical Exam  Constitutional: She is oriented to person, place, and time. She appears well-developed and well-nourished. No distress.  Cardiovascular: Normal rate and regular rhythm.   No murmur heard. Pulmonary/Chest: Effort normal and breath sounds normal. No respiratory distress. She has no wheezes. She has no rales.  She exhibits no tenderness.  Abdominal:  Mild suprapubic tenderness to palpation  Genitourinary:  Neg CVAT bilaterally  Musculoskeletal: She exhibits no edema.  Neurological: She is alert and oriented to person, place, and time.  Psychiatric: She has a normal mood and affect. Her behavior is normal. Judgment and thought content normal.          Assessment & Plan:  UA- + leuks.  Small sample, will send available urine for culture. Rx with cipro. Pt advised to call if symptoms worsen or do not improve.

## 2015-03-10 NOTE — Patient Instructions (Signed)
Start Cipro for urinary tract infection. Call if symptoms worsen, if you develop fever or if symptoms are not improved in 3 days.

## 2015-03-10 NOTE — Progress Notes (Signed)
Pre visit review using our clinic review tool, if applicable. No additional management support is needed unless otherwise documented below in the visit note. 

## 2015-03-12 LAB — URINE CULTURE

## 2015-04-03 DIAGNOSIS — H903 Sensorineural hearing loss, bilateral: Secondary | ICD-10-CM | POA: Diagnosis not present

## 2015-05-06 ENCOUNTER — Ambulatory Visit (INDEPENDENT_AMBULATORY_CARE_PROVIDER_SITE_OTHER): Payer: Medicare Other | Admitting: Family Medicine

## 2015-05-06 ENCOUNTER — Encounter: Payer: Self-pay | Admitting: Family Medicine

## 2015-05-06 VITALS — BP 130/84 | HR 77 | Temp 98.1°F | Ht 68.0 in | Wt 175.0 lb

## 2015-05-06 DIAGNOSIS — Z1239 Encounter for other screening for malignant neoplasm of breast: Secondary | ICD-10-CM | POA: Diagnosis not present

## 2015-05-06 DIAGNOSIS — Z0000 Encounter for general adult medical examination without abnormal findings: Secondary | ICD-10-CM

## 2015-05-06 DIAGNOSIS — Z803 Family history of malignant neoplasm of breast: Secondary | ICD-10-CM | POA: Diagnosis not present

## 2015-05-06 DIAGNOSIS — E785 Hyperlipidemia, unspecified: Secondary | ICD-10-CM | POA: Diagnosis not present

## 2015-05-06 DIAGNOSIS — I10 Essential (primary) hypertension: Secondary | ICD-10-CM | POA: Diagnosis not present

## 2015-05-06 DIAGNOSIS — Z23 Encounter for immunization: Secondary | ICD-10-CM | POA: Diagnosis not present

## 2015-05-06 MED ORDER — ATORVASTATIN CALCIUM 10 MG PO TABS: 10.0000 mg | ORAL_TABLET | Freq: Every day | ORAL | Status: DC

## 2015-05-06 NOTE — Progress Notes (Signed)
Pre visit review using our clinic review tool, if applicable. No additional management support is needed unless otherwise documented below in the visit note. 

## 2015-05-06 NOTE — Assessment & Plan Note (Signed)
Tolerating statin, encouraged heart healthy diet, avoid trans fats, minimize simple carbs and saturated fats. Increase exercise as tolerated 

## 2015-05-06 NOTE — Assessment & Plan Note (Signed)
Well controlled, no changes to meds. Encouraged heart healthy diet such as the DASH diet and exercise as tolerated.  °

## 2015-05-06 NOTE — Patient Instructions (Signed)
Hypertension Hypertension, commonly called high blood pressure, is when the force of blood pumping through your arteries is too strong. Your arteries are the blood vessels that carry blood from your heart throughout your body. A blood pressure reading consists of a higher number over a lower number, such as 110/72. The higher number (systolic) is the pressure inside your arteries when your heart pumps. The lower number (diastolic) is the pressure inside your arteries when your heart relaxes. Ideally you want your blood pressure below 120/80. Hypertension forces your heart to work harder to pump blood. Your arteries may become narrow or stiff. Having untreated or uncontrolled hypertension can cause heart attack, stroke, kidney disease, and other problems. RISK FACTORS Some risk factors for high blood pressure are controllable. Others are not.  Risk factors you cannot control include:   Race. You may be at higher risk if you are African American.  Age. Risk increases with age.  Gender. Men are at higher risk than women before age 45 years. After age 65, women are at higher risk than men. Risk factors you can control include:  Not getting enough exercise or physical activity.  Being overweight.  Getting too much fat, sugar, calories, or salt in your diet.  Drinking too much alcohol. SIGNS AND SYMPTOMS Hypertension does not usually cause signs or symptoms. Extremely high blood pressure (hypertensive crisis) may cause headache, anxiety, shortness of breath, and nosebleed. DIAGNOSIS To check if you have hypertension, your health care provider will measure your blood pressure while you are seated, with your arm held at the level of your heart. It should be measured at least twice using the same arm. Certain conditions can cause a difference in blood pressure between your right and left arms. A blood pressure reading that is higher than normal on one occasion does not mean that you need treatment. If  it is not clear whether you have high blood pressure, you may be asked to return on a different day to have your blood pressure checked again. Or, you may be asked to monitor your blood pressure at home for 1 or more weeks. TREATMENT Treating high blood pressure includes making lifestyle changes and possibly taking medicine. Living a healthy lifestyle can help lower high blood pressure. You may need to change some of your habits. Lifestyle changes may include:  Following the DASH diet. This diet is high in fruits, vegetables, and whole grains. It is low in salt, red meat, and added sugars.  Keep your sodium intake below 2,300 mg per day.  Getting at least 30-45 minutes of aerobic exercise at least 4 times per week.  Losing weight if necessary.  Not smoking.  Limiting alcoholic beverages.  Learning ways to reduce stress. Your health care provider may prescribe medicine if lifestyle changes are not enough to get your blood pressure under control, and if one of the following is true:  You are 18-59 years of age and your systolic blood pressure is above 140.  You are 60 years of age or older, and your systolic blood pressure is above 150.  Your diastolic blood pressure is above 90.  You have diabetes, and your systolic blood pressure is over 140 or your diastolic blood pressure is over 90.  You have kidney disease and your blood pressure is above 140/90.  You have heart disease and your blood pressure is above 140/90. Your personal target blood pressure may vary depending on your medical conditions, your age, and other factors. HOME CARE INSTRUCTIONS    Have your blood pressure rechecked as directed by your health care provider.   Take medicines only as directed by your health care provider. Follow the directions carefully. Blood pressure medicines must be taken as prescribed. The medicine does not work as well when you skip doses. Skipping doses also puts you at risk for  problems.  Do not smoke.   Monitor your blood pressure at home as directed by your health care provider. SEEK MEDICAL CARE IF:   You think you are having a reaction to medicines taken.  You have recurrent headaches or feel dizzy.  You have swelling in your ankles.  You have trouble with your vision. SEEK IMMEDIATE MEDICAL CARE IF:  You develop a severe headache or confusion.  You have unusual weakness, numbness, or feel faint.  You have severe chest or abdominal pain.  You vomit repeatedly.  You have trouble breathing. MAKE SURE YOU:   Understand these instructions.  Will watch your condition.  Will get help right away if you are not doing well or get worse.   This information is not intended to replace advice given to you by your health care provider. Make sure you discuss any questions you have with your health care provider.   Document Released: 06/07/2005 Document Revised: 10/22/2014 Document Reviewed: 03/30/2013 Elsevier Interactive Patient Education 2016 Elsevier Inc.  

## 2015-05-07 LAB — COMPREHENSIVE METABOLIC PANEL
ALBUMIN: 4.3 g/dL (ref 3.5–5.2)
ALK PHOS: 80 U/L (ref 39–117)
ALT: 16 U/L (ref 0–35)
AST: 20 U/L (ref 0–37)
BUN: 13 mg/dL (ref 6–23)
CHLORIDE: 103 meq/L (ref 96–112)
CO2: 27 mEq/L (ref 19–32)
CREATININE: 0.84 mg/dL (ref 0.40–1.20)
Calcium: 9.8 mg/dL (ref 8.4–10.5)
GFR: 70.4 mL/min (ref >60.00)
GLUCOSE: 79 mg/dL (ref 70–99)
POTASSIUM: 4.1 meq/L (ref 3.5–5.1)
SODIUM: 139 meq/L (ref 135–145)
TOTAL PROTEIN: 6.8 g/dL (ref 6.0–8.3)
Total Bilirubin: 0.5 mg/dL (ref 0.2–1.2)

## 2015-05-07 LAB — CBC
HCT: 43.9 % (ref 36.0–46.0)
Hemoglobin: 14.5 g/dL (ref 12.0–15.0)
MCHC: 33.1 g/dL (ref 30.0–36.0)
MCV: 89.7 fl (ref 78.0–100.0)
Platelets: 288 10*3/uL (ref 150.0–400.0)
RBC: 4.89 Mil/uL (ref 3.87–5.11)
RDW: 14 % (ref 11.5–15.5)
WBC: 8.4 10*3/uL (ref 4.0–10.5)

## 2015-05-07 LAB — TSH: TSH: 1.58 u[IU]/mL (ref 0.35–4.50)

## 2015-05-07 LAB — LIPID PANEL
CHOLESTEROL: 179 mg/dL (ref 0–200)
HDL: 63 mg/dL (ref >39.00)
LDL CALC: 82 mg/dL (ref 0–99)
NONHDL: 116.09
Total CHOL/HDL Ratio: 3
Triglycerides: 172 mg/dL — ABNORMAL HIGH (ref 0.0–149.0)
VLDL: 34.4 mg/dL (ref 0.0–40.0)

## 2015-05-11 ENCOUNTER — Other Ambulatory Visit: Payer: Self-pay | Admitting: Family Medicine

## 2015-05-16 ENCOUNTER — Encounter: Payer: Self-pay | Admitting: Family Medicine

## 2015-05-16 NOTE — Assessment & Plan Note (Addendum)
MGM ordered. Pneumonia shot is given.

## 2015-05-16 NOTE — Progress Notes (Signed)
Subjective:    Patient ID: Jasmine Wilson, female    DOB: [birth year], 74 y.o.   MRN: [id number]  Chief Complaint  Patient presents with  . Follow-up    HPI Patient is in today for follow up. No recent illness or acute concerns. She is trying to maintain a heart healthy diet and stay active. Denies CP/palp/SOB/HA/congestion/fevers/GI or GU c/o. Taking meds as prescribed  Past Medical History  Diagnosis Date  . Meniere's disease   . Hearing aid worn   . Chicken pox as a child  . Shingles   . Measles as a child  . Hyperlipidemia   . Female bladder prolapse, acquired 01/29/2011  . Preventative health care 01/29/2011  . HTN (hypertension) 01/29/2011  . Glaucoma 01/29/2011  . Abdominal pain 03/02/2011  . History of pneumonia   . Bronchitis 04/04/2011  . Cataract   . Arthritis 03/02/2012  . Cough 01/29/2013  . Vaginosis 01/29/2013    Past Surgical History  Procedure Laterality Date  . Lump removed on left breast      clogged milk duct, benign  . Tubal ligation      Family History  Problem Relation Age of Onset  . Breast cancer Mother   . Stroke Father   . Heart disease Father   . Hyperlipidemia Father   . Hypertension Father   . Breast cancer Sister 75  . Uterine cancer Maternal Grandmother     uterus    Social History   Social History  . Marital Status: Widowed    Spouse Name: N/A  . Number of Children: 3  . Years of Education: N/A   Occupational History  . Retired    Social History Main Topics  . Smoking status: Former Smoker    Types: Cigarettes    Quit date: 06/21/1970  . Smokeless tobacco: Never Used     Comment: occasional smoker  . Alcohol Use: No  . Drug Use: No  . Sexual Activity: No   Other Topics Concern  . Not on file   Social History Narrative    Outpatient Prescriptions Prior to Visit  Medication Sig Dispense Refill  . cetirizine (ZYRTEC) 10 MG tablet Take 1 tablet (10 mg total) by mouth daily as needed for allergies or rhinitis (cough).  90 tablet 1  . dorzolamide (TRUSOPT) 2 % ophthalmic solution Place 1 drop into both eyes 2 (two) times daily.     Marland Kitchen latanoprost (XALATAN) 0.005 % ophthalmic solution     . metoprolol succinate (TOPROL-XL) 50 MG 24 hr tablet Take 1 tablet (50 mg total) by mouth daily. Take with or immediately following a meal. 90 tablet 3  . atorvastatin (LIPITOR) 10 MG tablet Take 1 tablet (10 mg total) by mouth daily. 30 tablet 3  . ciprofloxacin (CIPRO) 250 MG tablet Take 1 tablet (250 mg total) by mouth 2 (two) times daily. 6 tablet 0   No facility-administered medications prior to visit.    Allergies  Allergen Reactions  . Codeine Other (See Comments)    Makes feel funny    Review of Systems  Constitutional: Negative for fever and malaise/fatigue.  HENT: Negative for congestion.   Eyes: Negative for discharge.  Respiratory: Negative for shortness of breath.   Cardiovascular: Negative for chest pain, palpitations and leg swelling.  Gastrointestinal: Negative for nausea and abdominal pain.  Genitourinary: Negative for dysuria.  Musculoskeletal: Negative for falls.  Skin: Negative for rash.  Neurological: Negative for loss of consciousness and headaches.  Endo/Heme/Allergies:  Negative for environmental allergies.  Psychiatric/Behavioral: Negative for depression. The patient is not nervous/anxious.        Objective:    Physical Exam  Constitutional: She is oriented to person, place, and time. She appears well-developed and well-nourished. No distress.  HENT:  Head: Normocephalic and atraumatic.  Nose: Nose normal.  Eyes: Right eye exhibits no discharge. Left eye exhibits no discharge.  Neck: Normal range of motion. Neck supple.  Cardiovascular: Normal rate and regular rhythm.   No murmur heard. Pulmonary/Chest: Effort normal and breath sounds normal.  Abdominal: Soft. Bowel sounds are normal. There is no tenderness.  Musculoskeletal: She exhibits no edema.  Neurological: She is alert and  oriented to person, place, and time.  Skin: Skin is warm and dry.  Psychiatric: She has a normal mood and affect.  Nursing note and vitals reviewed.   BP 130/84 mmHg  Pulse 77  Temp(Src) 98.1 F (36.7 C) (Oral)  Ht 5\' 8"  (1.727 m)  Wt 175 lb (79.379 kg)  BMI 26.61 kg/m2  SpO2 94% Wt Readings from Last 3 Encounters:  05/06/15 175 lb (79.379 kg)  03/10/15 176 lb (79.833 kg)  12/30/14 173 lb 6 oz (78.642 kg)     Lab Results  Component Value Date   WBC 8.4 05/06/2015   HGB 14.5 05/06/2015   HCT 43.9 05/06/2015   PLT 288.0 05/06/2015   GLUCOSE 79 05/06/2015   CHOL 179 05/06/2015   TRIG 172.0* 05/06/2015   HDL 63.00 05/06/2015   LDLDIRECT 165.0 12/30/2014   LDLCALC 82 05/06/2015   ALT 16 05/06/2015   AST 20 05/06/2015   NA 139 05/06/2015   K 4.1 05/06/2015   CL 103 05/06/2015   CREATININE 0.84 05/06/2015   BUN 13 05/06/2015   CO2 27 05/06/2015   TSH 1.58 05/06/2015    Lab Results  Component Value Date   TSH 1.58 05/06/2015   Lab Results  Component Value Date   WBC 8.4 05/06/2015   HGB 14.5 05/06/2015   HCT 43.9 05/06/2015   MCV 89.7 05/06/2015   PLT 288.0 05/06/2015   Lab Results  Component Value Date   NA 139 05/06/2015   K 4.1 05/06/2015   CO2 27 05/06/2015   GLUCOSE 79 05/06/2015   BUN 13 05/06/2015   CREATININE 0.84 05/06/2015   BILITOT 0.5 05/06/2015   ALKPHOS 80 05/06/2015   AST 20 05/06/2015   ALT 16 05/06/2015   PROT 6.8 05/06/2015   ALBUMIN 4.3 05/06/2015   CALCIUM 9.8 05/06/2015   GFR 70.40 05/06/2015   Lab Results  Component Value Date   CHOL 179 05/06/2015   Lab Results  Component Value Date   HDL 63.00 05/06/2015   Lab Results  Component Value Date   LDLCALC 82 05/06/2015   Lab Results  Component Value Date   TRIG 172.0* 05/06/2015   Lab Results  Component Value Date   CHOLHDL 3 05/06/2015   No results found for: HGBA1C     Assessment & Plan:   Problem List Items Addressed This Visit    HTN (hypertension) -  Primary    Well controlled, no changes to meds. Encouraged heart healthy diet such as the DASH diet and exercise as tolerated.       Relevant Medications   atorvastatin (LIPITOR) 10 MG tablet   Other Relevant Orders   TSH (Completed)   CBC (Completed)   Comprehensive metabolic panel (Completed)   Lipid panel (Completed)   MM Digital Screening   Hyperlipidemia  Tolerating statin, encouraged heart healthy diet, avoid trans fats, minimize simple carbs and saturated fats. Increase exercise as tolerated      Relevant Medications   atorvastatin (LIPITOR) 10 MG tablet   Other Relevant Orders   TSH (Completed)   CBC (Completed)   Comprehensive metabolic panel (Completed)   Lipid panel (Completed)   MM Digital Screening   Preventative health care    MGM ordered. Pneumonia shot is given.        Other Visit Diagnoses    FH: breast cancer        Relevant Orders    TSH (Completed)    CBC (Completed)    Comprehensive metabolic panel (Completed)    Lipid panel (Completed)    MM Digital Screening    Breast cancer screening        Relevant Orders    TSH (Completed)    CBC (Completed)    Comprehensive metabolic panel (Completed)    Lipid panel (Completed)    MM Digital Screening    Need for vaccination with 13-polyvalent pneumococcal conjugate vaccine        Relevant Orders    Pneumococcal conjugate vaccine 13-valent (Completed)       I have discontinued Ms. Topete's ciprofloxacin. I am also having her maintain her latanoprost, dorzolamide, cetirizine, metoprolol succinate, timolol, and atorvastatin.  Meds ordered this encounter  Medications  . timolol (BETIMOL) 0.5 % ophthalmic solution    Sig: Place 1 drop into both eyes 2 (two) times daily.  Marland Kitchen atorvastatin (LIPITOR) 10 MG tablet    Sig: Take 1 tablet (10 mg total) by mouth daily.    Dispense:  90 tablet    Refill:  2     Penni Homans, MD

## 2015-05-19 ENCOUNTER — Ambulatory Visit (HOSPITAL_BASED_OUTPATIENT_CLINIC_OR_DEPARTMENT_OTHER)
Admission: RE | Admit: 2015-05-19 | Discharge: 2015-05-19 | Disposition: A | Discharge status: Home / Self Care | Payer: Medicare Other | Source: Ambulatory Visit | Attending: Family Medicine | Admitting: Family Medicine

## 2015-05-19 DIAGNOSIS — Z1231 Encounter for screening mammogram for malignant neoplasm of breast: Secondary | ICD-10-CM | POA: Insufficient documentation

## 2015-05-19 DIAGNOSIS — Z803 Family history of malignant neoplasm of breast: Secondary | ICD-10-CM

## 2015-05-19 DIAGNOSIS — Z1239 Encounter for other screening for malignant neoplasm of breast: Secondary | ICD-10-CM

## 2015-05-19 DIAGNOSIS — E785 Hyperlipidemia, unspecified: Secondary | ICD-10-CM

## 2015-05-19 DIAGNOSIS — I10 Essential (primary) hypertension: Secondary | ICD-10-CM

## 2015-06-12 DIAGNOSIS — H401131 Primary open-angle glaucoma, bilateral, mild stage: Secondary | ICD-10-CM | POA: Diagnosis not present

## 2015-07-31 ENCOUNTER — Encounter: Payer: Self-pay | Admitting: Family Medicine

## 2015-07-31 ENCOUNTER — Ambulatory Visit (INDEPENDENT_AMBULATORY_CARE_PROVIDER_SITE_OTHER): Payer: Medicare Other | Admitting: Family Medicine

## 2015-07-31 ENCOUNTER — Telehealth: Payer: Self-pay | Admitting: Family Medicine

## 2015-07-31 VITALS — BP 128/72 | HR 94 | Temp 98.8°F | Ht 68.0 in | Wt 176.1 lb

## 2015-07-31 DIAGNOSIS — J209 Acute bronchitis, unspecified: Secondary | ICD-10-CM | POA: Diagnosis not present

## 2015-07-31 DIAGNOSIS — I10 Essential (primary) hypertension: Secondary | ICD-10-CM

## 2015-07-31 MED ORDER — HYDROCOD POLST-CPM POLST ER 10-8 MG/5ML PO SUER: 5.0000 mL | Freq: Every evening | ORAL | Status: DC | PRN

## 2015-07-31 MED ORDER — CEFDINIR 300 MG PO CAPS: 300.0000 mg | ORAL_CAPSULE | Freq: Two times a day (BID) | ORAL | Status: AC

## 2015-07-31 NOTE — Telephone Encounter (Signed)
[id number] - [place], [place] [phone] (Phone) [phone] (Fax)         Reason for call:  Pharmacy called and stated patient does not want cefdinir (OMNICEF) 300 MG capsule and only wants chlorpheniramine-HYDROcodone (TUSSIONEX PENNKINETIC ER) 10-8 MG/5ML SURE. As per pharmacy they dont normally give one with with out the other in need of clinical advice. Please follow up with pharmacy directly.

## 2015-07-31 NOTE — Patient Instructions (Signed)
Encouraged increased rest and hydration, add probiotics, zinc such as Coldeze or Xicam. Treat fevers as needed. Mucinex 2 x daily. Vitamin C 500 mg daily, elderberry daily Increase fluids to 64 oz daily   Acute Bronchitis Bronchitis is inflammation of the airways that extend from the windpipe into the lungs (bronchi). The inflammation often causes mucus to develop. This leads to a cough, which is the most common symptom of bronchitis.  In acute bronchitis, the condition usually develops suddenly and goes away over time, usually in a couple weeks. Smoking, allergies, and asthma can make bronchitis worse. Repeated episodes of bronchitis may cause further lung problems.  CAUSES Acute bronchitis is most often caused by the same virus that causes a cold. The virus can spread from person to person (contagious) through coughing, sneezing, and touching contaminated objects. SIGNS AND SYMPTOMS   Cough.   Fever.   Coughing up mucus.   Body aches.   Chest congestion.   Chills.   Shortness of breath.   Sore throat.  DIAGNOSIS  Acute bronchitis is usually diagnosed through a physical exam. Your health care provider will also ask you questions about your medical history. Tests, such as chest X-rays, are sometimes done to rule out other conditions.  TREATMENT  Acute bronchitis usually goes away in a couple weeks. Oftentimes, no medical treatment is necessary. Medicines are sometimes given for relief of fever or cough. Antibiotic medicines are usually not needed but may be prescribed in certain situations. In some cases, an inhaler may be recommended to help reduce shortness of breath and control the cough. A cool mist vaporizer may also be used to help thin bronchial secretions and make it easier to clear the chest.  HOME CARE INSTRUCTIONS  Get plenty of rest.   Drink enough fluids to keep your urine clear or pale yellow (unless you have a medical condition that requires fluid  restriction). Increasing fluids may help thin your respiratory secretions (sputum) and reduce chest congestion, and it will prevent dehydration.   Take medicines only as directed by your health care provider.  If you were prescribed an antibiotic medicine, finish it all even if you start to feel better.  Avoid smoking and secondhand smoke. Exposure to cigarette smoke or irritating chemicals will make bronchitis worse. If you are a smoker, consider using nicotine gum or skin patches to help control withdrawal symptoms. Quitting smoking will help your lungs heal faster.   Reduce the chances of another bout of acute bronchitis by washing your hands frequently, avoiding people with cold symptoms, and trying not to touch your hands to your mouth, nose, or eyes.   Keep all follow-up visits as directed by your health care provider.  SEEK MEDICAL CARE IF: Your symptoms do not improve after 1 week of treatment.  SEEK IMMEDIATE MEDICAL CARE IF:  You develop an increased fever or chills.   You have chest pain.   You have severe shortness of breath.  You have bloody sputum.   You develop dehydration.  You faint or repeatedly feel like you are going to pass out.  You develop repeated vomiting.  You develop a severe headache. MAKE SURE YOU:   Understand these instructions.  Will watch your condition.  Will get help right away if you are not doing well or get worse.   This information is not intended to replace advice given to you by your health care provider. Make sure you discuss any questions you have with your health care provider.  Document Released: 07/15/2004 Document Revised: 06/28/2014 Document Reviewed: 11/28/2012 Elsevier Interactive Patient Education [employer].

## 2015-07-31 NOTE — Progress Notes (Signed)
Pre visit review using our clinic review tool, if applicable. No additional management support is needed unless otherwise documented below in the visit note. 

## 2015-07-31 NOTE — Progress Notes (Signed)
AJSA GODIN [id number] 01/28/1941 07/31/2015      Patient Progress Note   Subjective  Chief Complaint  Chief Complaint  Patient presents with  . Sore Throat  . Cough  . Ear Pain    HPI  Patient presents with three day history of bilateral ear pain, sore throat, and cough. Cough is dry. Rhinorrhea, clear mucous. Started taking OTC decongestant Tuesday which has not helped. Endorses headache that she has used ice packs for in addition to rib pain from coughing. Denies fever, n/v/d, myalgias. Spent the weekend with friends who had similar symptoms. They had been experiencing the symptoms for over a week. Has been taking vitamin C. Has not been able to eat much or drink much. Felt lightheaded this morning during her shower and had to get out and lay down. Has not had flu shot.  Patient denies shortness of breath, chest pain,changes in urination, GI issues, recent fevers or illnesses    Past Medical History  Diagnosis Date  . Meniere's disease   . Hearing aid worn   . Chicken pox as a child  . Shingles   . Measles as a child  . Hyperlipidemia   . Female bladder prolapse, acquired 01/29/2011  . Preventative health care 01/29/2011  . HTN (hypertension) 01/29/2011  . Glaucoma 01/29/2011  . Abdominal pain 03/02/2011  . History of pneumonia   . Bronchitis 04/04/2011  . Cataract   . Arthritis 03/02/2012  . Cough 01/29/2013  . Vaginosis 01/29/2013    Past Surgical History  Procedure Laterality Date  . Lump removed on left breast      clogged milk duct, benign  . Tubal ligation      Family History  Problem Relation Age of Onset  . Breast cancer Mother   . Stroke Father   . Heart disease Father   . Hyperlipidemia Father   . Hypertension Father   . Breast cancer Sister 19  . Uterine cancer Maternal Grandmother     uterus    Social History   Social History  . Marital Status: Widowed    Spouse Name: N/A  . Number of Children: 3  . Years of Education: N/A   Occupational  History  . Retired    Social History Main Topics  . Smoking status: Former Smoker    Types: Cigarettes    Quit date: 06/21/1970  . Smokeless tobacco: Never Used     Comment: occasional smoker  . Alcohol Use: No  . Drug Use: No  . Sexual Activity: No   Other Topics Concern  . Not on file   Social History Narrative    Current Outpatient Prescriptions on File Prior to Visit  Medication Sig Dispense Refill  . atorvastatin (LIPITOR) 10 MG tablet Take 1 tablet (10 mg total) by mouth daily. 90 tablet 2  . atorvastatin (LIPITOR) 10 MG tablet TAKE ONE TABLET BY MOUTH DAILY. 30 tablet 3  . cetirizine (ZYRTEC) 10 MG tablet Take 1 tablet (10 mg total) by mouth daily as needed for allergies or rhinitis (cough). 90 tablet 1  . dorzolamide (TRUSOPT) 2 % ophthalmic solution Place 1 drop into both eyes 2 (two) times daily.     Marland Kitchen latanoprost (XALATAN) 0.005 % ophthalmic solution     . metoprolol succinate (TOPROL-XL) 50 MG 24 hr tablet Take 1 tablet (50 mg total) by mouth daily. Take with or immediately following a meal. 90 tablet 3  . timolol (BETIMOL) 0.5 % ophthalmic solution Place 1 drop into  both eyes 2 (two) times daily.     No current facility-administered medications on file prior to visit.    Allergies  Allergen Reactions  . Codeine Other (See Comments)    Makes feel funny    Review of Systems   Constitutional: Negative for fever. Positive for fatigue HENT: Positive for congestion, ear pain, rhinorrhea, sore throat  Eyes: Negative for discharge.  Respiratory: Negative for shortness of breath.  Cardiovascular: Negative for chest pain, palpitations and leg swelling.  Gastrointestinal: Negative for nausea, abdominal pain and diarrhea.  Genitourinary: Negative for dysuria and urgency, hematuria and flank pain.  Musculoskeletal: Negative for myalgias and falls.  Skin: Negative for rash.  Neurological: Negative for loss of consciousness and headaches.  Endo/Heme/Allergies:  Negative for polydipsia.  Psychiatric/Behavioral: Negative for depression and suicidal ideas. The patient is not nervous/anxious and does not have insomnia.    Objective  BP 128/72 mmHg  Pulse 105  Temp(Src) 98.8 F (37.1 C) (Oral)  Ht 5\' 8"  (1.727 m)  Wt 176 lb 2 oz (79.89 kg)  BMI 26.79 kg/m2  SpO2 94%  Physical Exam   Constitutional: Oriented to person, place, and time. Appears well-nourished. No distress.  Eyes: EOM are normal. Pupils are equal, round, and reactive to light.  Ears: Bilateral redness and bulging of tympanic membrane Cardiovascular: Normal rate and regular rhythm.   Pulmonary/Chest: Breath sounds normal.  Abdominal: Soft. Bowel sounds are normal.  Lymphadenopathy:   No cervical adenopathy.  Neurological: Alert and oriented to person, place, and time. Normal reflexes. No cranial nerve deficit.     Assessment & Plan  Most likely viral in nature -Tucinex  -Increase fluids -Zinc, Vitamin C, elderberry  Patient seen with and examined with student.  Agree with documentation See separate note for further documentation

## 2015-07-31 NOTE — Telephone Encounter (Signed)
Called the pharmacy informed pharmacist of PCP instructions regarding medication.

## 2015-07-31 NOTE — Telephone Encounter (Signed)
She needs the Tussionex first to help her rest. Currently I believe she has a virus, if the illness persists, worsens or she spikes a fever then she is supposed to start the Cefdinir

## 2015-08-10 ENCOUNTER — Encounter: Payer: Self-pay | Admitting: Family Medicine

## 2015-08-10 DIAGNOSIS — J209 Acute bronchitis, unspecified: Secondary | ICD-10-CM | POA: Insufficient documentation

## 2015-08-10 NOTE — Assessment & Plan Note (Addendum)
Encouraged increased rest and hydration, add probiotics, zinc such as Coldeze or Xicam. Treat fevers as needed. Tussionex prn, likely viral consider antibiotics if symptoms worsen or persist

## 2015-08-10 NOTE — Progress Notes (Signed)
Patient ID: Jasmine Wilson, female   DOB: 01-14-1941, 75 y.o.   MRN: [id number]   Subjective:    Patient ID: Jasmine Wilson, female    DOB: [birth year], 75 y.o.   MRN: [id number]  Chief Complaint  Patient presents with  . Sore Throat  . Cough  . Ear Pain    HPI Patient is in today with three day history of bilateral ear pain, sore throat, and cough. Cough is dry. Rhinorrhea, clear mucous. Started taking OTC decongestant Tuesday which has not helped. Endorses headache that she has used ice packs for in addition to rib pain from coughing. Denies fever, n/v/d, myalgias. Spent the weekend with friends who had similar symptoms. They had been experiencing the symptoms for over a week. Has been taking vitamin C. Has not been able to eat much or drink much. Felt lightheaded this morning during her shower and had to get out and lay down. Has not had flu shot.  Past Medical History  Diagnosis Date  . Meniere's disease   . Hearing aid worn   . Chicken pox as a child  . Shingles   . Measles as a child  . Hyperlipidemia   . Female bladder prolapse, acquired 01/29/2011  . Preventative health care 01/29/2011  . HTN (hypertension) 01/29/2011  . Glaucoma 01/29/2011  . Abdominal pain 03/02/2011  . History of pneumonia   . Bronchitis 04/04/2011  . Cataract   . Arthritis 03/02/2012  . Cough 01/29/2013  . Vaginosis 01/29/2013    Past Surgical History  Procedure Laterality Date  . Lump removed on left breast      clogged milk duct, benign  . Tubal ligation      Family History  Problem Relation Age of Onset  . Breast cancer Mother   . Stroke Father   . Heart disease Father   . Hyperlipidemia Father   . Hypertension Father   . Breast cancer Sister 39  . Uterine cancer Maternal Grandmother     uterus    Social History   Social History  . Marital Status: Widowed    Spouse Name: N/A  . Number of Children: 3  . Years of Education: N/A   Occupational History  . Retired    Social History Main  Topics  . Smoking status: Former Smoker    Types: Cigarettes    Quit date: 06/21/1970  . Smokeless tobacco: Never Used     Comment: occasional smoker  . Alcohol Use: No  . Drug Use: No  . Sexual Activity: No   Other Topics Concern  . Not on file   Social History Narrative    Outpatient Prescriptions Prior to Visit  Medication Sig Dispense Refill  . atorvastatin (LIPITOR) 10 MG tablet Take 1 tablet (10 mg total) by mouth daily. 90 tablet 2  . atorvastatin (LIPITOR) 10 MG tablet TAKE ONE TABLET BY MOUTH DAILY. 30 tablet 3  . cetirizine (ZYRTEC) 10 MG tablet Take 1 tablet (10 mg total) by mouth daily as needed for allergies or rhinitis (cough). 90 tablet 1  . dorzolamide (TRUSOPT) 2 % ophthalmic solution Place 1 drop into both eyes 2 (two) times daily.     Marland Kitchen latanoprost (XALATAN) 0.005 % ophthalmic solution     . metoprolol succinate (TOPROL-XL) 50 MG 24 hr tablet Take 1 tablet (50 mg total) by mouth daily. Take with or immediately following a meal. 90 tablet 3  . timolol (BETIMOL) 0.5 % ophthalmic solution Place 1 drop into both  eyes 2 (two) times daily.     No facility-administered medications prior to visit.    Allergies  Allergen Reactions  . Codeine Other (See Comments)    Makes feel funny    Review of Systems  Constitutional: Positive for malaise/fatigue. Negative for fever.  HENT: Positive for congestion and sore throat.   Eyes: Negative for discharge.  Respiratory: Positive for cough and sputum production. Negative for shortness of breath.   Cardiovascular: Negative for chest pain, palpitations and leg swelling.  Gastrointestinal: Negative for nausea and abdominal pain.  Genitourinary: Negative for dysuria.  Musculoskeletal: Positive for myalgias. Negative for falls.  Skin: Negative for rash.  Neurological: Negative for loss of consciousness and headaches.  Endo/Heme/Allergies: Negative for environmental allergies.  Psychiatric/Behavioral: Negative for depression.  The patient is not nervous/anxious.        Objective:    Physical Exam  Constitutional: She is oriented to person, place, and time. She appears well-developed and well-nourished. No distress.  HENT:  Head: Normocephalic and atraumatic.  Right Ear: External ear normal.  Left Ear: External ear normal.  Nose: Nose normal.  Mouth/Throat: Oropharynx is clear and moist.  TMs dull and retracted, nasal mucosa boggy and erythematous  Eyes: Conjunctivae and EOM are normal. Pupils are equal, round, and reactive to light. Right eye exhibits no discharge. Left eye exhibits no discharge.  Neck: Normal range of motion. Neck supple. No JVD present. No thyromegaly present.  Cardiovascular: Normal rate, regular rhythm, normal heart sounds and intact distal pulses.   Pulmonary/Chest: Effort normal and breath sounds normal. No respiratory distress. She has no wheezes. She has no rales.  Abdominal: Soft. Bowel sounds are normal. There is no tenderness. There is no rebound and no guarding.  Genitourinary: Vagina normal and uterus normal. Guaiac negative stool. No vaginal discharge found.  Musculoskeletal: Normal range of motion. She exhibits no edema or tenderness.  Lymphadenopathy:    She has no cervical adenopathy.  Neurological: She is alert and oriented to person, place, and time. She has normal reflexes. No cranial nerve deficit.  Skin: Skin is warm and dry. No rash noted. She is not diaphoretic. No erythema.  Psychiatric: She has a normal mood and affect. Her behavior is normal. Judgment and thought content normal.  Nursing note and vitals reviewed.   BP 128/72 mmHg  Pulse 94  Temp(Src) 98.8 F (37.1 C) (Oral)  Ht 5\' 8"  (1.727 m)  Wt 176 lb 2 oz (79.89 kg)  BMI 26.79 kg/m2  SpO2 94% Wt Readings from Last 3 Encounters:  07/31/15 176 lb 2 oz (79.89 kg)  05/06/15 175 lb (79.379 kg)  03/10/15 176 lb (79.833 kg)     Lab Results  Component Value Date   WBC 8.4 05/06/2015   HGB 14.5  05/06/2015   HCT 43.9 05/06/2015   PLT 288.0 05/06/2015   GLUCOSE 79 05/06/2015   CHOL 179 05/06/2015   TRIG 172.0* 05/06/2015   HDL 63.00 05/06/2015   LDLDIRECT 165.0 12/30/2014   LDLCALC 82 05/06/2015   ALT 16 05/06/2015   AST 20 05/06/2015   NA 139 05/06/2015   K 4.1 05/06/2015   CL 103 05/06/2015   CREATININE 0.84 05/06/2015   BUN 13 05/06/2015   CO2 27 05/06/2015   TSH 1.58 05/06/2015    Lab Results  Component Value Date   TSH 1.58 05/06/2015   Lab Results  Component Value Date   WBC 8.4 05/06/2015   HGB 14.5 05/06/2015   HCT 43.9 05/06/2015  MCV 89.7 05/06/2015   PLT 288.0 05/06/2015   Lab Results  Component Value Date   NA 139 05/06/2015   K 4.1 05/06/2015   CO2 27 05/06/2015   GLUCOSE 79 05/06/2015   BUN 13 05/06/2015   CREATININE 0.84 05/06/2015   BILITOT 0.5 05/06/2015   ALKPHOS 80 05/06/2015   AST 20 05/06/2015   ALT 16 05/06/2015   PROT 6.8 05/06/2015   ALBUMIN 4.3 05/06/2015   CALCIUM 9.8 05/06/2015   GFR 70.40 05/06/2015   Lab Results  Component Value Date   CHOL 179 05/06/2015   Lab Results  Component Value Date   HDL 63.00 05/06/2015   Lab Results  Component Value Date   LDLCALC 82 05/06/2015   Lab Results  Component Value Date   TRIG 172.0* 05/06/2015   Lab Results  Component Value Date   CHOLHDL 3 05/06/2015   No results found for: HGBA1C     Assessment & Plan:   Problem List Items Addressed This Visit    Acute bronchitis - Primary    Encouraged increased rest and hydration, add probiotics, zinc such as Coldeze or Xicam. Treat fevers as needed. Tussionex prn, likely viral consider antibiotics if symptoms worsen or persist      Relevant Medications   chlorpheniramine-HYDROcodone (TUSSIONEX PENNKINETIC ER) 10-8 MG/5ML SUER   cefdinir (OMNICEF) 300 MG capsule   HTN (hypertension)    Well controlled, no changes to meds. Encouraged heart healthy diet such as the DASH diet and exercise as tolerated.          I am  having Ms. Corniel start on chlorpheniramine-HYDROcodone and cefdinir. I am also having her maintain her latanoprost, dorzolamide, cetirizine, metoprolol succinate, timolol, atorvastatin, and atorvastatin.  Meds ordered this encounter  Medications  . chlorpheniramine-HYDROcodone (TUSSIONEX PENNKINETIC ER) 10-8 MG/5ML SUER    Sig: Take 5 mLs by mouth at bedtime as needed for cough.    Dispense:  240 mL    Refill:  0  . cefdinir (OMNICEF) 300 MG capsule    Sig: Take 1 capsule (300 mg total) by mouth 2 (two) times daily.    Dispense:  20 capsule    Refill:  0     Penni Homans, MD

## 2015-08-10 NOTE — Assessment & Plan Note (Signed)
Well controlled, no changes to meds. Encouraged heart healthy diet such as the DASH diet and exercise as tolerated.  °

## 2015-09-25 ENCOUNTER — Other Ambulatory Visit: Payer: Self-pay | Admitting: Family Medicine

## 2015-10-08 DIAGNOSIS — H524 Presbyopia: Secondary | ICD-10-CM | POA: Diagnosis not present

## 2015-10-22 DIAGNOSIS — L821 Other seborrheic keratosis: Secondary | ICD-10-CM | POA: Diagnosis not present

## 2015-10-22 DIAGNOSIS — D485 Neoplasm of uncertain behavior of skin: Secondary | ICD-10-CM | POA: Diagnosis not present

## 2015-10-22 DIAGNOSIS — D1801 Hemangioma of skin and subcutaneous tissue: Secondary | ICD-10-CM | POA: Diagnosis not present

## 2015-10-22 DIAGNOSIS — L814 Other melanin hyperpigmentation: Secondary | ICD-10-CM | POA: Diagnosis not present

## 2015-10-22 DIAGNOSIS — L570 Actinic keratosis: Secondary | ICD-10-CM | POA: Diagnosis not present

## 2015-10-22 DIAGNOSIS — L820 Inflamed seborrheic keratosis: Secondary | ICD-10-CM | POA: Diagnosis not present

## 2015-11-06 ENCOUNTER — Ambulatory Visit: Payer: Medicare Other

## 2015-11-13 ENCOUNTER — Encounter: Payer: Medicare Other | Admitting: Family Medicine

## 2015-12-22 ENCOUNTER — Other Ambulatory Visit: Payer: Self-pay | Admitting: Family Medicine

## 2016-02-24 ENCOUNTER — Encounter: Payer: Self-pay | Admitting: Family Medicine

## 2016-02-24 NOTE — Telephone Encounter (Signed)
error:[id number] ° °

## 2016-02-25 DIAGNOSIS — H401131 Primary open-angle glaucoma, bilateral, mild stage: Secondary | ICD-10-CM | POA: Diagnosis not present

## 2016-03-03 ENCOUNTER — Other Ambulatory Visit: Payer: Self-pay | Admitting: Family Medicine

## 2016-03-15 ENCOUNTER — Encounter: Payer: Medicare Other | Admitting: Family Medicine

## 2016-04-01 ENCOUNTER — Ambulatory Visit (INDEPENDENT_AMBULATORY_CARE_PROVIDER_SITE_OTHER): Payer: Medicare Other | Admitting: Family Medicine

## 2016-04-01 ENCOUNTER — Encounter: Payer: Self-pay | Admitting: Family Medicine

## 2016-04-01 VITALS — BP 112/80 | HR 79 | Temp 98.4°F | Ht 68.0 in | Wt 180.1 lb

## 2016-04-01 DIAGNOSIS — H8109 Meniere's disease, unspecified ear: Secondary | ICD-10-CM | POA: Diagnosis not present

## 2016-04-01 DIAGNOSIS — R05 Cough: Secondary | ICD-10-CM

## 2016-04-01 DIAGNOSIS — R600 Localized edema: Secondary | ICD-10-CM

## 2016-04-01 DIAGNOSIS — Z0000 Encounter for general adult medical examination without abnormal findings: Secondary | ICD-10-CM | POA: Diagnosis not present

## 2016-04-01 DIAGNOSIS — R059 Cough, unspecified: Secondary | ICD-10-CM

## 2016-04-01 DIAGNOSIS — I10 Essential (primary) hypertension: Secondary | ICD-10-CM

## 2016-04-01 DIAGNOSIS — E782 Mixed hyperlipidemia: Secondary | ICD-10-CM

## 2016-04-01 DIAGNOSIS — R739 Hyperglycemia, unspecified: Secondary | ICD-10-CM | POA: Diagnosis not present

## 2016-04-01 HISTORY — DX: Localized edema: R60.0

## 2016-04-01 LAB — TSH: TSH: 2.3 u[IU]/mL (ref 0.35–4.50)

## 2016-04-01 LAB — COMPREHENSIVE METABOLIC PANEL
ALT: 13 U/L (ref 0–35)
AST: 14 U/L (ref 0–37)
Albumin: 4.2 g/dL (ref 3.5–5.2)
Alkaline Phosphatase: 86 U/L (ref 39–117)
BILIRUBIN TOTAL: 0.6 mg/dL (ref 0.2–1.2)
BUN: 16 mg/dL (ref 6–23)
CO2: 27 meq/L (ref 19–32)
CREATININE: 0.83 mg/dL (ref 0.40–1.20)
Calcium: 9.8 mg/dL (ref 8.4–10.5)
Chloride: 102 mEq/L (ref 96–112)
GFR: 71.21 mL/min (ref >60.00)
GLUCOSE: 87 mg/dL (ref 70–99)
Potassium: 4 mEq/L (ref 3.5–5.1)
Sodium: 136 mEq/L (ref 135–145)
Total Protein: 7 g/dL (ref 6.0–8.3)

## 2016-04-01 LAB — LIPID PANEL
Cholesterol: 194 mg/dL (ref 0–200)
HDL: 69.5 mg/dL (ref >39.00)
NONHDL: 124.29
Total CHOL/HDL Ratio: 3
Triglycerides: 243 mg/dL — ABNORMAL HIGH (ref 0.0–149.0)
VLDL: 48.6 mg/dL — ABNORMAL HIGH (ref 0.0–40.0)

## 2016-04-01 LAB — CBC
HCT: 45.6 % (ref 36.0–46.0)
Hemoglobin: 15.2 g/dL — ABNORMAL HIGH (ref 12.0–15.0)
MCHC: 33.3 g/dL (ref 30.0–36.0)
MCV: 88.8 fl (ref 78.0–100.0)
Platelets: 277 10*3/uL (ref 150.0–400.0)
RBC: 5.14 Mil/uL — ABNORMAL HIGH (ref 3.87–5.11)
RDW: 14 % (ref 11.5–15.5)
WBC: 7.4 10*3/uL (ref 4.0–10.5)

## 2016-04-01 LAB — LDL CHOLESTEROL, DIRECT: LDL DIRECT: 82 mg/dL

## 2016-04-01 MED ORDER — ATORVASTATIN CALCIUM 10 MG PO TABS: 10.0000 mg | ORAL_TABLET | Freq: Every day | ORAL | 2 refills | Status: DC

## 2016-04-01 MED ORDER — METOPROLOL SUCCINATE ER 50 MG PO TB24
50.0000 mg | ORAL_TABLET | Freq: Every day | ORAL | 2 refills | Status: DC
Start: 2016-04-01 — End: 2016-07-20

## 2016-04-01 NOTE — Assessment & Plan Note (Addendum)
Had a bad vertiginous attack once last month for the first time in 9 years. No recurrence since then. Make sure to stay well hydrated, report recurrent or worsening symptoms

## 2016-04-01 NOTE — Progress Notes (Signed)
Pre visit review using our clinic review tool, if applicable. No additional management support is needed unless otherwise documented below in the visit note. 

## 2016-04-01 NOTE — Assessment & Plan Note (Signed)
Compression hose, elevation and minimize sodium

## 2016-04-01 NOTE — Progress Notes (Signed)
Patient ID: Jasmine Wilson, female   DOB: [birth year], 75 y.o.   MRN: [id number]   Subjective:    Patient ID: Jasmine Wilson, female    DOB: 1940-08-06, 75 y.o.   MRN: [id number]  Chief Complaint  Patient presents with  . Annual Exam  . Dizziness    HPI Patient is in today for annual preventative exam and follow up on numerous medical concerns. Declined flu shot today. Had trouble sleeping last night but usually sleeps better. She did have a bad episode of vertigo about a month ago with nausea, diaphoresis and more. Denies CP/palp/SOB/HA/congestion/fevers/GI or GU c/o. Taking meds as prescribed  Past Medical History:  Diagnosis Date  . Abdominal pain 03/02/2011  . Arthritis 03/02/2012  . Bronchitis 04/04/2011  . Cataract   . Chicken pox as a child  . Cough 01/29/2013  . Female bladder prolapse, acquired 01/29/2011  . Glaucoma 01/29/2011  . Hearing aid worn   . History of pneumonia   . HTN (hypertension) 01/29/2011  . Hyperlipidemia   . Measles as a child  . Meniere's disease   . Pedal edema 04/01/2016  . Preventative health care 01/29/2011  . Shingles   . Vaginosis 01/29/2013    Past Surgical History:  Procedure Laterality Date  . lump removed on left breast     clogged milk duct, benign  . TUBAL LIGATION      Family History  Problem Relation Age of Onset  . Breast cancer Mother   . Stroke Father   . Heart disease Father   . Hyperlipidemia Father   . Hypertension Father   . Breast cancer Sister 61  . Uterine cancer Maternal Grandmother     uterus    Social History   Social History  . Marital status: Widowed    Spouse name: N/A  . Number of children: 3  . Years of education: N/A   Occupational History  . Retired    Social History Main Topics  . Smoking status: Former Smoker    Types: Cigarettes    Quit date: 06/21/1970  . Smokeless tobacco: Never Used     Comment: occasional smoker  . Alcohol use No  . Drug use: No  . Sexual activity: No   Other Topics  Concern  . Not on file   Social History Narrative  . No narrative on file    Outpatient Medications Prior to Visit  Medication Sig Dispense Refill  . atorvastatin (LIPITOR) 10 MG tablet Take 1 tablet (10 mg total) by mouth daily. 90 tablet 2  . atorvastatin (LIPITOR) 10 MG tablet TAKE ONE TABLET BY MOUTH DAILY. 30 tablet 3  . atorvastatin (LIPITOR) 10 MG tablet Take 1 tablet by mouth  daily 90 tablet 0  . cetirizine (ZYRTEC) 10 MG tablet Take 1 tablet (10 mg total) by mouth daily as needed for allergies or rhinitis (cough). 90 tablet 1  . dorzolamide (TRUSOPT) 2 % ophthalmic solution Place 1 drop into both eyes 2 (two) times daily.     Marland Kitchen latanoprost (XALATAN) 0.005 % ophthalmic solution     . timolol (BETIMOL) 0.5 % ophthalmic solution Place 1 drop into both eyes 2 (two) times daily.    Marland Kitchen atorvastatin (LIPITOR) 10 MG tablet Take 1 tablet by mouth  daily 90 tablet 0  . chlorpheniramine-HYDROcodone (TUSSIONEX PENNKINETIC ER) 10-8 MG/5ML SUER Take 5 mLs by mouth at bedtime as needed for cough. 240 mL 0  . metoprolol succinate (TOPROL-XL) 50 MG 24 hr tablet  Take 1 tablet by mouth  daily with or immediately  following a meal 90 tablet 0   No facility-administered medications prior to visit.     Allergies  Allergen Reactions  . Codeine Other (See Comments)    Makes feel funny    Review of Systems  Constitutional: Negative for chills, fever and malaise/fatigue.  HENT: Negative for congestion and hearing loss.   Eyes: Negative for blurred vision and discharge.  Respiratory: Negative for cough, sputum production and shortness of breath.   Cardiovascular: Positive for leg swelling. Negative for chest pain and palpitations.  Gastrointestinal: Negative for abdominal pain, blood in stool, constipation, diarrhea, heartburn, nausea and vomiting.  Genitourinary: Negative for dysuria, frequency, hematuria and urgency.  Musculoskeletal: Negative for back pain, falls and myalgias.  Skin: Negative  for rash.  Neurological: Positive for dizziness. Negative for sensory change, loss of consciousness, weakness and headaches.  Endo/Heme/Allergies: Negative for environmental allergies. Does not bruise/bleed easily.  Psychiatric/Behavioral: Negative for depression and suicidal ideas. The patient is not nervous/anxious and does not have insomnia.        Objective:    Physical Exam  Constitutional: She is oriented to person, place, and time. She appears well-developed and well-nourished. No distress.  HENT:  Head: Normocephalic and atraumatic.  Eyes: Conjunctivae are normal.  Neck: Neck supple. No thyromegaly present.  Cardiovascular: Normal rate, regular rhythm and normal heart sounds.   No murmur heard. Pulmonary/Chest: Effort normal and breath sounds normal. No respiratory distress.  Abdominal: Soft. Bowel sounds are normal. She exhibits no distension and no mass. There is no tenderness.  Musculoskeletal: She exhibits no edema.  Lymphadenopathy:    She has no cervical adenopathy.  Neurological: She is alert and oriented to person, place, and time.  Skin: Skin is warm and dry.  Psychiatric: She has a normal mood and affect. Her behavior is normal.    BP 112/80 (BP Location: Right Arm, Patient Position: Sitting, Cuff Size: Normal)   Pulse 79   Temp 98.4 F (36.9 C) (Oral)   Ht 5\' 8"  (1.727 m)   Wt 180 lb 2 oz (81.7 kg)   SpO2 95%   BMI 27.39 kg/m  Wt Readings from Last 3 Encounters:  04/01/16 180 lb 2 oz (81.7 kg)  07/31/15 176 lb 2 oz (79.9 kg)  05/06/15 175 lb (79.4 kg)     Lab Results  Component Value Date   WBC 7.4 04/01/2016   HGB 15.2 (H) 04/01/2016   HCT 45.6 04/01/2016   PLT 277.0 04/01/2016   GLUCOSE 87 04/01/2016   CHOL 194 04/01/2016   TRIG 243.0 (H) 04/01/2016   HDL 69.50 04/01/2016   LDLDIRECT 82.0 04/01/2016   LDLCALC 82 05/06/2015   ALT 13 04/01/2016   AST 14 04/01/2016   NA 136 04/01/2016   K 4.0 04/01/2016   CL 102 04/01/2016   CREATININE  0.83 04/01/2016   BUN 16 04/01/2016   CO2 27 04/01/2016   TSH 2.30 04/01/2016    Lab Results  Component Value Date   TSH 2.30 04/01/2016   Lab Results  Component Value Date   WBC 7.4 04/01/2016   HGB 15.2 (H) 04/01/2016   HCT 45.6 04/01/2016   MCV 88.8 04/01/2016   PLT 277.0 04/01/2016   Lab Results  Component Value Date   NA 136 04/01/2016   K 4.0 04/01/2016   CO2 27 04/01/2016   GLUCOSE 87 04/01/2016   BUN 16 04/01/2016   CREATININE 0.83 04/01/2016   BILITOT  0.6 04/01/2016   ALKPHOS 86 04/01/2016   AST 14 04/01/2016   ALT 13 04/01/2016   PROT 7.0 04/01/2016   ALBUMIN 4.2 04/01/2016   CALCIUM 9.8 04/01/2016   GFR 71.21 04/01/2016   Lab Results  Component Value Date   CHOL 194 04/01/2016   Lab Results  Component Value Date   HDL 69.50 04/01/2016   Lab Results  Component Value Date   LDLCALC 82 05/06/2015   Lab Results  Component Value Date   TRIG 243.0 (H) 04/01/2016   Lab Results  Component Value Date   CHOLHDL 3 04/01/2016   No results found for: HGBA1C     Assessment & Plan:   Problem List Items Addressed This Visit    Hyperlipidemia    Tolerating statin, encouraged heart healthy diet, avoid trans fats, minimize simple carbs and saturated fats. Increase exercise as tolerated      Relevant Medications   metoprolol succinate (TOPROL-XL) 50 MG 24 hr tablet   atorvastatin (LIPITOR) 10 MG tablet   Other Relevant Orders   Lipid panel (Completed)   Meniere's disease    Had a bad vertiginous attack once last month for the first time in 9 years. No recurrence since then. Make sure to stay well hydrated, report recurrent or worsening symptoms      Preventative health care    Patient encouraged to maintain heart healthy diet, regular exercise, adequate sleep. Consider daily probiotics. Take medications as prescribed. Has an HCP and Living Will, she will provide [place] with copy      HTN (hypertension)    Well controlled, no changes to meds.  Encouraged heart healthy diet such as the DASH diet and exercise as tolerated.       Relevant Medications   metoprolol succinate (TOPROL-XL) 50 MG 24 hr tablet   atorvastatin (LIPITOR) 10 MG tablet   Other Relevant Orders   CBC (Completed)   TSH (Completed)   Comprehensive metabolic panel (Completed)   Cough   Pedal edema    Compression hose, elevation and minimize sodium       Other Visit Diagnoses    Hyperglycemia    -  Primary      I have discontinued Ms. Kott's chlorpheniramine-HYDROcodone. I have also changed her metoprolol succinate and atorvastatin. Additionally, I am having her maintain her latanoprost, dorzolamide, cetirizine, timolol, atorvastatin, atorvastatin, and atorvastatin.  Meds ordered this encounter  Medications  . metoprolol succinate (TOPROL-XL) 50 MG 24 hr tablet    Sig: Take 1 tablet (50 mg total) by mouth daily. Take with or immediately following a meal.    Dispense:  90 tablet    Refill:  2  . atorvastatin (LIPITOR) 10 MG tablet    Sig: Take 1 tablet (10 mg total) by mouth daily.    Dispense:  90 tablet    Refill:  2     Penni Homans, MD

## 2016-04-01 NOTE — Assessment & Plan Note (Signed)
Patient encouraged to maintain heart healthy diet, regular exercise, adequate sleep. Consider daily probiotics. Take medications as prescribed. Has an HCP and Living Will, she will provide [place] with copy

## 2016-04-01 NOTE — Patient Instructions (Addendum)
Compression hose, knee light weight 10-20 mmhg, [employer],  Or in [employer] Minimize sodium Elevate feet.   Preventive Care for Adults, Female A healthy lifestyle and preventive care can promote health and wellness. Preventive health guidelines for women include the following key practices.  A routine yearly physical is a good way to check with your health care provider about your health and preventive screening. It is a chance to share any concerns and updates on your health and to receive a thorough exam.  Visit your dentist for a routine exam and preventive care every 6 months. Brush your teeth twice a day and floss once a day. Good oral hygiene prevents tooth decay and gum disease.  The frequency of eye exams is based on your age, health, family medical history, use of contact lenses, and other factors. Follow your health care provider's recommendations for frequency of eye exams.  Eat a healthy diet. Foods like vegetables, fruits, whole grains, low-fat dairy products, and lean protein foods contain the nutrients you need without too many calories. Decrease your intake of foods high in solid fats, added sugars, and salt. Eat the right amount of calories for you.Get information about a proper diet from your health care provider, if necessary.  Regular physical exercise is one of the most important things you can do for your health. Most adults should get at least 150 minutes of moderate-intensity exercise (any activity that increases your heart rate and causes you to sweat) each week. In addition, most adults need muscle-strengthening exercises on 2 or more days a week.  Maintain a healthy weight. The body mass index (BMI) is a screening tool to identify possible weight problems. It provides an estimate of body fat based on height and weight. Your health care provider can find your BMI and can help you achieve or maintain a healthy weight.For adults 20 years and  older:  A BMI below 18.5 is considered underweight.  A BMI of 18.5 to 24.9 is normal.  A BMI of 25 to 29.9 is considered overweight.  A BMI of 30 and above is considered obese.  Maintain normal blood lipids and cholesterol levels by exercising and minimizing your intake of saturated fat. Eat a balanced diet with plenty of fruit and vegetables. Blood tests for lipids and cholesterol should begin at age 81 and be repeated every 5 years. If your lipid or cholesterol levels are high, you are over 50, or you are at high risk for heart disease, you may need your cholesterol levels checked more frequently.Ongoing high lipid and cholesterol levels should be treated with medicines if diet and exercise are not working.  If you smoke, find out from your health care provider how to quit. If you do not use tobacco, do not start.  Lung cancer screening is recommended for adults aged 55-80 years who are at high risk for developing lung cancer because of a history of smoking. A yearly low-dose CT scan of the lungs is recommended for people who have at least a 30-pack-year history of smoking and are a current smoker or have quit within the past 15 years. A pack year of smoking is smoking an average of 1 pack of cigarettes a day for 1 year (for example: 1 pack a day for 30 years or 2 packs a day for 15 years). Yearly screening should continue until the smoker has stopped smoking for at least 15 years. Yearly screening should be stopped for people who develop a health problem  that would prevent them from having lung cancer treatment.  If you are pregnant, do not drink alcohol. If you are breastfeeding, be very cautious about drinking alcohol. If you are not pregnant and choose to drink alcohol, do not have more than 1 drink per day. One drink is considered to be 12 ounces (355 mL) of beer, 5 ounces (148 mL) of wine, or 1.5 ounces (44 mL) of liquor.  Avoid use of street drugs. Do not share needles with anyone. Ask  for help if you need support or instructions about stopping the use of drugs.  High blood pressure causes heart disease and increases the risk of stroke. Your blood pressure should be checked at least every 1 to 2 years. Ongoing high blood pressure should be treated with medicines if weight loss and exercise do not work.  If you are 40-45 years old, ask your health care provider if you should take aspirin to prevent strokes.  Diabetes screening is done by taking a blood sample to check your blood glucose level after you have not eaten for a certain period of time (fasting). If you are not overweight and you do not have risk factors for diabetes, you should be screened once every 3 years starting at age 11. If you are overweight or obese and you are 54-72 years of age, you should be screened for diabetes every year as part of your cardiovascular risk assessment.  Breast cancer screening is essential preventive care for women. You should practice "breast self-awareness." This means understanding the normal appearance and feel of your breasts and may include breast self-examination. Any changes detected, no matter how small, should be reported to a health care provider. Women in their 46s and 30s should have a clinical breast exam (CBE) by a health care provider as part of a regular health exam every 1 to 3 years. After age 80, women should have a CBE every year. Starting at age 75, women should consider having a mammogram (breast X-ray test) every year. Women who have a family history of breast cancer should talk to their health care provider about genetic screening. Women at a high risk of breast cancer should talk to their health care providers about having an MRI and a mammogram every year.  Breast cancer gene (BRCA)-related cancer risk assessment is recommended for women who have family members with BRCA-related cancers. BRCA-related cancers include breast, ovarian, tubal, and peritoneal cancers. Having  family members with these cancers may be associated with an increased risk for harmful changes (mutations) in the breast cancer genes BRCA1 and BRCA2. Results of the assessment will determine the need for genetic counseling and BRCA1 and BRCA2 testing.  Your health care provider may recommend that you be screened regularly for cancer of the pelvic organs (ovaries, uterus, and vagina). This screening involves a pelvic examination, including checking for microscopic changes to the surface of your cervix (Pap test). You may be encouraged to have this screening done every 3 years, beginning at age 78.  For women ages 75-65, health care providers may recommend pelvic exams and Pap testing every 3 years, or they may recommend the Pap and pelvic exam, combined with testing for human papilloma virus (HPV), every 5 years. Some types of HPV increase your risk of cervical cancer. Testing for HPV may also be done on women of any age with unclear Pap test results.  Other health care providers may not recommend any screening for nonpregnant women who are considered low risk for  pelvic cancer and who do not have symptoms. Ask your health care provider if a screening pelvic exam is right for you.  If you have had past treatment for cervical cancer or a condition that could lead to cancer, you need Pap tests and screening for cancer for at least 20 years after your treatment. If Pap tests have been discontinued, your risk factors (such as having a new sexual partner) need to be reassessed to determine if screening should resume. Some women have medical problems that increase the chance of getting cervical cancer. In these cases, your health care provider may recommend more frequent screening and Pap tests.  Colorectal cancer can be detected and often prevented. Most routine colorectal cancer screening begins at the age of 66 years and continues through age 3 years. However, your health care provider may recommend  screening at an earlier age if you have risk factors for colon cancer. On a yearly basis, your health care provider may provide home test kits to check for hidden blood in the stool. Use of a small camera at the end of a tube, to directly examine the colon (sigmoidoscopy or colonoscopy), can detect the earliest forms of colorectal cancer. Talk to your health care provider about this at age 67, when routine screening begins. Direct exam of the colon should be repeated every 5-10 years through age 75 years, unless early forms of precancerous polyps or small growths are found.  People who are at an increased risk for hepatitis B should be screened for this virus. You are considered at high risk for hepatitis B if:  You were born in a country where hepatitis B occurs often. Talk with your health care provider about which countries are considered high risk.  Your parents were born in a high-risk country and you have not received a shot to protect against hepatitis B (hepatitis B vaccine).  You have HIV or AIDS.  You use needles to inject street drugs.  You live with, or have sex with, someone who has hepatitis B.  You get hemodialysis treatment.  You take certain medicines for conditions like cancer, organ transplantation, and autoimmune conditions.  Hepatitis C blood testing is recommended for all people born from [birth year] through 1965 and any individual with known risks for hepatitis C.  Practice safe sex. Use condoms and avoid high-risk sexual practices to reduce the spread of sexually transmitted infections (STIs). STIs include gonorrhea, chlamydia, syphilis, trichomonas, herpes, HPV, and human immunodeficiency virus (HIV). Herpes, HIV, and HPV are viral illnesses that have no cure. They can result in disability, cancer, and death.  You should be screened for sexually transmitted illnesses (STIs) including gonorrhea and chlamydia if:  You are sexually active and are younger than 24 years.  You  are older than 24 years and your health care provider tells you that you are at risk for this type of infection.  Your sexual activity has changed since you were last screened and you are at an increased risk for chlamydia or gonorrhea. Ask your health care provider if you are at risk.  If you are at risk of being infected with HIV, it is recommended that you take a prescription medicine daily to prevent HIV infection. This is called preexposure prophylaxis (PrEP). You are considered at risk if:  You are sexually active and do not regularly use condoms or know the HIV status of your partner(s).  You take drugs by injection.  You are sexually active with a partner who has  HIV.  Talk with your health care provider about whether you are at high risk of being infected with HIV. If you choose to begin PrEP, you should first be tested for HIV. You should then be tested every 3 months for as long as you are taking PrEP.  Osteoporosis is a disease in which the bones lose minerals and strength with aging. This can result in serious bone fractures or breaks. The risk of osteoporosis can be identified using a bone density scan. Women ages 84 years and over and women at risk for fractures or osteoporosis should discuss screening with their health care providers. Ask your health care provider whether you should take a calcium supplement or vitamin D to reduce the rate of osteoporosis.  Menopause can be associated with physical symptoms and risks. Hormone replacement therapy is available to decrease symptoms and risks. You should talk to your health care provider about whether hormone replacement therapy is right for you.  Use sunscreen. Apply sunscreen liberally and repeatedly throughout the day. You should seek shade when your shadow is shorter than you. Protect yourself by wearing long sleeves, pants, a wide-brimmed hat, and sunglasses year round, whenever you are outdoors.  Once a month, do a whole body  skin exam, using a mirror to look at the skin on your back. Tell your health care provider of new moles, moles that have irregular borders, moles that are larger than a pencil eraser, or moles that have changed in shape or color.  Stay current with required vaccines (immunizations).  Influenza vaccine. All adults should be immunized every year.  Tetanus, diphtheria, and acellular pertussis (Td, Tdap) vaccine. Pregnant women should receive 1 dose of Tdap vaccine during each pregnancy. The dose should be obtained regardless of the length of time since the last dose. Immunization is preferred during the 27th-36th week of gestation. An adult who has not previously received Tdap or who does not know her vaccine status should receive 1 dose of Tdap. This initial dose should be followed by tetanus and diphtheria toxoids (Td) booster doses every 10 years. Adults with an unknown or incomplete history of completing a 3-dose immunization series with Td-containing vaccines should begin or complete a primary immunization series including a Tdap dose. Adults should receive a Td booster every 10 years.  Varicella vaccine. An adult without evidence of immunity to varicella should receive 2 doses or a second dose if she has previously received 1 dose. Pregnant females who do not have evidence of immunity should receive the first dose after pregnancy. This first dose should be obtained before leaving the health care facility. The second dose should be obtained 4-8 weeks after the first dose.  Human papillomavirus (HPV) vaccine. Females aged 13-26 years who have not received the vaccine previously should obtain the 3-dose series. The vaccine is not recommended for use in pregnant females. However, pregnancy testing is not needed before receiving a dose. If a female is found to be pregnant after receiving a dose, no treatment is needed. In that case, the remaining doses should be delayed until after the pregnancy.  Immunization is recommended for any person with an immunocompromised condition through the age of 13 years if she did not get any or all doses earlier. During the 3-dose series, the second dose should be obtained 4-8 weeks after the first dose. The third dose should be obtained 24 weeks after the first dose and 16 weeks after the second dose.  Zoster vaccine. One dose is recommended  for adults aged 43 years or older unless certain conditions are present.  Measles, mumps, and rubella (MMR) vaccine. Adults born before [birth year] generally are considered immune to measles and mumps. Adults born in [birth year] or later should have 1 or more doses of MMR vaccine unless there is a contraindication to the vaccine or there is laboratory evidence of immunity to each of the three diseases. A routine second dose of MMR vaccine should be obtained at least 28 days after the first dose for students attending postsecondary schools, health care workers, or international travelers. People who received inactivated measles vaccine or an unknown type of measles vaccine during 1963-1967 should receive 2 doses of MMR vaccine. People who received inactivated mumps vaccine or an unknown type of mumps vaccine before 1979 and are at high risk for mumps infection should consider immunization with 2 doses of MMR vaccine. For females of childbearing age, rubella immunity should be determined. If there is no evidence of immunity, females who are not pregnant should be vaccinated. If there is no evidence of immunity, females who are pregnant should delay immunization until after pregnancy. Unvaccinated health care workers born before [birth year] who lack laboratory evidence of measles, mumps, or rubella immunity or laboratory confirmation of disease should consider measles and mumps immunization with 2 doses of MMR vaccine or rubella immunization with 1 dose of MMR vaccine.  Pneumococcal 13-valent conjugate (PCV13) vaccine. When indicated, a person who is  uncertain of his immunization history and has no record of immunization should receive the PCV13 vaccine. All adults 75 years of age and older should receive this vaccine. An adult aged 53 years or older who has certain medical conditions and has not been previously immunized should receive 1 dose of PCV13 vaccine. This PCV13 should be followed with a dose of pneumococcal polysaccharide (PPSV23) vaccine. Adults who are at high risk for pneumococcal disease should obtain the PPSV23 vaccine at least 8 weeks after the dose of PCV13 vaccine. Adults older than 75 years of age who have normal immune system function should obtain the PPSV23 vaccine dose at least 1 year after the dose of PCV13 vaccine.  Pneumococcal polysaccharide (PPSV23) vaccine. When PCV13 is also indicated, PCV13 should be obtained first. All adults aged 44 years and older should be immunized. An adult younger than age 49 years who has certain medical conditions should be immunized. Any person who resides in a nursing home or long-term care facility should be immunized. An adult smoker should be immunized. People with an immunocompromised condition and certain other conditions should receive both PCV13 and PPSV23 vaccines. People with human immunodeficiency virus (HIV) infection should be immunized as soon as possible after diagnosis. Immunization during chemotherapy or radiation therapy should be avoided. Routine use of PPSV23 vaccine is not recommended for American Indians, [place] Natives, or people younger than 65 years unless there are medical conditions that require PPSV23 vaccine. When indicated, people who have unknown immunization and have no record of immunization should receive PPSV23 vaccine. One-time revaccination 5 years after the first dose of PPSV23 is recommended for people aged 19-64 years who have chronic kidney failure, nephrotic syndrome, asplenia, or immunocompromised conditions. People who received 1-2 doses of PPSV23 before age  67 years should receive another dose of PPSV23 vaccine at age 57 years or later if at least 5 years have passed since the previous dose. Doses of PPSV23 are not needed for people immunized with PPSV23 at or after age 59 years.  Meningococcal vaccine. Adults with  asplenia or persistent complement component deficiencies should receive 2 doses of quadrivalent meningococcal conjugate (MenACWY-D) vaccine. The doses should be obtained at least 2 months apart. Microbiologists working with certain meningococcal bacteria, [employer] recruits, people at risk during an outbreak, and people who travel to or live in countries with a high rate of meningitis should be immunized. A first-year college student up through age 13 years who is living in a residence hall should receive a dose if she did not receive a dose on or after her 16th birthday. Adults who have certain high-risk conditions should receive one or more doses of vaccine.  Hepatitis A vaccine. Adults who wish to be protected from this disease, have certain high-risk conditions, work with hepatitis A-infected animals, work in hepatitis A research labs, or travel to or work in countries with a high rate of hepatitis A should be immunized. Adults who were previously unvaccinated and who anticipate close contact with an international adoptee during the first 60 days after arrival in the [place] from a country with a high rate of hepatitis A should be immunized.  Hepatitis B vaccine. Adults who wish to be protected from this disease, have certain high-risk conditions, may be exposed to blood or other infectious body fluids, are household contacts or sex partners of hepatitis B positive people, are clients or workers in certain care facilities, or travel to or work in countries with a high rate of hepatitis B should be immunized.  Haemophilus influenzae type b (Hib) vaccine. A previously unvaccinated person with asplenia or sickle cell disease or having a  scheduled splenectomy should receive 1 dose of Hib vaccine. Regardless of previous immunization, a recipient of a hematopoietic stem cell transplant should receive a 3-dose series 6-12 months after her successful transplant. Hib vaccine is not recommended for adults with HIV infection. Preventive Services / Frequency Ages 37 to 78 years  Blood pressure check.** / Every 3-5 years.  Lipid and cholesterol check.** / Every 5 years beginning at age 99.  Clinical breast exam.** / Every 3 years for women in their 72s and 76s.  BRCA-related cancer risk assessment.** / For women who have family members with a BRCA-related cancer (breast, ovarian, tubal, or peritoneal cancers).  Pap test.** / Every 2 years from ages 57 through 53. Every 3 years starting at age 24 through age 5 or 57 with a history of 3 consecutive normal Pap tests.  HPV screening.** / Every 3 years from ages 61 through ages 61 to 42 with a history of 3 consecutive normal Pap tests.  Hepatitis C blood test.** / For any individual with known risks for hepatitis C.  Skin self-exam. / Monthly.  Influenza vaccine. / Every year.  Tetanus, diphtheria, and acellular pertussis (Tdap, Td) vaccine.** / Consult your health care provider. Pregnant women should receive 1 dose of Tdap vaccine during each pregnancy. 1 dose of Td every 10 years.  Varicella vaccine.** / Consult your health care provider. Pregnant females who do not have evidence of immunity should receive the first dose after pregnancy.  HPV vaccine. / 3 doses over 6 months, if 80 and younger. The vaccine is not recommended for use in pregnant females. However, pregnancy testing is not needed before receiving a dose.  Measles, mumps, rubella (MMR) vaccine.** / You need at least 1 dose of MMR if you were born in 1957 or later. You may also need a 2nd dose. For females of childbearing age, rubella immunity should be determined. If there is no  evidence of immunity, females who are not  pregnant should be vaccinated. If there is no evidence of immunity, females who are pregnant should delay immunization until after pregnancy.  Pneumococcal 13-valent conjugate (PCV13) vaccine.** / Consult your health care provider.  Pneumococcal polysaccharide (PPSV23) vaccine.** / 1 to 2 doses if you smoke cigarettes or if you have certain conditions.  Meningococcal vaccine.** / 1 dose if you are age 3 to 68 years and a Market researcher living in a residence hall, or have one of several medical conditions, you need to get vaccinated against meningococcal disease. You may also need additional booster doses.  Hepatitis A vaccine.** / Consult your health care provider.  Hepatitis B vaccine.** / Consult your health care provider.  Haemophilus influenzae type b (Hib) vaccine.** / Consult your health care provider. Ages 62 to 72 years  Blood pressure check.** / Every year.  Lipid and cholesterol check.** / Every 5 years beginning at age 73 years.  Lung cancer screening. / Every year if you are aged 88-80 years and have a 30-pack-year history of smoking and currently smoke or have quit within the past 15 years. Yearly screening is stopped once you have quit smoking for at least 15 years or develop a health problem that would prevent you from having lung cancer treatment.  Clinical breast exam.** / Every year after age 5 years.  BRCA-related cancer risk assessment.** / For women who have family members with a BRCA-related cancer (breast, ovarian, tubal, or peritoneal cancers).  Mammogram.** / Every year beginning at age 41 years and continuing for as long as you are in good health. Consult with your health care provider.  Pap test.** / Every 3 years starting at age 58 years through age 46 or 26 years with a history of 3 consecutive normal Pap tests.  HPV screening.** / Every 3 years from ages 64 years through ages 8 to 26 years with a history of 3 consecutive normal Pap  tests.  Fecal occult blood test (FOBT) of stool. / Every year beginning at age 48 years and continuing until age 28 years. You may not need to do this test if you get a colonoscopy every 10 years.  Flexible sigmoidoscopy or colonoscopy.** / Every 5 years for a flexible sigmoidoscopy or every 10 years for a colonoscopy beginning at age 5 years and continuing until age 35 years.  Hepatitis C blood test.** / For all people born from [birth year] through 1965 and any individual with known risks for hepatitis C.  Skin self-exam. / Monthly.  Influenza vaccine. / Every year.  Tetanus, diphtheria, and acellular pertussis (Tdap/Td) vaccine.** / Consult your health care provider. Pregnant women should receive 1 dose of Tdap vaccine during each pregnancy. 1 dose of Td every 10 years.  Varicella vaccine.** / Consult your health care provider. Pregnant females who do not have evidence of immunity should receive the first dose after pregnancy.  Zoster vaccine.** / 1 dose for adults aged 31 years or older.  Measles, mumps, rubella (MMR) vaccine.** / You need at least 1 dose of MMR if you were born in 1957 or later. You may also need a second dose. For females of childbearing age, rubella immunity should be determined. If there is no evidence of immunity, females who are not pregnant should be vaccinated. If there is no evidence of immunity, females who are pregnant should delay immunization until after pregnancy.  Pneumococcal 13-valent conjugate (PCV13) vaccine.** / Consult your health care provider.  Pneumococcal polysaccharide (  PPSV23) vaccine.** / 1 to 2 doses if you smoke cigarettes or if you have certain conditions.  Meningococcal vaccine.** / Consult your health care provider.  Hepatitis A vaccine.** / Consult your health care provider.  Hepatitis B vaccine.** / Consult your health care provider.  Haemophilus influenzae type b (Hib) vaccine.** / Consult your health care provider. Ages 66 years and  over  Blood pressure check.** / Every year.  Lipid and cholesterol check.** / Every 5 years beginning at age 54 years.  Lung cancer screening. / Every year if you are aged 71-80 years and have a 30-pack-year history of smoking and currently smoke or have quit within the past 15 years. Yearly screening is stopped once you have quit smoking for at least 15 years or develop a health problem that would prevent you from having lung cancer treatment.  Clinical breast exam.** / Every year after age 36 years.  BRCA-related cancer risk assessment.** / For women who have family members with a BRCA-related cancer (breast, ovarian, tubal, or peritoneal cancers).  Mammogram.** / Every year beginning at age 6 years and continuing for as long as you are in good health. Consult with your health care provider.  Pap test.** / Every 3 years starting at age 5 years through age 29 or 42 years with 3 consecutive normal Pap tests. Testing can be stopped between 65 and 70 years with 3 consecutive normal Pap tests and no abnormal Pap or HPV tests in the past 10 years.  HPV screening.** / Every 3 years from ages 75 years through ages 72 or 32 years with a history of 3 consecutive normal Pap tests. Testing can be stopped between 65 and 70 years with 3 consecutive normal Pap tests and no abnormal Pap or HPV tests in the past 10 years.  Fecal occult blood test (FOBT) of stool. / Every year beginning at age 58 years and continuing until age 78 years. You may not need to do this test if you get a colonoscopy every 10 years.  Flexible sigmoidoscopy or colonoscopy.** / Every 5 years for a flexible sigmoidoscopy or every 10 years for a colonoscopy beginning at age 51 years and continuing until age 61 years.  Hepatitis C blood test.** / For all people born from [birth year] through 1965 and any individual with known risks for hepatitis C.  Osteoporosis screening.** / A one-time screening for women ages 30 years and over and women  at risk for fractures or osteoporosis.  Skin self-exam. / Monthly.  Influenza vaccine. / Every year.  Tetanus, diphtheria, and acellular pertussis (Tdap/Td) vaccine.** / 1 dose of Td every 10 years.  Varicella vaccine.** / Consult your health care provider.  Zoster vaccine.** / 1 dose for adults aged 51 years or older.  Pneumococcal 13-valent conjugate (PCV13) vaccine.** / Consult your health care provider.  Pneumococcal polysaccharide (PPSV23) vaccine.** / 1 dose for all adults aged 79 years and older.  Meningococcal vaccine.** / Consult your health care provider.  Hepatitis A vaccine.** / Consult your health care provider.  Hepatitis B vaccine.** / Consult your health care provider.  Haemophilus influenzae type b (Hib) vaccine.** / Consult your health care provider. ** Family history and personal history of risk and conditions may change your health care provider's recommendations.   This information is not intended to replace advice given to you by your health care provider. Make sure you discuss any questions you have with your health care provider.   Document Released: 08/03/2001 Document Revised: 06/28/2014 Document  Reviewed: 11/02/2010 Elsevier Interactive Patient Education [employer].

## 2016-04-01 NOTE — Assessment & Plan Note (Signed)
Tolerating statin, encouraged heart healthy diet, avoid trans fats, minimize simple carbs and saturated fats. Increase exercise as tolerated 

## 2016-04-01 NOTE — Assessment & Plan Note (Signed)
Well controlled, no changes to meds. Encouraged heart healthy diet such as the DASH diet and exercise as tolerated.  °

## 2016-05-26 DIAGNOSIS — H401131 Primary open-angle glaucoma, bilateral, mild stage: Secondary | ICD-10-CM | POA: Diagnosis not present

## 2016-07-19 ENCOUNTER — Telehealth: Payer: Self-pay | Admitting: Family Medicine

## 2016-07-19 DIAGNOSIS — R059 Cough, unspecified: Secondary | ICD-10-CM

## 2016-07-19 DIAGNOSIS — R05 Cough: Secondary | ICD-10-CM

## 2016-07-19 NOTE — Telephone Encounter (Signed)
Patient is requesting a refill of cetirizine (ZYRTEC) 10 MG tablet and metoprolol succinate (TOPROL-XL) 50 MG 24 hr tablet  Please advise  **New Pharmacy**  Pharmacy: [facility]

## 2016-07-20 MED ORDER — CETIRIZINE HCL 10 MG PO TABS: 10.0000 mg | ORAL_TABLET | Freq: Every day | ORAL | 1 refills | Status: DC | PRN

## 2016-07-20 MED ORDER — METOPROLOL SUCCINATE ER 50 MG PO TB24
50.0000 mg | ORAL_TABLET | Freq: Every day | ORAL | 2 refills | Status: DC
Start: 2016-07-20 — End: 2016-07-22

## 2016-07-20 NOTE — Telephone Encounter (Signed)
Refills done as requested 

## 2016-07-22 MED ORDER — METOPROLOL SUCCINATE ER 50 MG PO TB24
50.0000 mg | ORAL_TABLET | Freq: Every day | ORAL | 2 refills | Status: DC
Start: 2016-07-22 — End: 2017-04-04

## 2016-07-22 MED ORDER — ATORVASTATIN CALCIUM 10 MG PO TABS
10.0000 mg | ORAL_TABLET | Freq: Every day | ORAL | 1 refills | Status: DC
Start: 2016-07-22 — End: 2017-02-01

## 2016-07-22 MED ORDER — CETIRIZINE HCL 10 MG PO TABS: 10.0000 mg | ORAL_TABLET | Freq: Every day | ORAL | 1 refills | Status: DC | PRN

## 2016-07-22 NOTE — Telephone Encounter (Signed)
Called metoprolol, atorvastatin and zyrtec to [facility] per Patient request. BUT, she then stated do not send in zyrtec (after already sent--so called [facility] to cancel the zyrtec)

## 2016-07-22 NOTE — Addendum Note (Signed)
Addended by: Sharon Seller B on: 07/22/2016 11:55 AM   Modules accepted: Orders

## 2016-07-22 NOTE — Telephone Encounter (Signed)
Pt called in she said that her Rx went to her local [facility] and it should have went to [facility].    Please assist with resending Rx.    Thanks.

## 2016-07-23 NOTE — Telephone Encounter (Signed)
Called again today [facility] 07/23/16 to cancel zyrtec, but at this time they still have not received.

## 2016-07-23 NOTE — Telephone Encounter (Signed)
Called [facility] at [phone] to cancel zyrtec. They never received it.   Will call back on Monday to followup. Pharmacist says it sometimes takes a couple days for them to get these refills.

## 2016-08-27 ENCOUNTER — Encounter: Payer: Self-pay | Admitting: Medical

## 2016-08-27 ENCOUNTER — Ambulatory Visit (INDEPENDENT_AMBULATORY_CARE_PROVIDER_SITE_OTHER): Payer: Medicare HMO | Admitting: Medical

## 2016-08-27 ENCOUNTER — Telehealth: Payer: Self-pay | Admitting: *Deleted

## 2016-08-27 VITALS — BP 125/70 | HR 93 | Temp 98.1°F | Resp 16 | Ht 68.0 in | Wt 187.0 lb

## 2016-08-27 DIAGNOSIS — J3489 Other specified disorders of nose and nasal sinuses: Secondary | ICD-10-CM | POA: Diagnosis not present

## 2016-08-27 DIAGNOSIS — R0981 Nasal congestion: Secondary | ICD-10-CM | POA: Diagnosis not present

## 2016-08-27 MED ORDER — FLUTICASONE PROPIONATE 50 MCG/ACT NA SUSP: 2.0000 | Freq: Every day | NASAL | 1 refills | Status: DC

## 2016-08-27 NOTE — Progress Notes (Signed)
Pre visit review using our clinic review tool, if applicable. No additional management support is needed unless otherwise documented below in the visit note/SLS  

## 2016-08-27 NOTE — Telephone Encounter (Signed)
Patient was seen in office and requested a letter from her PCP for change of goal weight at [employer] from her original goal of 169lb up to 175lb and states a doctor's note reporting that this is an approved goal weight change for patient is needed/SLS 03/09

## 2016-08-27 NOTE — Patient Instructions (Addendum)
For your rt nasal obstructed feeling/possible congested will rx flonase to use daily. I can't see all the way back in nostril so need to refer you to ent as they have better equipment to get deep visualization.(possible high positioned polyp?)  I will write the ent referral and if no one calls you by Mid week next week then advise call here and speak with Anderson Malta for update on referral.  Follow up with our office in 2 weeks if delay in appointment or sooner  if symptoms worsen or change.

## 2016-08-27 NOTE — Progress Notes (Signed)
Subjective:    Patient ID: Jasmine Wilson, female    DOB: May 29, 1941, 76 y.o.   MRN: [id number]  HPI  Pt in with sensation of her rt side nostril feeling blocked for about 2 weeks. Fells little better lying down. Pt feel like when she lays supine that she has better airflow through that nostril. Pt has some history of runny nose. No sneezing or itching eyes. No sinus pain. No fever and no chills.    Review of Systems  Constitutional: Negative for chills and fatigue.  HENT: Positive for congestion. Negative for nosebleeds, postnasal drip, sinus pressure, sneezing and sore throat.        Rt nares obsructed feeling high up.  Eyes: Negative for itching.  Respiratory: Negative for cough, chest tightness, shortness of breath and wheezing.   Cardiovascular: Negative for chest pain and palpitations.  Gastrointestinal: Negative for abdominal pain.  Musculoskeletal: Negative for back pain.    Past Medical History:  Diagnosis Date  . Abdominal pain 03/02/2011  . Arthritis 03/02/2012  . Bronchitis 04/04/2011  . Cataract   . Chicken pox as a child  . Cough 01/29/2013  . Female bladder prolapse, acquired 01/29/2011  . Glaucoma 01/29/2011  . Hearing aid worn   . History of pneumonia   . HTN (hypertension) 01/29/2011  . Hyperlipidemia   . Measles as a child  . Meniere's disease   . Pedal edema 04/01/2016  . Preventative health care 01/29/2011  . Shingles   . Vaginosis 01/29/2013     Social History   Social History  . Marital status: Widowed    Spouse name: N/A  . Number of children: 3  . Years of education: N/A   Occupational History  . Retired    Social History Main Topics  . Smoking status: Former Smoker    Types: Cigarettes    Quit date: 06/21/1970  . Smokeless tobacco: Never Used     Comment: occasional smoker  . Alcohol use No  . Drug use: No  . Sexual activity: No   Other Topics Concern  . Not on file   Social History Narrative  . No narrative on file    Past  Surgical History:  Procedure Laterality Date  . lump removed on left breast     clogged milk duct, benign  . TUBAL LIGATION      Family History  Problem Relation Age of Onset  . Breast cancer Mother   . Stroke Father   . Heart disease Father   . Hyperlipidemia Father   . Hypertension Father   . Breast cancer Sister 33  . Uterine cancer Maternal Grandmother     uterus    Allergies  Allergen Reactions  . Codeine Other (See Comments)    Makes feel funny    Current Outpatient Prescriptions on File Prior to Visit  Medication Sig Dispense Refill  . atorvastatin (LIPITOR) 10 MG tablet Take 1 tablet (10 mg total) by mouth daily. 90 tablet 1  . cetirizine (ZYRTEC) 10 MG tablet Take 1 tablet (10 mg total) by mouth daily as needed for allergies or rhinitis (cough). 90 tablet 1  . dorzolamide (TRUSOPT) 2 % ophthalmic solution Place 1 drop into both eyes 2 (two) times daily.     Marland Kitchen latanoprost (XALATAN) 0.005 % ophthalmic solution Place 1 drop into both eyes at bedtime.     . metoprolol succinate (TOPROL-XL) 50 MG 24 hr tablet Take 1 tablet (50 mg total) by mouth daily. Take with  or immediately following a meal. 90 tablet 2  . timolol (BETIMOL) 0.5 % ophthalmic solution Place 1 drop into both eyes 2 (two) times daily.     No current facility-administered medications on file prior to visit.     BP 125/70 (BP Location: Left Arm, Patient Position: Sitting, Cuff Size: Large)   Pulse 93   Temp 98.1 F (36.7 C) (Oral)   Resp 16   Ht 5\' 8"  (1.727 m)   Wt 187 lb (84.8 kg)   SpO2 96%   BMI 28.43 kg/m       Objective:   Physical Exam  General  Mental Status - Alert. General Appearance - Well groomed. Not in acute distress.  Skin Rashes- No Rashes.  HEENT Head- Normal. Ear Auditory Canal - Left- Normal. Right - Normal.Tympanic Membrane- Left- Normal. Right- Normal. Eye Sclera/Conjunctiva- Left- Normal. Right- Normal. Nose & Sinuses Nasal Mucosa- Left-  Not  Boggy and  Congested. Right-  No  Boggy and  Congested.Bilateral  No maxillary and no  frontal sinus pressure.  Symmetric appearance to lower half of nares. Possible slight deviated septum. I did not see any obvous significant polyp lower half of nares. Upper portion of nares not well visualized at all. Pt has little bit smaller sized nares in my opinion. Mouth & Throat Lips: Upper Lip- Normal: no dryness, cracking, pallor, cyanosis, or vesicular eruption. Lower Lip-Normal: no dryness, cracking, pallor, cyanosis or vesicular eruption. Buccal Mucosa- Bilateral- No Aphthous ulcers. Oropharynx- No Discharge or Erythema. Tonsils: Characteristics- Bilateral- No Erythema or Congestion. Size/Enlargement- Bilateral- No enlargement. Discharge- bilateral-None.  Neck Neck- Supple. No Masses.   Chest and Lung Exam Auscultation: Breath Sounds:-Clear even and unlabored.  Cardiovascular Auscultation:Rythm- Regular, rate and rhythm. Murmurs & Other Heart Sounds:Ausculatation of the heart reveal- No Murmurs.  Lymphatic Head & Neck General Head & Neck Lymphatics: Bilateral: Description- No Localized lymphadenopathy.       Assessment & Plan:  For your rt nasal obstructed feeling/possible congested will rx flonase to use daily. I can't see all the way back in nostril so need to refer you to ent as they have better equipment to get deep visualization.(possible high positioned polyp?)  I will write the ent referral and if no one calls you by Mid week next week then advise call here and speak with Anderson Malta for update on referral.  Follow up with our office in 2 weeks if delay in appointment or sooner  if symptoms worsen or change.

## 2016-08-29 NOTE — Telephone Encounter (Signed)
I think 175 is a good goal weight for her, gets her to a BMI around 26. Good at her age. Please write her a letter saying her goal weight should be 175#.

## 2016-08-30 ENCOUNTER — Encounter: Payer: Self-pay | Admitting: Family Medicine

## 2016-08-30 NOTE — Telephone Encounter (Signed)
Letter completed and mailed to the patient. Patient is aware

## 2016-09-27 DIAGNOSIS — J302 Other seasonal allergic rhinitis: Secondary | ICD-10-CM | POA: Diagnosis not present

## 2016-09-27 DIAGNOSIS — H9193 Unspecified hearing loss, bilateral: Secondary | ICD-10-CM | POA: Diagnosis not present

## 2016-09-27 DIAGNOSIS — J3489 Other specified disorders of nose and nasal sinuses: Secondary | ICD-10-CM | POA: Diagnosis not present

## 2016-09-27 DIAGNOSIS — J343 Hypertrophy of nasal turbinates: Secondary | ICD-10-CM | POA: Diagnosis not present

## 2016-10-14 DIAGNOSIS — H5210 Myopia, unspecified eye: Secondary | ICD-10-CM | POA: Diagnosis not present

## 2016-10-20 DIAGNOSIS — H26491 Other secondary cataract, right eye: Secondary | ICD-10-CM | POA: Diagnosis not present

## 2016-10-21 DIAGNOSIS — H26491 Other secondary cataract, right eye: Secondary | ICD-10-CM | POA: Diagnosis not present

## 2016-11-23 DIAGNOSIS — J343 Hypertrophy of nasal turbinates: Secondary | ICD-10-CM | POA: Diagnosis not present

## 2016-11-23 DIAGNOSIS — J302 Other seasonal allergic rhinitis: Secondary | ICD-10-CM | POA: Diagnosis not present

## 2016-11-23 DIAGNOSIS — J3489 Other specified disorders of nose and nasal sinuses: Secondary | ICD-10-CM | POA: Diagnosis not present

## 2016-12-30 NOTE — Progress Notes (Signed)
Subjective:   Jasmine Wilson is a 76 y.o. female who presents for Medicare Annual (Subsequent) preventive examination.  Review of Systems:  ROS No ROS.  Medicare Wellness Visit. Additional risk factors are reflected in the social history. Cardiac Risk Factors include: advanced age (>70men, >49 women);dyslipidemia;hypertension;sedentary lifestyle Sleep patterns:  Sleeps about 9 hrs per night. Feels rested. Naps daily.  Home Safety/Smoke Alarms: Feels safe in home. Smoke alarms in place.  Living environment; residence and Firearm Safety: Lives alone. No stairs. Guns safely stored. Seat Belt Safety/Bike Helmet: Wears seat belt.   Counseling:   Eye Exam- wearing glasses. Dr.Scott every 6 months. Dental- Every 6 months.  Female:   Pap-  Last 03/14/13-normal     Mammo-   Last 05/19/15:   BI-RADS CATEGORY  1: Negative. PT DECLINES Dexa scan-    PT DECLINES    CCS- last 04/23/11: Non-precancerous polyp removed. Recall 59yrs.    Objective:     Vitals: BP (!) 152/88 (BP Location: Right Wrist, Patient Position: Sitting, Cuff Size: Normal)   Pulse 79   Ht 5\' 8"  (1.727 m)   Wt 176 lb 12.8 oz (80.2 kg)   SpO2 98%   BMI 26.88 kg/m   Body mass index is 26.88 kg/m.   Tobacco History  Smoking Status  . Former Smoker  . Types: Cigarettes  . Quit date: 06/21/1970  Smokeless Tobacco  . Never Used    Comment: occasional smoker     Counseling given: No   Past Medical History:  Diagnosis Date  . Abdominal pain 03/02/2011  . Arthritis 03/02/2012  . Bronchitis 04/04/2011  . Cataract   . Chicken pox as a child  . Cough 01/29/2013  . Female bladder prolapse, acquired 01/29/2011  . Glaucoma 01/29/2011  . Hearing aid worn   . History of pneumonia   . HTN (hypertension) 01/29/2011  . Hyperlipidemia   . Measles as a child  . Meniere's disease   . Pedal edema 04/01/2016  . Preventative health care 01/29/2011  . Shingles   . Vaginosis 01/29/2013   Past Surgical History:  Procedure  Laterality Date  . lump removed on left breast     clogged milk duct, benign  . TUBAL LIGATION     Family History  Problem Relation Age of Onset  . Breast cancer Mother   . Stroke Father   . Heart disease Father   . Hyperlipidemia Father   . Hypertension Father   . Breast cancer Sister 72  . Uterine cancer Maternal Grandmother        uterus   History  Sexual Activity  . Sexual activity: No    Outpatient Encounter Prescriptions as of 01/04/2017  Medication Sig  . atorvastatin (LIPITOR) 10 MG tablet Take 1 tablet (10 mg total) by mouth daily.  . cetirizine (ZYRTEC) 10 MG tablet Take 1 tablet (10 mg total) by mouth daily as needed for allergies or rhinitis (cough).  . dorzolamide (TRUSOPT) 2 % ophthalmic solution Place 1 drop into both eyes 2 (two) times daily.   Javier Docker Oil 1000 MG CAPS Take by mouth daily.  Marland Kitchen latanoprost (XALATAN) 0.005 % ophthalmic solution Place 1 drop into both eyes at bedtime.   . metoprolol succinate (TOPROL-XL) 50 MG 24 hr tablet Take 1 tablet (50 mg total) by mouth daily. Take with or immediately following a meal.  . timolol (BETIMOL) 0.5 % ophthalmic solution Place 1 drop into both eyes 2 (two) times daily.  . fluticasone (FLONASE)  50 MCG/ACT nasal spray Place 2 sprays into both nostrils daily. (Patient not taking: Reported on 01/04/2017)   No facility-administered encounter medications on file as of 01/04/2017.     Activities of Daily Living In your present state of health, do you have any difficulty performing the following activities: 01/04/2017 04/01/2016  Hearing? Y N  Vision? N N  Difficulty concentrating or making decisions? N N  Walking or climbing stairs? N N  Dressing or bathing? N N  Doing errands, shopping? N N  Preparing Food and eating ? N -  Using the Toilet? N -  In the past six months, have you accidently leaked urine? Y -  Do you have problems with loss of bowel control? N -  Managing your Medications? N -  Managing your Finances?  N -  Housekeeping or managing your Housekeeping? N -  Some recent data might be hidden    Patient Care Team: [name], MD as PCP - General (Family Medicine)    Assessment:    Physical assessment deferred to PCP.  Exercise Activities and Dietary recommendations Current Exercise Habits: The patient does not participate in regular exercise at present, Exercise limited by: None identified   Diet (meal preparation, eat out, water intake, caffeinated beverages, dairy products, fruits and vegetables): in general, a "healthy" diet    ON WEIGHT WATCHERS  Goals      Patient Stated   . Improve balance with new exercise (pt-stated)      Fall Risk Fall Risk  01/04/2017 04/01/2016 10/01/2014 03/18/2013  Falls in the past year? No No No No   Depression Screen PHQ 2/9 Scores 01/04/2017 04/01/2016 10/01/2014 03/18/2013  PHQ - 2 Score 0 0 0 0     Cognitive Function MMSE - Mini Mental State Exam 01/04/2017  Orientation to time 5  Orientation to Place 5  Registration 3  Attention/ Calculation 5  Recall 2  Language- name 2 objects 2  Language- repeat 1  Language- follow 3 step command 3  Language- read & follow direction 1  Write a sentence 1  Copy design 1  Total score 29        Immunization History  Administered Date(s) Administered  . Pneumococcal Conjugate-13 05/06/2015  . Tdap 02/24/2012   Screening Tests Health Maintenance  Topic Date Due  . DEXA SCAN  02/16/2006  . PNA vac Low Risk Adult (2 of 2 - PPSV23) 05/05/2016  . INFLUENZA VACCINE  05/09/2017 (Originally 01/19/2017)  . COLONOSCOPY  04/22/2021  . TETANUS/TDAP  02/23/2022      Plan:   Follow up with Dr.Blyth as scheduled 04/04/17.  Continue to eat heart healthy diet (full of fruits, vegetables, whole grains, lean protein, water--limit salt, fat, and sugar intake) and increase physical activity as tolerated.  Continue doing brain stimulating activities (puzzles, reading, adult coloring books, staying active)  to keep memory sharp.    I have personally reviewed and noted the following in the patient's chart:   . Medical and social history . Use of alcohol, tobacco or illicit drugs  . Current medications and supplements . Functional ability and status . Nutritional status . Physical activity . Advanced directives . List of other physicians . Hospitalizations, surgeries, and ER visits in previous 12 months . Vitals . Screenings to include cognitive, depression, and falls . Referrals and appointments  In addition, I have reviewed and discussed with patient certain preventive protocols, quality metrics, and best practice recommendations. A written personalized care plan for preventive  services as well as general preventive health recommendations were provided to patient.     Naaman Plummer [place], [place]  01/04/2017

## 2017-01-04 ENCOUNTER — Ambulatory Visit (INDEPENDENT_AMBULATORY_CARE_PROVIDER_SITE_OTHER): Payer: Medicare HMO | Admitting: *Deleted

## 2017-01-04 ENCOUNTER — Encounter: Payer: Self-pay | Admitting: *Deleted

## 2017-01-04 VITALS — BP 152/88 | HR 79 | Ht 68.0 in | Wt 176.8 lb

## 2017-01-04 DIAGNOSIS — E785 Hyperlipidemia, unspecified: Secondary | ICD-10-CM | POA: Diagnosis not present

## 2017-01-04 DIAGNOSIS — I10 Essential (primary) hypertension: Secondary | ICD-10-CM

## 2017-01-04 DIAGNOSIS — Z0000 Encounter for general adult medical examination without abnormal findings: Secondary | ICD-10-CM

## 2017-01-04 LAB — COMPREHENSIVE METABOLIC PANEL
ALK PHOS: 86 U/L (ref 33–130)
ALT: 13 U/L (ref 6–29)
AST: 19 U/L (ref 10–35)
Albumin: 4.1 g/dL (ref 3.6–5.1)
BILIRUBIN TOTAL: 0.5 mg/dL (ref 0.2–1.2)
BUN: 10 mg/dL (ref 7–25)
CO2: 23 mmol/L (ref 20–31)
CREATININE: 0.83 mg/dL (ref 0.60–0.93)
Calcium: 9.2 mg/dL (ref 8.6–10.4)
Chloride: 104 mmol/L (ref 98–110)
Glucose, Bld: 81 mg/dL (ref 65–99)
Potassium: 4.6 mmol/L (ref 3.5–5.3)
SODIUM: 138 mmol/L (ref 135–146)
TOTAL PROTEIN: 6.2 g/dL (ref 6.1–8.1)

## 2017-01-04 LAB — LIPID PANEL
Cholesterol: 165 mg/dL (ref <200)
HDL: 62 mg/dL (ref >50)
LDL CALC: 60 mg/dL (ref <100)
TRIGLYCERIDES: 213 mg/dL — AB (ref <150)
Total CHOL/HDL Ratio: 2.7 Ratio (ref <5.0)
VLDL: 43 mg/dL — ABNORMAL HIGH (ref <30)

## 2017-01-04 NOTE — Patient Instructions (Addendum)
[name] , Thank you for taking time to come for your Medicare Wellness Visit. I appreciate your ongoing commitment to your health goals. Please review the following plan we discussed and let me know if I can assist you in the future.   These are the goals we discussed: Goals      Patient Stated   . Improve balance with new exercise (pt-stated)       This is a list of the screening recommended for you and due dates:  Health Maintenance  Topic Date Due  . DEXA scan (bone density measurement)  02/16/2006  . Pneumonia vaccines (2 of 2 - PPSV23) 05/05/2016  . Flu Shot  05/09/2017*  . Colon Cancer Screening  04/22/2021  . Tetanus Vaccine  02/23/2022  *Topic was postponed. The date shown is not the original due date.   Follow up with Dr.Blyth as scheduled 04/04/17.  Continue to eat heart healthy diet (full of fruits, vegetables, whole grains, lean protein, water--limit salt, fat, and sugar intake) and increase physical activity as tolerated.  Continue doing brain stimulating activities (puzzles, reading, adult coloring books, staying active) to keep memory sharp.    Health Maintenance for Postmenopausal Women Menopause is a normal process in which your reproductive ability comes to an end. This process happens gradually over a span of months to years, usually between the ages of 71 and 69. Menopause is complete when you have missed 12 consecutive menstrual periods. It is important to talk with your health care provider about some of the most common conditions that affect postmenopausal women, such as heart disease, cancer, and bone loss (osteoporosis). Adopting a healthy lifestyle and getting preventive care can help to promote your health and wellness. Those actions can also lower your chances of developing some of these common conditions. What should I know about menopause? During menopause, you may experience a number of symptoms, such as:  Moderate-to-severe hot flashes.  Night  sweats.  Decrease in sex drive.  Mood swings.  Headaches.  Tiredness.  Irritability.  Memory problems.  Insomnia.  Choosing to treat or not to treat menopausal changes is an individual decision that you make with your health care provider. What should I know about hormone replacement therapy and supplements? Hormone therapy products are effective for treating symptoms that are associated with menopause, such as hot flashes and night sweats. Hormone replacement carries certain risks, especially as you become older. If you are thinking about using estrogen or estrogen with progestin treatments, discuss the benefits and risks with your health care provider. What should I know about heart disease and stroke? Heart disease, heart attack, and stroke become more likely as you age. This may be due, in part, to the hormonal changes that your body experiences during menopause. These can affect how your body processes dietary fats, triglycerides, and cholesterol. Heart attack and stroke are both medical emergencies. There are many things that you can do to help prevent heart disease and stroke:  Have your blood pressure checked at least every 1-2 years. High blood pressure causes heart disease and increases the risk of stroke.  If you are 51-72 years old, ask your health care provider if you should take aspirin to prevent a heart attack or a stroke.  Do not use any tobacco products, including cigarettes, chewing tobacco, or electronic cigarettes. If you need help quitting, ask your health care provider.  It is important to eat a healthy diet and maintain a healthy weight. ? Be sure to  include plenty of vegetables, fruits, low-fat dairy products, and lean protein. ? Avoid eating foods that are high in solid fats, added sugars, or salt (sodium).  Get regular exercise. This is one of the most important things that you can do for your health. ? Try to exercise for at least 150 minutes each week.  The type of exercise that you do should increase your heart rate and make you sweat. This is known as moderate-intensity exercise. ? Try to do strengthening exercises at least twice each week. Do these in addition to the moderate-intensity exercise.  Know your numbers.Ask your health care provider to check your cholesterol and your blood glucose. Continue to have your blood tested as directed by your health care provider.  What should I know about cancer screening? There are several types of cancer. Take the following steps to reduce your risk and to catch any cancer development as early as possible. Breast Cancer  Practice breast self-awareness. ? This means understanding how your breasts normally appear and feel. ? It also means doing regular breast self-exams. Let your health care provider know about any changes, no matter how small.  If you are 73 or older, have a clinician do a breast exam (clinical breast exam or CBE) every year. Depending on your age, family history, and medical history, it may be recommended that you also have a yearly breast X-ray (mammogram).  If you have a family history of breast cancer, talk with your health care provider about genetic screening.  If you are at high risk for breast cancer, talk with your health care provider about having an MRI and a mammogram every year.  Breast cancer (BRCA) gene test is recommended for women who have family members with BRCA-related cancers. Results of the assessment will determine the need for genetic counseling and BRCA1 and for BRCA2 testing. BRCA-related cancers include these types: ? Breast. This occurs in males or females. ? Ovarian. ? Tubal. This may also be called fallopian tube cancer. ? Cancer of the abdominal or pelvic lining (peritoneal cancer). ? Prostate. ? Pancreatic.  Cervical, Uterine, and Ovarian Cancer Your health care provider may recommend that you be screened regularly for cancer of the pelvic  organs. These include your ovaries, uterus, and vagina. This screening involves a pelvic exam, which includes checking for microscopic changes to the surface of your cervix (Pap test).  For women ages 21-65, health care providers may recommend a pelvic exam and a Pap test every three years. For women ages 74-65, they may recommend the Pap test and pelvic exam, combined with testing for human papilloma virus (HPV), every five years. Some types of HPV increase your risk of cervical cancer. Testing for HPV may also be done on women of any age who have unclear Pap test results.  Other health care providers may not recommend any screening for nonpregnant women who are considered low risk for pelvic cancer and have no symptoms. Ask your health care provider if a screening pelvic exam is right for you.  If you have had past treatment for cervical cancer or a condition that could lead to cancer, you need Pap tests and screening for cancer for at least 20 years after your treatment. If Pap tests have been discontinued for you, your risk factors (such as having a new sexual partner) need to be reassessed to determine if you should start having screenings again. Some women have medical problems that increase the chance of getting cervical cancer. In these cases,  your health care provider may recommend that you have screening and Pap tests more often.  If you have a family history of uterine cancer or ovarian cancer, talk with your health care provider about genetic screening.  If you have vaginal bleeding after reaching menopause, tell your health care provider.  There are currently no reliable tests available to screen for ovarian cancer.  Lung Cancer Lung cancer screening is recommended for adults 55-80 years old who are at high risk for lung cancer because of a history of smoking. A yearly low-dose CT scan of the lungs is recommended if you:  Currently smoke.  Have a history of at least 30 pack-years of  smoking and you currently smoke or have quit within the past 15 years. A pack-year is smoking an average of one pack of cigarettes per day for one year.  Yearly screening should:  Continue until it has been 15 years since you quit.  Stop if you develop a health problem that would prevent you from having lung cancer treatment.  Colorectal Cancer  This type of cancer can be detected and can often be prevented.  Routine colorectal cancer screening usually begins at age 50 and continues through age 75.  If you have risk factors for colon cancer, your health care provider may recommend that you be screened at an earlier age.  If you have a family history of colorectal cancer, talk with your health care provider about genetic screening.  Your health care provider may also recommend using home test kits to check for hidden blood in your stool.  A small camera at the end of a tube can be used to examine your colon directly (sigmoidoscopy or colonoscopy). This is done to check for the earliest forms of colorectal cancer.  Direct examination of the colon should be repeated every 5-10 years until age 75. However, if early forms of precancerous polyps or small growths are found or if you have a family history or genetic risk for colorectal cancer, you may need to be screened more often.  Skin Cancer  Check your skin from head to toe regularly.  Monitor any moles. Be sure to tell your health care provider: ? About any new moles or changes in moles, especially if there is a change in a mole's shape or color. ? If you have a mole that is larger than the size of a pencil eraser.  If any of your family members has a history of skin cancer, especially at a young age, talk with your health care provider about genetic screening.  Always use sunscreen. Apply sunscreen liberally and repeatedly throughout the day.  Whenever you are outside, protect yourself by wearing long sleeves, pants, a wide-brimmed  hat, and sunglasses.  What should I know about osteoporosis? Osteoporosis is a condition in which bone destruction happens more quickly than new bone creation. After menopause, you may be at an increased risk for osteoporosis. To help prevent osteoporosis or the bone fractures that can happen because of osteoporosis, the following is recommended:  If you are 19-50 years old, get at least 1,000 mg of calcium and at least 600 mg of vitamin D per day.  If you are older than age 50 but younger than age 70, get at least 1,200 mg of calcium and at least 600 mg of vitamin D per day.  If you are older than age 70, get at least 1,200 mg of calcium and at least 800 mg of vitamin D per day.    Smoking and excessive alcohol intake increase the risk of osteoporosis. Eat foods that are rich in calcium and vitamin D, and do weight-bearing exercises several times each week as directed by your health care provider. What should I know about how menopause affects my mental health? Depression may occur at any age, but it is more common as you become older. Common symptoms of depression include:  Low or sad mood.  Changes in sleep patterns.  Changes in appetite or eating patterns.  Feeling an overall lack of motivation or enjoyment of activities that you previously enjoyed.  Frequent crying spells.  Talk with your health care provider if you think that you are experiencing depression. What should I know about immunizations? It is important that you get and maintain your immunizations. These include:  Tetanus, diphtheria, and pertussis (Tdap) booster vaccine.  Influenza every year before the flu season begins.  Pneumonia vaccine.  Shingles vaccine.  Your health care provider may also recommend other immunizations. This information is not intended to replace advice given to you by your health care provider. Make sure you discuss any questions you have with your health care provider. Document Released:  07/30/2005 Document Revised: 12/26/2015 Document Reviewed: 03/11/2015 Elsevier Interactive Patient Education  2018 [employer].

## 2017-01-05 DIAGNOSIS — Z0100 Encounter for examination of eyes and vision without abnormal findings: Secondary | ICD-10-CM | POA: Diagnosis not present

## 2017-02-01 ENCOUNTER — Other Ambulatory Visit: Payer: Self-pay | Admitting: Family Medicine

## 2017-04-04 ENCOUNTER — Encounter: Payer: Self-pay | Admitting: Family Medicine

## 2017-04-04 ENCOUNTER — Ambulatory Visit (INDEPENDENT_AMBULATORY_CARE_PROVIDER_SITE_OTHER): Payer: Medicare HMO | Admitting: Family Medicine

## 2017-04-04 VITALS — BP 156/84 | HR 75 | Temp 98.1°F | Resp 18 | Ht 68.0 in | Wt 176.2 lb

## 2017-04-04 DIAGNOSIS — H8109 Meniere's disease, unspecified ear: Secondary | ICD-10-CM | POA: Diagnosis not present

## 2017-04-04 DIAGNOSIS — E782 Mixed hyperlipidemia: Secondary | ICD-10-CM

## 2017-04-04 DIAGNOSIS — Z0000 Encounter for general adult medical examination without abnormal findings: Secondary | ICD-10-CM

## 2017-04-04 DIAGNOSIS — I10 Essential (primary) hypertension: Secondary | ICD-10-CM

## 2017-04-04 MED ORDER — METOPROLOL SUCCINATE ER 100 MG PO TB24: 100.0000 mg | ORAL_TABLET | Freq: Every day | ORAL | 1 refills | Status: DC

## 2017-04-04 MED ORDER — ATORVASTATIN CALCIUM 10 MG PO TABS
10.0000 mg | ORAL_TABLET | Freq: Every day | ORAL | 1 refills | Status: DC
Start: 2017-04-04 — End: 2017-11-07

## 2017-04-04 MED ORDER — METOPROLOL SUCCINATE ER 50 MG PO TB24: 50.0000 mg | ORAL_TABLET | Freq: Every day | ORAL | 2 refills | Status: DC

## 2017-04-04 NOTE — Assessment & Plan Note (Addendum)
Poorly controlled, increase Metoprolol ER 100 mg tabs, 1 tab po daily. Encouraged heart healthy diet such as the DASH diet and exercise as tolerated.

## 2017-04-04 NOTE — Progress Notes (Signed)
Subjective:  I acted as a Education administrator for Dr. Charlett Blake. [name], [place]  Patient ID: Jasmine Wilson, female    DOB: 05/05/1941, 76 y.o.   MRN: [id number]  No chief complaint on file.   HPI  Patient is in today for an annual exam and follow up on chronic medical conditions such as Meniere's disease, hypertension and hyperlipidemai. She feels well, no recent febrile illness or hospitalizations. She is staying active and maintaining a heart healthy diet. She is doing well with her ADLs. Denies CP/palp/SOB/HA/congestion/fevers/GI or GU c/o. Taking meds as prescribed  Patient Care Team: [name], MD as PCP - General (Family Medicine)   Past Medical History:  Diagnosis Date  . Abdominal pain 03/02/2011  . Arthritis 03/02/2012  . Bronchitis 04/04/2011  . Cataract   . Chicken pox as a child  . Cough 01/29/2013  . Female bladder prolapse, acquired 01/29/2011  . Glaucoma 01/29/2011  . Hearing aid worn   . History of pneumonia   . HTN (hypertension) 01/29/2011  . Hyperlipidemia   . Measles as a child  . Meniere's disease   . Pedal edema 04/01/2016  . Preventative health care 01/29/2011  . Shingles   . Vaginosis 01/29/2013    Past Surgical History:  Procedure Laterality Date  . lump removed on left breast     clogged milk duct, benign  . TUBAL LIGATION      Family History  Problem Relation Age of Onset  . Breast cancer Mother   . Stroke Father   . Heart disease Father   . Hyperlipidemia Father   . Hypertension Father   . Breast cancer Sister 36  . Uterine cancer Maternal Grandmother        uterus    Social History   Social History  . Marital status: Widowed    Spouse name: N/A  . Number of children: 3  . Years of education: N/A   Occupational History  . Retired    Social History Main Topics  . Smoking status: Former Smoker    Types: Cigarettes    Quit date: 06/21/1970  . Smokeless tobacco: Never Used     Comment: occasional smoker  . Alcohol use No  . Drug use: No  .  Sexual activity: No   Other Topics Concern  . Not on file   Social History Narrative  . No narrative on file    Outpatient Medications Prior to Visit  Medication Sig Dispense Refill  . cetirizine (ZYRTEC) 10 MG tablet Take 1 tablet (10 mg total) by mouth daily as needed for allergies or rhinitis (cough). 90 tablet 1  . dorzolamide (TRUSOPT) 2 % ophthalmic solution Place 1 drop into both eyes 2 (two) times daily.     Marland Kitchen latanoprost (XALATAN) 0.005 % ophthalmic solution Place 1 drop into both eyes at bedtime.     . timolol (BETIMOL) 0.5 % ophthalmic solution Place 1 drop into both eyes 2 (two) times daily.    Marland Kitchen atorvastatin (LIPITOR) 10 MG tablet TAKE 1 TABLET EVERY DAY 90 tablet 1  . metoprolol succinate (TOPROL-XL) 50 MG 24 hr tablet Take 1 tablet (50 mg total) by mouth daily. Take with or immediately following a meal. 90 tablet 2  . fluticasone (FLONASE) 50 MCG/ACT nasal spray Place 2 sprays into both nostrils daily. (Patient not taking: Reported on 01/04/2017) 16 g 1  . Krill Oil 1000 MG CAPS Take by mouth daily.     No facility-administered medications prior to visit.  Allergies  Allergen Reactions  . Codeine Other (See Comments)    Makes feel funny    Review of Systems  Constitutional: Negative for chills, fever and malaise/fatigue.  HENT: Negative for congestion and hearing loss.   Eyes: Negative for discharge.  Respiratory: Negative for cough, sputum production and shortness of breath.   Cardiovascular: Negative for chest pain, palpitations and leg swelling.  Gastrointestinal: Negative for abdominal pain, blood in stool, constipation, diarrhea, heartburn, nausea and vomiting.  Genitourinary: Negative for dysuria, frequency, hematuria and urgency.  Musculoskeletal: Positive for falls. Negative for back pain and myalgias.  Skin: Negative for rash.  Neurological: Positive for dizziness. Negative for sensory change, loss of consciousness, weakness and headaches.    Endo/Heme/Allergies: Negative for environmental allergies. Does not bruise/bleed easily.  Psychiatric/Behavioral: Negative for depression and suicidal ideas. The patient is not nervous/anxious and does not have insomnia.        Objective:    Physical Exam  Constitutional: She is oriented to person, place, and time. She appears well-developed and well-nourished. No distress.  HENT:  Head: Normocephalic and atraumatic.  Eyes: Conjunctivae are normal.  Neck: Neck supple. No thyromegaly present.  Cardiovascular: Normal rate, regular rhythm and normal heart sounds.   Pulmonary/Chest: Effort normal and breath sounds normal. No respiratory distress.  Abdominal: Soft. Bowel sounds are normal. She exhibits no distension and no mass. There is no tenderness.  Musculoskeletal: She exhibits no edema.  Lymphadenopathy:    She has no cervical adenopathy.  Neurological: She is alert and oriented to person, place, and time.  Skin: Skin is warm and dry.  Psychiatric: She has a normal mood and affect. Her behavior is normal.    BP (!) 156/84 (BP Location: Left Arm, Patient Position: Sitting, Cuff Size: Normal)   Pulse 75   Temp 98.1 F (36.7 C) (Oral)   Resp 18   Ht 5\' 8"  (1.727 m)   Wt 176 lb 3.2 oz (79.9 kg)   SpO2 100%   BMI 26.79 kg/m  Wt Readings from Last 3 Encounters:  04/04/17 176 lb 3.2 oz (79.9 kg)  01/04/17 176 lb 12.8 oz (80.2 kg)  08/27/16 187 lb (84.8 kg)   BP Readings from Last 3 Encounters:  04/04/17 (!) 156/84  01/04/17 (!) 152/88  08/27/16 125/70     Immunization History  Administered Date(s) Administered  . Pneumococcal Conjugate-13 05/06/2015  . Tdap 02/24/2012    Health Maintenance  Topic Date Due  . DEXA SCAN  02/16/2006  . PNA vac Low Risk Adult (2 of 2 - PPSV23) 05/05/2016  . INFLUENZA VACCINE  05/09/2017 (Originally 01/19/2017)  . TETANUS/TDAP  02/23/2022    Lab Results  Component Value Date   WBC 7.4 04/01/2016   HGB 15.2 (H) 04/01/2016   HCT 45.6  04/01/2016   PLT 277.0 04/01/2016   GLUCOSE 81 01/04/2017   CHOL 165 01/04/2017   TRIG 213 (H) 01/04/2017   HDL 62 01/04/2017   LDLDIRECT 82.0 04/01/2016   LDLCALC 60 01/04/2017   ALT 13 01/04/2017   AST 19 01/04/2017   NA 138 01/04/2017   K 4.6 01/04/2017   CL 104 01/04/2017   CREATININE 0.83 01/04/2017   BUN 10 01/04/2017   CO2 23 01/04/2017   TSH 2.30 04/01/2016    Lab Results  Component Value Date   TSH 2.30 04/01/2016   Lab Results  Component Value Date   WBC 7.4 04/01/2016   HGB 15.2 (H) 04/01/2016   HCT 45.6 04/01/2016  MCV 88.8 04/01/2016   PLT 277.0 04/01/2016   Lab Results  Component Value Date   NA 138 01/04/2017   K 4.6 01/04/2017   CO2 23 01/04/2017   GLUCOSE 81 01/04/2017   BUN 10 01/04/2017   CREATININE 0.83 01/04/2017   BILITOT 0.5 01/04/2017   ALKPHOS 86 01/04/2017   AST 19 01/04/2017   ALT 13 01/04/2017   PROT 6.2 01/04/2017   ALBUMIN 4.1 01/04/2017   CALCIUM 9.2 01/04/2017   GFR 71.21 04/01/2016   Lab Results  Component Value Date   CHOL 165 01/04/2017   Lab Results  Component Value Date   HDL 62 01/04/2017   Lab Results  Component Value Date   LDLCALC 60 01/04/2017   Lab Results  Component Value Date   TRIG 213 (H) 01/04/2017   Lab Results  Component Value Date   CHOLHDL 2.7 01/04/2017   No results found for: HGBA1C       Assessment & Plan:   Problem List Items Addressed This Visit    Hyperlipidemia    Tolerating statin, encouraged heart healthy diet, avoid trans fats, minimize simple carbs and saturated fats. Increase exercise as tolerated      Relevant Medications   atorvastatin (LIPITOR) 10 MG tablet   metoprolol succinate (TOPROL-XL) 100 MG 24 hr tablet   Meniere's disease    Does have troube with dizziness at times and had a recent fall but for now declines referral to physical therapy      Preventative health care    She declines colonoscopy and Dexa scans. Patient encouraged to maintain heart healthy  diet, regular exercise, adequate sleep. Consider daily probiotics. Take medications as prescribed. Given and reviewed copy of ACP documents from [employer] and encouraged to complete and return. Labs reviewed.      HTN (hypertension)    Poorly controlled, increase Metoprolol ER 100 mg tabs, 1 tab po daily. Encouraged heart healthy diet such as the DASH diet and exercise as tolerated.       Relevant Medications   atorvastatin (LIPITOR) 10 MG tablet   metoprolol succinate (TOPROL-XL) 100 MG 24 hr tablet      I have discontinued Ms. Mcquarrie's Krill Oil and fluticasone. I have also changed her atorvastatin. Additionally, I am having her maintain her latanoprost, dorzolamide, timolol, cetirizine, and metoprolol succinate.  Meds ordered this encounter  Medications  . atorvastatin (LIPITOR) 10 MG tablet    Sig: Take 1 tablet (10 mg total) by mouth daily.    Dispense:  90 tablet    Refill:  1  . DISCONTD: metoprolol succinate (TOPROL-XL) 50 MG 24 hr tablet    Sig: Take 1 tablet (50 mg total) by mouth daily.    Dispense:  90 tablet    Refill:  2  . DISCONTD: metoprolol succinate (TOPROL-XL) 100 MG 24 hr tablet    Sig: Take 1 tablet (100 mg total) by mouth daily. Take with or immediately following a meal.    Dispense:  90 tablet    Refill:  1  . metoprolol succinate (TOPROL-XL) 100 MG 24 hr tablet    Sig: Take 1 tablet (100 mg total) by mouth daily. Take with or immediately following a meal.    Dispense:  90 tablet    Refill:  1    CMA served as scribe during this visit. History, Physical and Plan performed by medical provider. Documentation and orders reviewed and attested to.  Penni Homans, MD

## 2017-04-04 NOTE — Assessment & Plan Note (Signed)
Tolerating statin, encouraged heart healthy diet, avoid trans fats, minimize simple carbs and saturated fats. Increase exercise as tolerated 

## 2017-04-04 NOTE — Patient Instructions (Signed)
Shingrix is the new shingles shot, 2 shots over 2-6 months. Can get at the pharmacy Preventive Care 65 Years and Older, Female Preventive care refers to lifestyle choices and visits with your health care provider that can promote health and wellness. What does preventive care include?  A yearly physical exam. This is also called an annual well check.  Dental exams once or twice a year.  Routine eye exams. Ask your health care provider how often you should have your eyes checked.  Personal lifestyle choices, including: ? Daily care of your teeth and gums. ? Regular physical activity. ? Eating a healthy diet. ? Avoiding tobacco and drug use. ? Limiting alcohol use. ? Practicing safe sex. ? Taking low-dose aspirin every day. ? Taking vitamin and mineral supplements as recommended by your health care provider. What happens during an annual well check? The services and screenings done by your health care provider during your annual well check will depend on your age, overall health, lifestyle risk factors, and family history of disease. Counseling Your health care provider may ask you questions about your:  Alcohol use.  Tobacco use.  Drug use.  Emotional well-being.  Home and relationship well-being.  Sexual activity.  Eating habits.  History of falls.  Memory and ability to understand (cognition).  Work and work Statistician.  Reproductive health.  Screening You may have the following tests or measurements:  Height, weight, and BMI.  Blood pressure.  Lipid and cholesterol levels. These may be checked every 5 years, or more frequently if you are over 54 years old.  Skin check.  Lung cancer screening. You may have this screening every year starting at age 72 if you have a 30-pack-year history of smoking and currently smoke or have quit within the past 15 years.  Fecal occult blood test (FOBT) of the stool. You may have this test every year starting at age  67.  Flexible sigmoidoscopy or colonoscopy. You may have a sigmoidoscopy every 5 years or a colonoscopy every 10 years starting at age 92.  Hepatitis C blood test.  Hepatitis B blood test.  Sexually transmitted disease (STD) testing.  Diabetes screening. This is done by checking your blood sugar (glucose) after you have not eaten for a while (fasting). You may have this done every 1-3 years.  Bone density scan. This is done to screen for osteoporosis. You may have this done starting at age 55.  Mammogram. This may be done every 1-2 years. Talk to your health care provider about how often you should have regular mammograms.  Talk with your health care provider about your test results, treatment options, and if necessary, the need for more tests. Vaccines Your health care provider may recommend certain vaccines, such as:  Influenza vaccine. This is recommended every year.  Tetanus, diphtheria, and acellular pertussis (Tdap, Td) vaccine. You may need a Td booster every 10 years.  Varicella vaccine. You may need this if you have not been vaccinated.  Zoster vaccine. You may need this after age 24.  Measles, mumps, and rubella (MMR) vaccine. You may need at least one dose of MMR if you were born in 1957 or later. You may also need a second dose.  Pneumococcal 13-valent conjugate (PCV13) vaccine. One dose is recommended after age 52.  Pneumococcal polysaccharide (PPSV23) vaccine. One dose is recommended after age 36.  Meningococcal vaccine. You may need this if you have certain conditions.  Hepatitis A vaccine. You may need this if you have certain  conditions or if you travel or work in places where you may be exposed to hepatitis A.  Hepatitis B vaccine. You may need this if you have certain conditions or if you travel or work in places where you may be exposed to hepatitis B.  Haemophilus influenzae type b (Hib) vaccine. You may need this if you have certain conditions.  Talk to  your health care provider about which screenings and vaccines you need and how often you need them. This information is not intended to replace advice given to you by your health care provider. Make sure you discuss any questions you have with your health care provider. Document Released: 07/04/2015 Document Revised: 02/25/2016 Document Reviewed: 04/08/2015 Elsevier Interactive Patient Education  2017 [employer].

## 2017-04-04 NOTE — Assessment & Plan Note (Signed)
Does have troube with dizziness at times and had a recent fall but for now declines referral to physical therapy

## 2017-04-04 NOTE — Assessment & Plan Note (Signed)
She declines colonoscopy and Dexa scans. Patient encouraged to maintain heart healthy diet, regular exercise, adequate sleep. Consider daily probiotics. Take medications as prescribed. Given and reviewed copy of ACP documents from [employer] and encouraged to complete and return. Labs reviewed.

## 2017-04-22 ENCOUNTER — Telehealth: Payer: Self-pay | Admitting: Family Medicine

## 2017-04-22 MED ORDER — METOPROLOL SUCCINATE ER 100 MG PO TB24
100.0000 mg | ORAL_TABLET | Freq: Every day | ORAL | 1 refills | Status: DC
Start: 2017-04-22 — End: 2017-05-11

## 2017-04-22 NOTE — Telephone Encounter (Signed)
[facility]  Received order for metoprolol succinate  100mg  tab, on the same day they received a 50 mg tab, which do they need to dispense? [place]

## 2017-04-22 NOTE — Telephone Encounter (Signed)
rx sent to pharmacy

## 2017-04-24 NOTE — Telephone Encounter (Signed)
Thanks

## 2017-05-05 ENCOUNTER — Ambulatory Visit: Payer: Medicare HMO

## 2017-05-11 ENCOUNTER — Other Ambulatory Visit: Payer: Self-pay

## 2017-05-11 MED ORDER — METOPROLOL SUCCINATE ER 100 MG PO TB24: 100.0000 mg | ORAL_TABLET | Freq: Every day | ORAL | 1 refills | Status: DC

## 2017-05-18 ENCOUNTER — Ambulatory Visit (INDEPENDENT_AMBULATORY_CARE_PROVIDER_SITE_OTHER): Payer: Medicare HMO | Admitting: Family Medicine

## 2017-05-18 ENCOUNTER — Encounter: Payer: Self-pay | Admitting: Family Medicine

## 2017-05-18 VITALS — BP 128/82 | HR 76

## 2017-05-18 DIAGNOSIS — I10 Essential (primary) hypertension: Secondary | ICD-10-CM | POA: Diagnosis not present

## 2017-05-18 NOTE — Progress Notes (Addendum)
Pre visit review using our clinic tool,if applicable. No additional management support is needed unless otherwise documented below in the visit note.   Patient in for BP check per order from Dr. Charlett Blake dated   04/04/17. BP on this day was 156/84 Pulse was 75.  No complaints voiced today by patient other than needing Metoprolol called in to pharmacy because she has not received hers ordered from her insurance pharmacy. Called in 2 week supply to [facility]. [facility]) states they mailed out medications on 05/17/17.  Patients BP today =128/82 P = 76  Per Dr. Lorelei Pont DOD, patient to continue to take medications as ordered and return for regularly scheduled appointment with Dr. Charlett Blake. Patient agreed..    I have read and agree- J Copland

## 2017-05-18 NOTE — Telephone Encounter (Signed)
Patient in for BP check today. States she is having problems getting Metoprolol from [facility]. Called in Metoprolol 100 mg #14 to [facility].  Called [facility] states they mailed out patients medication on 05/17/17 (yesterday). Patient notified.

## 2017-09-30 DIAGNOSIS — H5210 Myopia, unspecified eye: Secondary | ICD-10-CM | POA: Diagnosis not present

## 2017-10-03 ENCOUNTER — Ambulatory Visit (HOSPITAL_BASED_OUTPATIENT_CLINIC_OR_DEPARTMENT_OTHER)
Admission: RE | Admit: 2017-10-03 | Discharge: 2017-10-03 | Disposition: A | Discharge status: Home / Self Care | Payer: Medicare HMO | Source: Ambulatory Visit | Attending: Family Medicine | Admitting: Family Medicine

## 2017-10-03 ENCOUNTER — Ambulatory Visit (INDEPENDENT_AMBULATORY_CARE_PROVIDER_SITE_OTHER): Payer: Medicare HMO | Admitting: Family Medicine

## 2017-10-03 ENCOUNTER — Encounter: Payer: Self-pay | Admitting: Family Medicine

## 2017-10-03 VITALS — BP 130/90 | HR 79 | Temp 97.7°F | Resp 18 | Wt 178.8 lb

## 2017-10-03 DIAGNOSIS — Z9109 Other allergy status, other than to drugs and biological substances: Secondary | ICD-10-CM

## 2017-10-03 DIAGNOSIS — M25551 Pain in right hip: Secondary | ICD-10-CM | POA: Insufficient documentation

## 2017-10-03 DIAGNOSIS — I10 Essential (primary) hypertension: Secondary | ICD-10-CM

## 2017-10-03 DIAGNOSIS — E782 Mixed hyperlipidemia: Secondary | ICD-10-CM

## 2017-10-03 DIAGNOSIS — R05 Cough: Secondary | ICD-10-CM

## 2017-10-03 DIAGNOSIS — R059 Cough, unspecified: Secondary | ICD-10-CM

## 2017-10-03 DIAGNOSIS — M1990 Unspecified osteoarthritis, unspecified site: Secondary | ICD-10-CM

## 2017-10-03 LAB — LIPID PANEL
Cholesterol: 158 mg/dL (ref 0–200)
HDL: 56.1 mg/dL (ref >39.00)
LDL Cholesterol: 66 mg/dL (ref 0–99)
NONHDL: 102.09
Total CHOL/HDL Ratio: 3
Triglycerides: 182 mg/dL — ABNORMAL HIGH (ref 0.0–149.0)
VLDL: 36.4 mg/dL (ref 0.0–40.0)

## 2017-10-03 LAB — CBC
HCT: 45.8 % (ref 36.0–46.0)
Hemoglobin: 15.3 g/dL — ABNORMAL HIGH (ref 12.0–15.0)
MCHC: 33.4 g/dL (ref 30.0–36.0)
MCV: 89.6 fl (ref 78.0–100.0)
Platelets: 320 10*3/uL (ref 150.0–400.0)
RBC: 5.11 Mil/uL (ref 3.87–5.11)
RDW: 14.1 % (ref 11.5–15.5)
WBC: 9 10*3/uL (ref 4.0–10.5)

## 2017-10-03 LAB — COMPREHENSIVE METABOLIC PANEL
ALK PHOS: 87 U/L (ref 39–117)
ALT: 12 U/L (ref 0–35)
AST: 15 U/L (ref 0–37)
Albumin: 4.3 g/dL (ref 3.5–5.2)
BILIRUBIN TOTAL: 0.5 mg/dL (ref 0.2–1.2)
BUN: 18 mg/dL (ref 6–23)
CO2: 28 mEq/L (ref 19–32)
CREATININE: 0.77 mg/dL (ref 0.40–1.20)
Calcium: 10.2 mg/dL (ref 8.4–10.5)
Chloride: 101 mEq/L (ref 96–112)
GFR: 77.34 mL/min (ref >60.00)
GLUCOSE: 94 mg/dL (ref 70–99)
Potassium: 5.1 mEq/L (ref 3.5–5.1)
SODIUM: 137 meq/L (ref 135–145)
TOTAL PROTEIN: 7.2 g/dL (ref 6.0–8.3)

## 2017-10-03 LAB — TSH: TSH: 1.49 u[IU]/mL (ref 0.35–4.50)

## 2017-10-03 IMAGING — DX DG HIP (WITH OR WITHOUT PELVIS) 2V BILAT
5 series · 5 of 5 positions shown · non-contrast
Comparison: None.

CLINICAL DATA: Right hip pain while walking

EXAM:
DG HIP (WITH OR WITHOUT PELVIS) 2V BILAT

[pelvis ap]
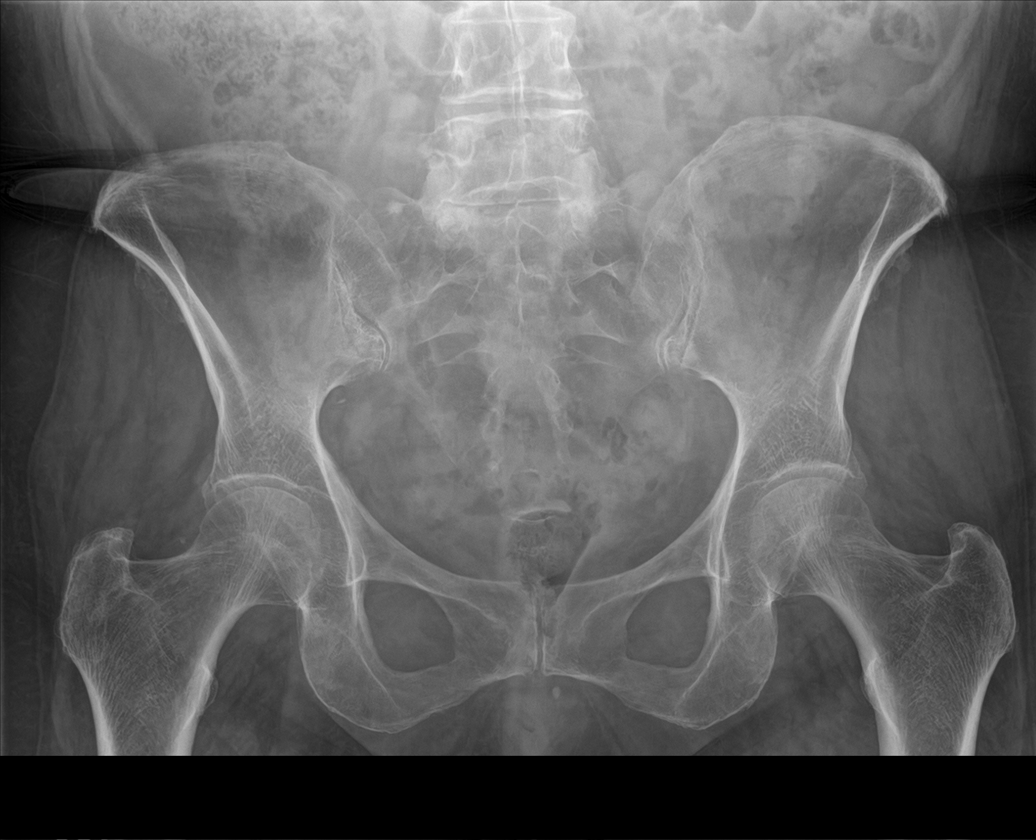

[hip ap (1 of 2)]
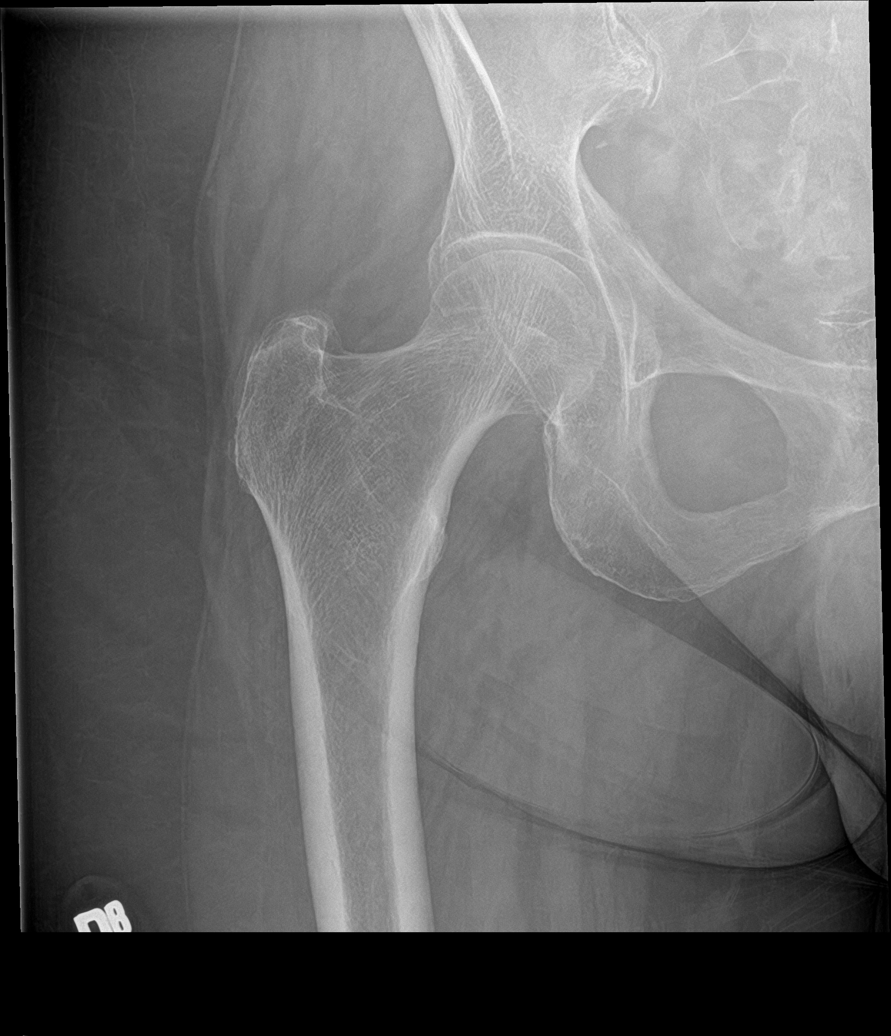

[hip lat (1 of 2)]
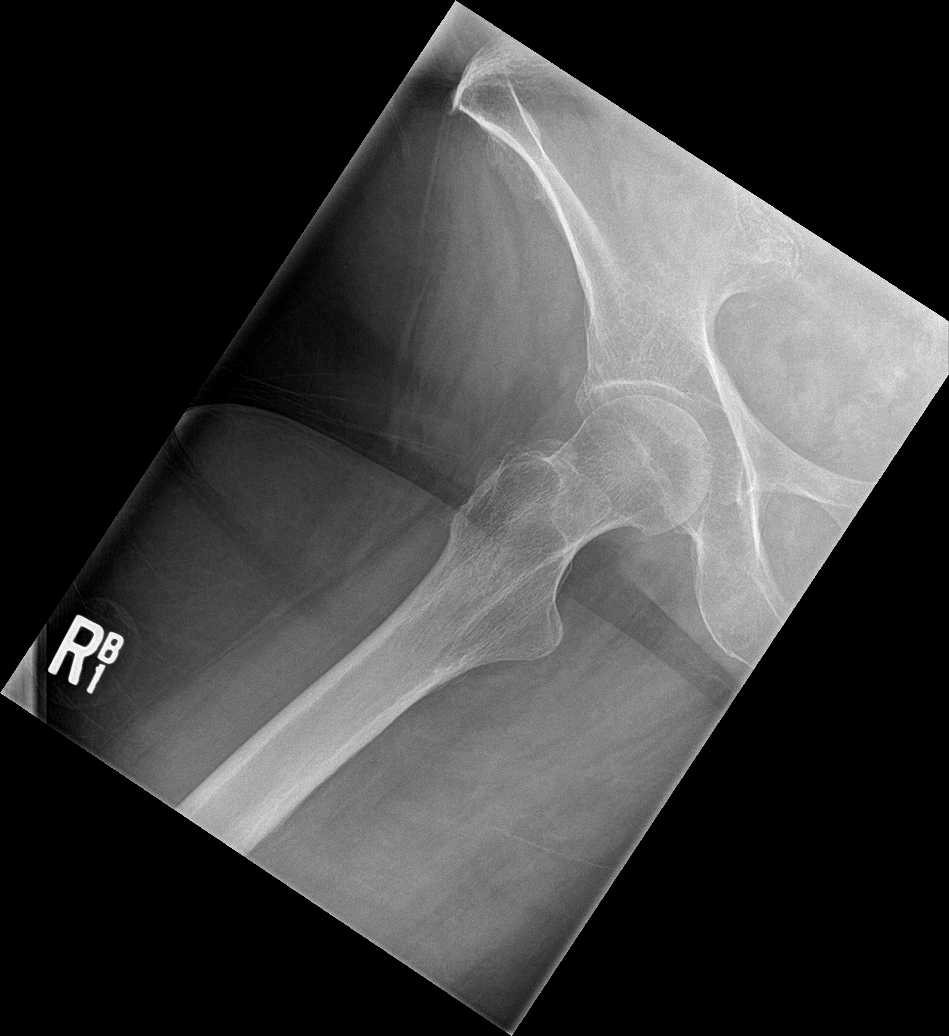

[hip ap (2 of 2)]
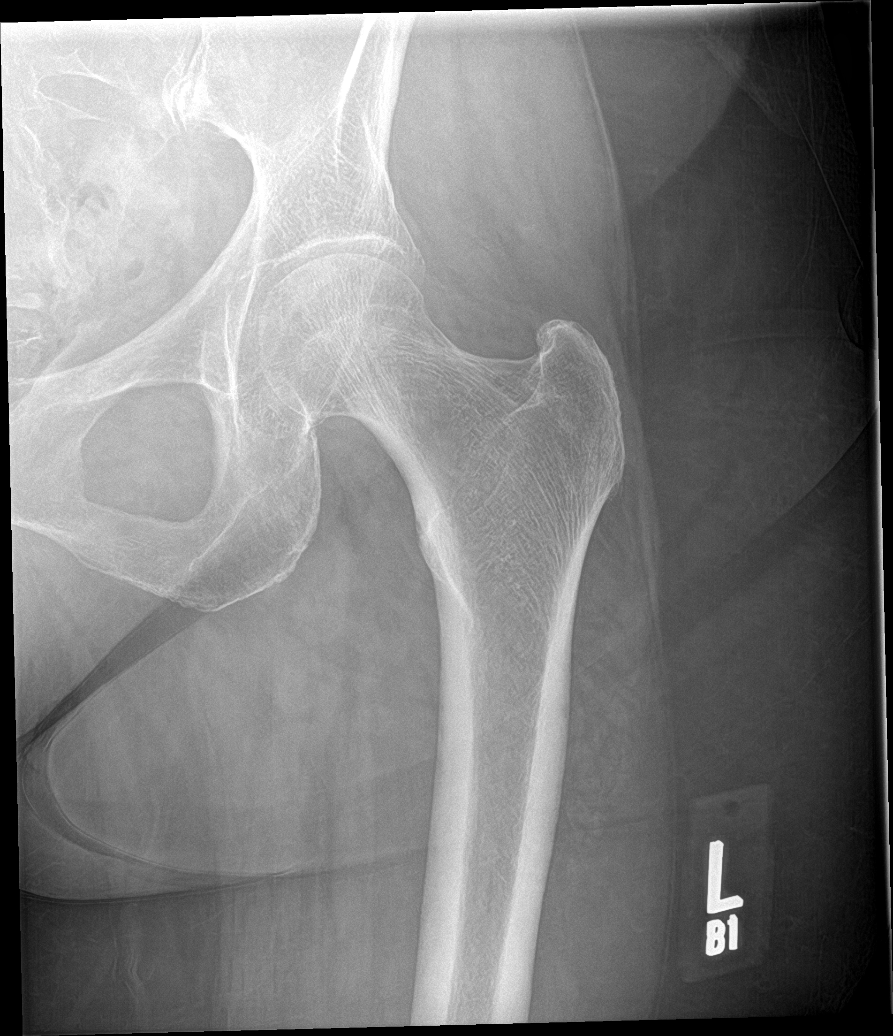

[hip lat (2 of 2)]
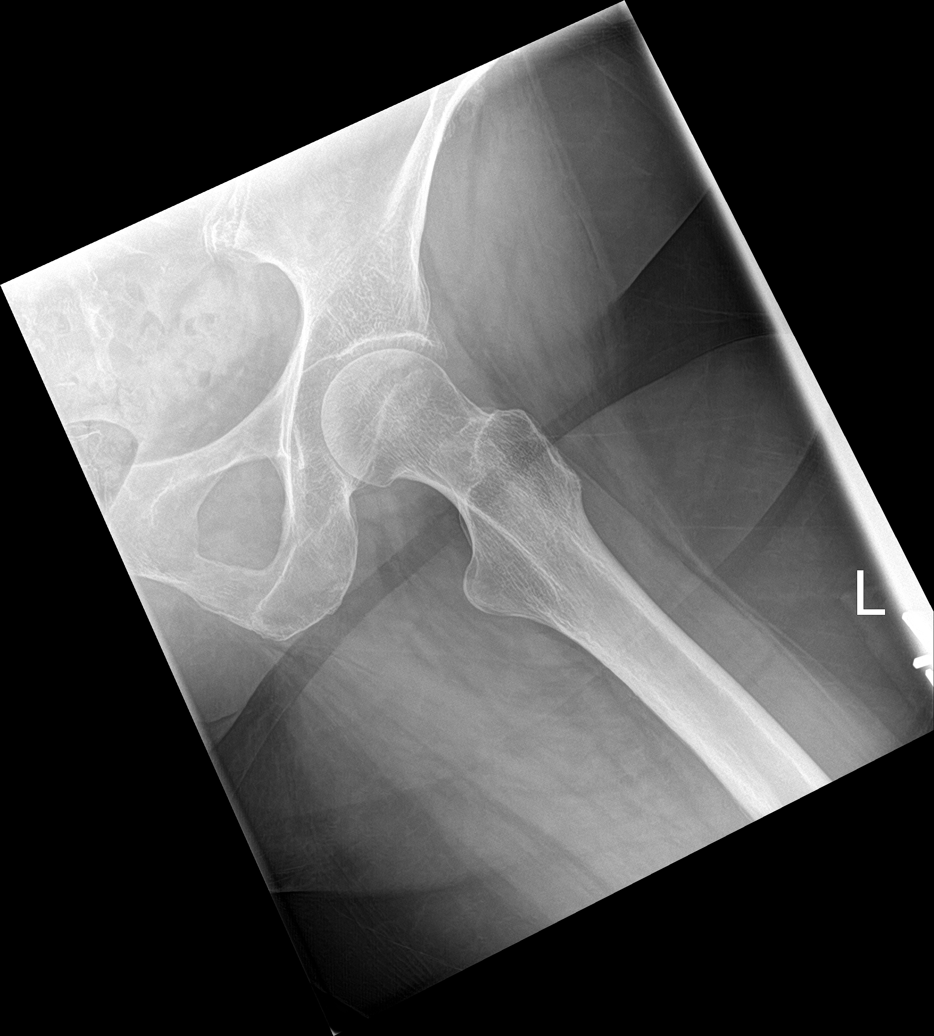

[5 of 5 positions shown; findings below may reference images not displayed]

FINDINGS: No acute fracture. No dislocation.  Unremarkable soft tissues.
IMPRESSION: No acute bony pathology.

## 2017-10-03 NOTE — Assessment & Plan Note (Signed)
Has been flared over past couple.

## 2017-10-03 NOTE — Progress Notes (Signed)
Subjective:  I acted as a Education administrator for Dr. Charlett Blake. [name], [place]  Patient ID: Jasmine Wilson, female    DOB: Apr 02, 1941, 77 y.o.   MRN: [id number]  No chief complaint on file.   HPI  Patient is in today for a 6 month follow up and is noting some recent trouble with allergies. She has persistent head and nasal congestion. Sputum is clear. No fevers or chills. Is noting some right hip pain recently. No recent trauma or falls. Denies CP/palp/SOB/HA/congestion/fevers/GI or GU c/o. Taking meds as prescribed  Patient Care Team: [name], MD as PCP - General (Family Medicine)   Past Medical History:  Diagnosis Date  . Abdominal pain 03/02/2011  . Arthritis 03/02/2012  . Bronchitis 04/04/2011  . Cataract   . Chicken pox as a child  . Cough 01/29/2013  . Female bladder prolapse, acquired 01/29/2011  . Glaucoma 01/29/2011  . Hearing aid worn   . History of pneumonia   . HTN (hypertension) 01/29/2011  . Hyperlipidemia   . Measles as a child  . Meniere's disease   . Pedal edema 04/01/2016  . Preventative health care 01/29/2011  . Shingles   . Vaginosis 01/29/2013    Past Surgical History:  Procedure Laterality Date  . lump removed on left breast     clogged milk duct, benign  . TUBAL LIGATION      Family History  Problem Relation Age of Onset  . Breast cancer Mother   . Stroke Father   . Heart disease Father   . Hyperlipidemia Father   . Hypertension Father   . Breast cancer Sister 31  . Uterine cancer Maternal Grandmother        uterus    Social History   Socioeconomic History  . Marital status: Widowed    Spouse name: Not on file  . Number of children: 3  . Years of education: Not on file  . Highest education level: Not on file  Occupational History  . Occupation: Retired  Scientific laboratory technician  . Financial resource strain: Not on file  . Food insecurity:    Worry: Not on file    Inability: Not on file  . Transportation needs:    Medical: Not on file    Non-medical:  Not on file  Tobacco Use  . Smoking status: Former Smoker    Types: Cigarettes    Last attempt to quit: 06/21/1970    Years since quitting: 47.3  . Smokeless tobacco: Never Used  . Tobacco comment: occasional smoker  Substance and Sexual Activity  . Alcohol use: No  . Drug use: No  . Sexual activity: Never  Lifestyle  . Physical activity:    Days per week: Not on file    Minutes per session: Not on file  . Stress: Not on file  Relationships  . Social connections:    Talks on phone: Not on file    Gets together: Not on file    Attends religious service: Not on file    Active member of club or organization: Not on file    Attends meetings of clubs or organizations: Not on file    Relationship status: Not on file  . Intimate partner violence:    Fear of current or ex partner: Not on file    Emotionally abused: Not on file    Physically abused: Not on file    Forced sexual activity: Not on file  Other Topics Concern  . Not on file  Social History Narrative  . Not on file    Outpatient Medications Prior to Visit  Medication Sig Dispense Refill  . atorvastatin (LIPITOR) 10 MG tablet Take 1 tablet (10 mg total) by mouth daily. 90 tablet 1  . cetirizine (ZYRTEC) 10 MG tablet Take 1 tablet (10 mg total) by mouth daily as needed for allergies or rhinitis (cough). 90 tablet 1  . dorzolamide (TRUSOPT) 2 % ophthalmic solution Place 1 drop into both eyes 2 (two) times daily.     Marland Kitchen latanoprost (XALATAN) 0.005 % ophthalmic solution Place 1 drop into both eyes at bedtime.     . metoprolol succinate (TOPROL-XL) 100 MG 24 hr tablet Take 1 tablet (100 mg total) by mouth daily. Take with or immediately following a meal. 90 tablet 1  . timolol (BETIMOL) 0.5 % ophthalmic solution Place 1 drop into both eyes 2 (two) times daily.     No facility-administered medications prior to visit.     Allergies  Allergen Reactions  . Codeine Other (See Comments)    Makes feel funny    Review of  Systems  Constitutional: Negative for fever and malaise/fatigue.  HENT: Positive for congestion.   Eyes: Negative for blurred vision.  Respiratory: Positive for sputum production. Negative for shortness of breath.   Cardiovascular: Negative for chest pain, palpitations and leg swelling.  Gastrointestinal: Negative for abdominal pain, blood in stool and nausea.  Genitourinary: Negative for dysuria and frequency.  Musculoskeletal: Positive for joint pain. Negative for falls.  Skin: Negative for rash.  Neurological: Negative for dizziness, loss of consciousness and headaches.  Endo/Heme/Allergies: Negative for environmental allergies.  Psychiatric/Behavioral: Negative for depression. The patient is not nervous/anxious.        Objective:    Physical Exam  Constitutional: She is oriented to person, place, and time. She appears well-developed and well-nourished. No distress.  HENT:  Head: Normocephalic and atraumatic.  Nose: Nose normal.  Eyes: Right eye exhibits no discharge. Left eye exhibits no discharge.  Neck: Normal range of motion. Neck supple.  Cardiovascular: Normal rate and regular rhythm.  No murmur heard. Pulmonary/Chest: Effort normal and breath sounds normal.  Abdominal: Soft. Bowel sounds are normal. There is no tenderness.  Musculoskeletal: She exhibits no edema.  Neurological: She is alert and oriented to person, place, and time.  Skin: Skin is warm and dry.  Psychiatric: She has a normal mood and affect.  Nursing note and vitals reviewed.   BP 130/90 (BP Location: Left Arm, Patient Position: Sitting, Cuff Size: Normal)   Pulse 79   Temp 97.7 F (36.5 C) (Oral)   Resp 18   Wt 178 lb 12.8 oz (81.1 kg)   SpO2 97%   BMI 27.19 kg/m  Wt Readings from Last 3 Encounters:  10/03/17 178 lb 12.8 oz (81.1 kg)  04/04/17 176 lb 3.2 oz (79.9 kg)  01/04/17 176 lb 12.8 oz (80.2 kg)   BP Readings from Last 3 Encounters:  10/03/17 130/90  05/18/17 128/82  04/04/17 (!)  156/84     Immunization History  Administered Date(s) Administered  . Pneumococcal Conjugate-13 05/06/2015  . Tdap 02/24/2012    Health Maintenance  Topic Date Due  . DEXA SCAN  02/16/2006  . PNA vac Low Risk Adult (2 of 2 - PPSV23) 05/05/2016  . INFLUENZA VACCINE  01/19/2018  . TETANUS/TDAP  02/23/2022    Lab Results  Component Value Date   WBC 9.0 10/03/2017   HGB 15.3 (H) 10/03/2017   HCT 45.8 10/03/2017  PLT 320.0 10/03/2017   GLUCOSE 94 10/03/2017   CHOL 158 10/03/2017   TRIG 182.0 (H) 10/03/2017   HDL 56.10 10/03/2017   LDLDIRECT 82.0 04/01/2016   LDLCALC 66 10/03/2017   ALT 12 10/03/2017   AST 15 10/03/2017   NA 137 10/03/2017   K 5.1 10/03/2017   CL 101 10/03/2017   CREATININE 0.77 10/03/2017   BUN 18 10/03/2017   CO2 28 10/03/2017   TSH 1.49 10/03/2017    Lab Results  Component Value Date   TSH 1.49 10/03/2017   Lab Results  Component Value Date   WBC 9.0 10/03/2017   HGB 15.3 (H) 10/03/2017   HCT 45.8 10/03/2017   MCV 89.6 10/03/2017   PLT 320.0 10/03/2017   Lab Results  Component Value Date   NA 137 10/03/2017   K 5.1 10/03/2017   CO2 28 10/03/2017   GLUCOSE 94 10/03/2017   BUN 18 10/03/2017   CREATININE 0.77 10/03/2017   BILITOT 0.5 10/03/2017   ALKPHOS 87 10/03/2017   AST 15 10/03/2017   ALT 12 10/03/2017   PROT 7.2 10/03/2017   ALBUMIN 4.3 10/03/2017   CALCIUM 10.2 10/03/2017   GFR 77.34 10/03/2017   Lab Results  Component Value Date   CHOL 158 10/03/2017   Lab Results  Component Value Date   HDL 56.10 10/03/2017   Lab Results  Component Value Date   LDLCALC 66 10/03/2017   Lab Results  Component Value Date   TRIG 182.0 (H) 10/03/2017   Lab Results  Component Value Date   CHOLHDL 3 10/03/2017   No results found for: HGBA1C       Assessment & Plan:   Problem List Items Addressed This Visit    Hyperlipidemia    Tolerating statin, encouraged heart healthy diet, avoid trans fats, minimize simple carbs and  saturated fats. Increase exercise as tolerated      Relevant Orders   Lipid panel (Completed)   HTN (hypertension)    Well controlled, no changes to meds. Encouraged heart healthy diet such as the DASH diet and exercise as tolerated.       Relevant Orders   CBC (Completed)   Comprehensive metabolic panel (Completed)   TSH (Completed)   Arthritis    Right hip pain for years continues to worsen, no radicular symptoms check xray      Cough    Has been flared over past couple.       Right hip pain - Primary    Encouraged moist heat and gentle stretching as tolerated. May try NSAIDs and prescription meds as directed and report if symptoms worsen or seek immediate care. Xray unremarkable for any acute concerns.      Relevant Orders   DG HIPS BILAT W OR W/O PELVIS 2V (Completed)   Environmental allergies    Encouraged Zyrtec and Flonase daily. Consider adding Singulair if symptoms worsen.          I am having Almyra Deforest maintain her latanoprost, dorzolamide, timolol, cetirizine, atorvastatin, and metoprolol succinate.  No orders of the defined types were placed in this encounter.   CMA served as Education administrator during this visit. History, Physical and Plan performed by medical provider. Documentation and orders reviewed and attested to.  Penni Homans, MD

## 2017-10-03 NOTE — Assessment & Plan Note (Signed)
Right hip pain for years continues to worsen, no radicular symptoms check xray

## 2017-10-03 NOTE — Assessment & Plan Note (Signed)
Tolerating statin, encouraged heart healthy diet, avoid trans fats, minimize simple carbs and saturated fats. Increase exercise as tolerated 

## 2017-10-03 NOTE — Assessment & Plan Note (Signed)
Well controlled, no changes to meds. Encouraged heart healthy diet such as the DASH diet and exercise as tolerated.  °

## 2017-10-03 NOTE — Patient Instructions (Addendum)
Slippery elm tea helps with cough  Lidocaine patches  From [facility], [place] and [place] Hip Pain The hip is the joint between the upper legs and the lower pelvis. The bones, cartilage, tendons, and muscles of your hip joint support your body and allow you to move around. Hip pain can range from a minor ache to severe pain in one or both of your hips. The pain may be felt on the inside of the hip joint near the groin, or the outside near the buttocks and upper thigh. You may also have swelling or stiffness. Follow these instructions at home: Managing pain, stiffness, and swelling  If directed, apply ice to the injured area. ? Put ice in a plastic bag. ? Place a towel between your skin and the bag. ? Leave the ice on for 20 minutes, 2-3 times a day  Sleep with a pillow between your legs on your most comfortable side.  Avoid any activities that cause pain. General instructions  Take over-the-counter and prescription medicines only as told by your health care provider.  Do any exercises as told by your health care provider.  Record the following: ? How often you have hip pain. ? The location of your pain. ? What the pain feels like. ? What makes the pain worse.  Keep all follow-up visits as told by your health care provider. This is important. Contact a health care provider if:  You cannot put weight on your leg.  Your pain or swelling continues or gets worse after one week.  It gets harder to walk.  You have a fever. Get help right away if:  You fall.  You have a sudden increase in pain and swelling in your hip.  Your hip is red or swollen or very tender to touch. Summary  Hip pain can range from a minor ache to severe pain in one or both of your hips.  The pain may be felt on the inside of the hip joint near the groin, or the outside near the buttocks and upper thigh.  Avoid any activities that cause pain.  Record how often you have hip pain, the location of  the pain, what makes it worse and what it feels like. This information is not intended to replace advice given to you by your health care provider. Make sure you discuss any questions you have with your health care provider. Document Released: 11/25/2009 Document Revised: 05/10/2016 Document Reviewed: 05/10/2016 Elsevier Interactive Patient Education  [employer].

## 2017-10-10 DIAGNOSIS — M25551 Pain in right hip: Secondary | ICD-10-CM | POA: Insufficient documentation

## 2017-10-10 DIAGNOSIS — Z9109 Other allergy status, other than to drugs and biological substances: Secondary | ICD-10-CM | POA: Insufficient documentation

## 2017-10-10 NOTE — Assessment & Plan Note (Signed)
Encouraged Zyrtec and Flonase daily. Consider adding Singulair if symptoms worsen.

## 2017-10-10 NOTE — Assessment & Plan Note (Signed)
Encouraged moist heat and gentle stretching as tolerated. May try NSAIDs and prescription meds as directed and report if symptoms worsen or seek immediate care. Xray unremarkable for any acute concerns.

## 2017-11-07 ENCOUNTER — Other Ambulatory Visit: Payer: Self-pay | Admitting: Family Medicine

## 2017-12-29 NOTE — Progress Notes (Deleted)
Subjective:   Jasmine Wilson is a 77 y.o. female who presents for Medicare Annual (Subsequent) preventive examination.  Review of Systems: No ROS.  Medicare Wellness Visit. Additional risk factors are reflected in the social history.   Sleep patterns:    Home Safety/Smoke Alarms: Feels safe in home. Smoke alarms in place.  Living environment; residence and Firearm Safety:    Female:         Mammo-       Dexa scan-        CCS- due 04/2021    Objective:     Vitals: There were no vitals taken for this visit.  There is no height or weight on file to calculate BMI.  Advanced Directives 01/04/2017 10/01/2014  Does Patient Have a Medical Advance Directive? No No  Would patient like information on creating a medical advance directive? No - Patient declined Yes - Scientist, clinical (histocompatibility and immunogenetics) given    Tobacco Social History   Tobacco Use  Smoking Status Former Smoker  . Types: Cigarettes  . Last attempt to quit: 06/21/1970  . Years since quitting: 47.5  Smokeless Tobacco Never Used  Tobacco Comment   occasional smoker     Counseling given: Not Answered Comment: occasional smoker   Clinical Intake:                       Past Medical History:  Diagnosis Date  . Abdominal pain 03/02/2011  . Arthritis 03/02/2012  . Bronchitis 04/04/2011  . Cataract   . Chicken pox as a child  . Cough 01/29/2013  . Female bladder prolapse, acquired 01/29/2011  . Glaucoma 01/29/2011  . Hearing aid worn   . History of pneumonia   . HTN (hypertension) 01/29/2011  . Hyperlipidemia   . Measles as a child  . Meniere's disease   . Pedal edema 04/01/2016  . Preventative health care 01/29/2011  . Shingles   . Vaginosis 01/29/2013   Past Surgical History:  Procedure Laterality Date  . lump removed on left breast     clogged milk duct, benign  . TUBAL LIGATION     Family History  Problem Relation Age of Onset  . Breast cancer Mother   . Stroke Father   . Heart disease Father   .  Hyperlipidemia Father   . Hypertension Father   . Breast cancer Sister 78  . Uterine cancer Maternal Grandmother        uterus   Social History   Socioeconomic History  . Marital status: Widowed    Spouse name: Not on file  . Number of children: 3  . Years of education: Not on file  . Highest education level: Not on file  Occupational History  . Occupation: Retired  Scientific laboratory technician  . Financial resource strain: Not on file  . Food insecurity:    Worry: Not on file    Inability: Not on file  . Transportation needs:    Medical: Not on file    Non-medical: Not on file  Tobacco Use  . Smoking status: Former Smoker    Types: Cigarettes    Last attempt to quit: 06/21/1970    Years since quitting: 47.5  . Smokeless tobacco: Never Used  . Tobacco comment: occasional smoker  Substance and Sexual Activity  . Alcohol use: No  . Drug use: No  . Sexual activity: Never  Lifestyle  . Physical activity:    Days per week: Not on file  Minutes per session: Not on file  . Stress: Not on file  Relationships  . Social connections:    Talks on phone: Not on file    Gets together: Not on file    Attends religious service: Not on file    Active member of club or organization: Not on file    Attends meetings of clubs or organizations: Not on file    Relationship status: Not on file  Other Topics Concern  . Not on file  Social History Narrative  . Not on file    Outpatient Encounter Medications as of 01/05/2018  Medication Sig  . atorvastatin (LIPITOR) 10 MG tablet TAKE 1 TABLET EVERY DAY  . cetirizine (ZYRTEC) 10 MG tablet Take 1 tablet (10 mg total) by mouth daily as needed for allergies or rhinitis (cough).  . dorzolamide (TRUSOPT) 2 % ophthalmic solution Place 1 drop into both eyes 2 (two) times daily.   Marland Kitchen latanoprost (XALATAN) 0.005 % ophthalmic solution Place 1 drop into both eyes at bedtime.   . metoprolol succinate (TOPROL-XL) 100 MG 24 hr tablet TAKE 1 TABLET DAILY. TAKE WITH  OR IMMEDIATELY FOLLOWING A MEAL  . timolol (BETIMOL) 0.5 % ophthalmic solution Place 1 drop into both eyes 2 (two) times daily.   No facility-administered encounter medications on file as of 01/05/2018.     Activities of Daily Living In your present state of health, do you have any difficulty performing the following activities: 01/04/2017  Hearing? Y  Comment wearing hearing aids  Vision? N  Difficulty concentrating or making decisions? N  Walking or climbing stairs? N  Dressing or bathing? N  Doing errands, shopping? N  Preparing Food and eating ? N  Using the Toilet? N  In the past six months, have you accidently leaked urine? Y  Comment wears pads  Do you have problems with loss of bowel control? N  Managing your Medications? N  Managing your Finances? N  Housekeeping or managing your Housekeeping? N  Some recent data might be hidden    Patient Care Team: [name], MD as PCP - General (Family Medicine)    Assessment:   This is a routine wellness examination for [facility]. Physical assessment deferred to PCP.  Exercise Activities and Dietary recommendations   Diet (meal preparation, eat out, water intake, caffeinated beverages, dairy products, fruits and vegetables): {Desc; diets:16563} Breakfast: Lunch:  Dinner:      Goals    None      Fall Risk Fall Risk  04/04/2017 01/04/2017 04/01/2016 10/01/2014 03/18/2013  Falls in the past year? Yes No No No No  Number falls in past yr: 1 - - - -  Injury with Fall? Yes - - - -  Comment Knee, elbow,  - - - -   Depression Screen PHQ 2/9 Scores 04/04/2017 01/04/2017 04/01/2016 10/01/2014  PHQ - 2 Score 0 0 0 0     Cognitive Function MMSE - Mini Mental State Exam 01/04/2017  Orientation to time 5  Orientation to Place 5  Registration 3  Attention/ Calculation 5  Recall 2  Language- name 2 objects 2  Language- repeat 1  Language- follow 3 step command 3  Language- read & follow direction 1  Write a sentence 1  Copy  design 1  Total score 29        Immunization History  Administered Date(s) Administered  . Pneumococcal Conjugate-13 05/06/2015  . Tdap 02/24/2012    Screening Tests Health Maintenance  Topic Date  Due  . DEXA SCAN  02/16/2006  . PNA vac Low Risk Adult (2 of 2 - PPSV23) 05/05/2016  . INFLUENZA VACCINE  01/19/2018  . TETANUS/TDAP  02/23/2022      Plan:   ***   I have personally reviewed and noted the following in the patient's chart:   . Medical and social history . Use of alcohol, tobacco or illicit drugs  . Current medications and supplements . Functional ability and status . Nutritional status . Physical activity . Advanced directives . List of other physicians . Hospitalizations, surgeries, and ER visits in previous 12 months . Vitals . Screenings to include cognitive, depression, and falls . Referrals and appointments  In addition, I have reviewed and discussed with patient certain preventive protocols, quality metrics, and best practice recommendations. A written personalized care plan for preventive services as well as general preventive health recommendations were provided to patient.     Shela Nevin, [place]  12/29/2017

## 2018-01-05 ENCOUNTER — Ambulatory Visit: Payer: Medicare HMO | Admitting: *Deleted

## 2018-02-03 DIAGNOSIS — H401131 Primary open-angle glaucoma, bilateral, mild stage: Secondary | ICD-10-CM | POA: Diagnosis not present

## 2018-02-14 NOTE — Progress Notes (Addendum)
Subjective:   Jasmine Wilson is a 77 y.o. female who presents for Medicare Annual (Subsequent) preventive examination.  Review of Systems:  No ROS.  Medicare Wellness Visit. Additional risk factors are reflected in the social history. Cardiac Risk Factors include: advanced age (>47men, >45 women);dyslipidemia;hypertension Sleep patterns: Sleeps well most of the time. Naps as needed.  Living environment: Lives alone. No stairs. 1 story home. Walk-in shower.  Female:        Mammo-  ordered     Dexa scan-  declines      CCS-due 04/2021     Objective:     Vitals: BP 136/84 (BP Location: Left Arm, Patient Position: Sitting, Cuff Size: Normal)   Pulse 78   Ht 5\' 8"  (1.727 m)   Wt 183 lb (83 kg)   SpO2 97%   BMI 27.83 kg/m   Body mass index is 27.83 kg/m.  Advanced Directives 02/17/2018 01/04/2017 10/01/2014  Does Patient Have a Medical Advance Directive? No No No  Would patient like information on creating a medical advance directive? Yes (MAU/Ambulatory/Procedural Areas - Information given) No - Patient declined Yes - Educational materials given    Tobacco Social History   Tobacco Use  Smoking Status Former Smoker  . Types: Cigarettes  . Last attempt to quit: 06/21/1970  . Years since quitting: 47.6  Smokeless Tobacco Never Used  Tobacco Comment   occasional smoker     Counseling given: Not Answered Comment: occasional smoker   Clinical Intake: Pain : No/denies pain    Past Medical History:  Diagnosis Date  . Abdominal pain 03/02/2011  . Arthritis 03/02/2012  . Bronchitis 04/04/2011  . Cataract   . Chicken pox as a child  . Cough 01/29/2013  . Female bladder prolapse, acquired 01/29/2011  . Glaucoma 01/29/2011  . Hearing aid worn   . History of pneumonia   . HTN (hypertension) 01/29/2011  . Hyperlipidemia   . Measles as a child  . Meniere's disease   . Pedal edema 04/01/2016  . Preventative health care 01/29/2011  . Shingles   . Vaginosis 01/29/2013   Past  Surgical History:  Procedure Laterality Date  . lump removed on left breast     clogged milk duct, benign  . TUBAL LIGATION     Family History  Problem Relation Age of Onset  . Breast cancer Mother   . Stroke Father   . Heart disease Father   . Hyperlipidemia Father   . Hypertension Father   . Breast cancer Sister 80  . Uterine cancer Maternal Grandmother        uterus   Social History   Socioeconomic History  . Marital status: Widowed    Spouse name: Not on file  . Number of children: 3  . Years of education: Not on file  . Highest education level: Not on file  Occupational History  . Occupation: Retired  Scientific laboratory technician  . Financial resource strain: Not on file  . Food insecurity:    Worry: Not on file    Inability: Not on file  . Transportation needs:    Medical: Not on file    Non-medical: Not on file  Tobacco Use  . Smoking status: Former Smoker    Types: Cigarettes    Last attempt to quit: 06/21/1970    Years since quitting: 47.6  . Smokeless tobacco: Never Used  . Tobacco comment: occasional smoker  Substance and Sexual Activity  . Alcohol use: No  . Drug  use: No  . Sexual activity: Never  Lifestyle  . Physical activity:    Days per week: Not on file    Minutes per session: Not on file  . Stress: Not on file  Relationships  . Social connections:    Talks on phone: Not on file    Gets together: Not on file    Attends religious service: Not on file    Active member of club or organization: Not on file    Attends meetings of clubs or organizations: Not on file    Relationship status: Not on file  Other Topics Concern  . Not on file  Social History Narrative  . Not on file    Outpatient Encounter Medications as of 02/17/2018  Medication Sig  . atorvastatin (LIPITOR) 10 MG tablet TAKE 1 TABLET EVERY DAY  . cetirizine (ZYRTEC) 10 MG tablet Take 1 tablet (10 mg total) by mouth daily as needed for allergies or rhinitis (cough).  . dorzolamide (TRUSOPT) 2  % ophthalmic solution Place 1 drop into both eyes 2 (two) times daily.   Marland Kitchen latanoprost (XALATAN) 0.005 % ophthalmic solution Place 1 drop into both eyes at bedtime.   . metoprolol succinate (TOPROL-XL) 100 MG 24 hr tablet TAKE 1 TABLET DAILY. TAKE WITH OR IMMEDIATELY FOLLOWING A MEAL  . timolol (BETIMOL) 0.5 % ophthalmic solution Place 1 drop into both eyes 2 (two) times daily.   No facility-administered encounter medications on file as of 02/17/2018.     Activities of Daily Living In your present state of health, do you have any difficulty performing the following activities: 02/17/2018  Hearing? Y  Comment wearing hearing aids  Vision? N  Difficulty concentrating or making decisions? N  Walking or climbing stairs? N  Dressing or bathing? N  Doing errands, shopping? N  Preparing Food and eating ? N  Using the Toilet? N  In the past six months, have you accidently leaked urine? N  Do you have problems with loss of bowel control? N  Managing your Medications? N  Managing your Finances? N  Housekeeping or managing your Housekeeping? N  Some recent data might be hidden    Patient Care Team: [name], MD as PCP - General (Family Medicine)    Assessment:   This is a routine wellness examination for [facility]. Physical assessment deferred to PCP.   Exercise Activities and Dietary recommendations Current Exercise Habits: The patient does not participate in regular exercise at present, Exercise limited by: None identified Diet (meal preparation, eat out, water intake, caffeinated beverages, dairy products, fruits and vegetables): well balanced, on average, 2 meals per day   Goals    . Improve balance with new exercise (pt-stated)       Fall Risk Fall Risk  02/17/2018 04/04/2017 01/04/2017 04/01/2016 10/01/2014  Falls in the past year? No Yes No No No  Number falls in past yr: - 1 - - -  Injury with Fall? - Yes - - -  Comment - Knee, elbow,  - - -    Depression Screen PHQ 2/9  Scores 02/17/2018 04/04/2017 01/04/2017 04/01/2016  PHQ - 2 Score 0 0 0 0     Cognitive Function Ad8 score reviewed for issues:  Issues making decisions:no  Less interest in hobbies / activities:no  Repeats questions, stories (family complaining):no  Trouble using ordinary gadgets (microwave, computer, phone):no  Forgets the month or year: no  Mismanaging finances: no  Remembering appts:no  Daily problems with thinking and/or memory:no  Ad8 score is=0   MMSE - Mini Mental State Exam 01/04/2017  Orientation to time 5  Orientation to Place 5  Registration 3  Attention/ Calculation 5  Recall 2  Language- name 2 objects 2  Language- repeat 1  Language- follow 3 step command 3  Language- read & follow direction 1  Write a sentence 1  Copy design 1  Total score 29        Immunization History  Administered Date(s) Administered  . Pneumococcal Conjugate-13 05/06/2015  . Tdap 02/24/2012     Screening Tests Health Maintenance  Topic Date Due  . DEXA SCAN  02/16/2006  . PNA vac Low Risk Adult (2 of 2 - PPSV23) 05/05/2016  . INFLUENZA VACCINE  01/19/2018  . TETANUS/TDAP  02/23/2022       Plan:    Please schedule your next medicare wellness visit with me in 1 yr.  Continue to eat heart healthy diet (full of fruits, vegetables, whole grains, lean protein, water--limit salt, fat, and sugar intake) and increase physical activity as tolerated.  I have ordered your mammogram. Please schedule.  I have personally reviewed and noted the following in the patient's chart:   . Medical and social history . Use of alcohol, tobacco or illicit drugs  . Current medications and supplements . Functional ability and status . Nutritional status . Physical activity . Advanced directives . List of other physicians . Hospitalizations, surgeries, and ER visits in previous 12 months . Vitals . Screenings to include cognitive, depression, and falls . Referrals and  appointments  In addition, I have reviewed and discussed with patient certain preventive protocols, quality metrics, and best practice recommendations. A written personalized care plan for preventive services as well as general preventive health recommendations were provided to patient.     Shela Nevin, [place]  02/17/2018   Medical screening examination/treatment was performed by qualified clinical staff member and as supervising physician I was immediately available for consultation/collaboration. I have reviewed documentation and agree with assessment and plan.  Penni Homans, MD

## 2018-02-17 ENCOUNTER — Other Ambulatory Visit: Payer: Self-pay | Admitting: Family Medicine

## 2018-02-17 ENCOUNTER — Encounter: Payer: Self-pay | Admitting: *Deleted

## 2018-02-17 ENCOUNTER — Telehealth: Payer: Self-pay | Admitting: *Deleted

## 2018-02-17 ENCOUNTER — Ambulatory Visit (INDEPENDENT_AMBULATORY_CARE_PROVIDER_SITE_OTHER): Payer: Medicare HMO | Admitting: *Deleted

## 2018-02-17 VITALS — BP 136/84 | HR 78 | Ht 68.0 in | Wt 183.0 lb

## 2018-02-17 DIAGNOSIS — T753XXS Motion sickness, sequela: Secondary | ICD-10-CM

## 2018-02-17 DIAGNOSIS — Z0000 Encounter for general adult medical examination without abnormal findings: Secondary | ICD-10-CM | POA: Diagnosis not present

## 2018-02-17 DIAGNOSIS — Z1239 Encounter for other screening for malignant neoplasm of breast: Secondary | ICD-10-CM

## 2018-02-17 MED ORDER — ONDANSETRON HCL 4 MG PO TABS: 4.0000 mg | ORAL_TABLET | Freq: Three times a day (TID) | ORAL | 1 refills | Status: DC | PRN

## 2018-02-17 MED ORDER — SCOPOLAMINE 1 MG/3DAYS TD PT72: 1.0000 | MEDICATED_PATCH | TRANSDERMAL | 0 refills | Status: DC

## 2018-02-17 NOTE — Telephone Encounter (Signed)
Pt notified of Dr.Blyth's advice and new prescriptions as shown below.      Jasmine Wilson Female, 77 y.o., [birth year] MRN:  [id number] Phone:  [phone] (H) PCP:  [name], MD Coverage:  [employer] Next Appt With Family Medicine Penni Homans, MD) 04/03/2018 at 1:45 PM Message  Received: Today  Message Contents  [name], MD  Dennis Bast, RN  Cc: Magdalene Molly, RMA        For sleep she should try Melatonin 5-10 mg qhs if no response try OTC Unisom for sleep. Would not want to mix anything stronger with the patches while she is out of the country. I have sent in scopolamine patches and some zofran for nausea.   Previous Messages    ----- Message -----  From: Dennis Bast, RN  Sent: 02/17/2018 11:34 AM EDT  To: [name], MD   I saw this pt today for AWV. She is going on a 12 day cruise leaving Sept 12th. She is concerned about getting sea sick and that she does not sleep well when away from home. We discussed scopolamine patches, ginger caps or chews, and something for sleep. This is a reminder at your request. Thanks!

## 2018-02-17 NOTE — Patient Instructions (Signed)
Please schedule your next medicare wellness visit with me in 1 yr.  Continue to eat heart healthy diet (full of fruits, vegetables, whole grains, lean protein, water--limit salt, fat, and sugar intake) and increase physical activity as tolerated.  I have ordered your mammogram. Please schedule.   Jasmine Wilson , Thank you for taking time to come for your Medicare Wellness Visit. I appreciate your ongoing commitment to your health goals. Please review the following plan we discussed and let me know if I can assist you in the future.   These are the goals we discussed: Goals    . Improve balance with new exercise (pt-stated)       This is a list of the screening recommended for you and due dates:  Health Maintenance  Topic Date Due  . DEXA scan (bone density measurement)  02/16/2006  . Pneumonia vaccines (2 of 2 - PPSV23) 05/05/2016  . Flu Shot  01/19/2018  . Tetanus Vaccine  02/23/2022    Health Maintenance for Postmenopausal Women Menopause is a normal process in which your reproductive ability comes to an end. This process happens gradually over a span of months to years, usually between the ages of 66 and 23. Menopause is complete when you have missed 12 consecutive menstrual periods. It is important to talk with your health care provider about some of the most common conditions that affect postmenopausal women, such as heart disease, cancer, and bone loss (osteoporosis). Adopting a healthy lifestyle and getting preventive care can help to promote your health and wellness. Those actions can also lower your chances of developing some of these common conditions. What should I know about menopause? During menopause, you may experience a number of symptoms, such as:  Moderate-to-severe hot flashes.  Night sweats.  Decrease in sex drive.  Mood swings.  Headaches.  Tiredness.  Irritability.  Memory problems.  Insomnia.  Choosing to treat or not to treat menopausal changes is  an individual decision that you make with your health care provider. What should I know about hormone replacement therapy and supplements? Hormone therapy products are effective for treating symptoms that are associated with menopause, such as hot flashes and night sweats. Hormone replacement carries certain risks, especially as you become older. If you are thinking about using estrogen or estrogen with progestin treatments, discuss the benefits and risks with your health care provider. What should I know about heart disease and stroke? Heart disease, heart attack, and stroke become more likely as you age. This may be due, in part, to the hormonal changes that your body experiences during menopause. These can affect how your body processes dietary fats, triglycerides, and cholesterol. Heart attack and stroke are both medical emergencies. There are many things that you can do to help prevent heart disease and stroke:  Have your blood pressure checked at least every 1-2 years. High blood pressure causes heart disease and increases the risk of stroke.  If you are 59-60 years old, ask your health care provider if you should take aspirin to prevent a heart attack or a stroke.  Do not use any tobacco products, including cigarettes, chewing tobacco, or electronic cigarettes. If you need help quitting, ask your health care provider.  It is important to eat a healthy diet and maintain a healthy weight. ? Be sure to include plenty of vegetables, fruits, low-fat dairy products, and lean protein. ? Avoid eating foods that are high in solid fats, added sugars, or salt (sodium).  Get regular exercise.  This is one of the most important things that you can do for your health. ? Try to exercise for at least 150 minutes each week. The type of exercise that you do should increase your heart rate and make you sweat. This is known as moderate-intensity exercise. ? Try to do strengthening exercises at least twice each  week. Do these in addition to the moderate-intensity exercise.  Know your numbers.Ask your health care provider to check your cholesterol and your blood glucose. Continue to have your blood tested as directed by your health care provider.  What should I know about cancer screening? There are several types of cancer. Take the following steps to reduce your risk and to catch any cancer development as early as possible. Breast Cancer  Practice breast self-awareness. ? This means understanding how your breasts normally appear and feel. ? It also means doing regular breast self-exams. Let your health care provider know about any changes, no matter how small.  If you are 70 or older, have a clinician do a breast exam (clinical breast exam or CBE) every year. Depending on your age, family history, and medical history, it may be recommended that you also have a yearly breast X-ray (mammogram).  If you have a family history of breast cancer, talk with your health care provider about genetic screening.  If you are at high risk for breast cancer, talk with your health care provider about having an MRI and a mammogram every year.  Breast cancer (BRCA) gene test is recommended for women who have family members with BRCA-related cancers. Results of the assessment will determine the need for genetic counseling and BRCA1 and for BRCA2 testing. BRCA-related cancers include these types: ? Breast. This occurs in males or females. ? Ovarian. ? Tubal. This may also be called fallopian tube cancer. ? Cancer of the abdominal or pelvic lining (peritoneal cancer). ? Prostate. ? Pancreatic.  Cervical, Uterine, and Ovarian Cancer Your health care provider may recommend that you be screened regularly for cancer of the pelvic organs. These include your ovaries, uterus, and vagina. This screening involves a pelvic exam, which includes checking for microscopic changes to the surface of your cervix (Pap test).  For  women ages 21-65, health care providers may recommend a pelvic exam and a Pap test every three years. For women ages 60-65, they may recommend the Pap test and pelvic exam, combined with testing for human papilloma virus (HPV), every five years. Some types of HPV increase your risk of cervical cancer. Testing for HPV may also be done on women of any age who have unclear Pap test results.  Other health care providers may not recommend any screening for nonpregnant women who are considered low risk for pelvic cancer and have no symptoms. Ask your health care provider if a screening pelvic exam is right for you.  If you have had past treatment for cervical cancer or a condition that could lead to cancer, you need Pap tests and screening for cancer for at least 20 years after your treatment. If Pap tests have been discontinued for you, your risk factors (such as having a new sexual partner) need to be reassessed to determine if you should start having screenings again. Some women have medical problems that increase the chance of getting cervical cancer. In these cases, your health care provider may recommend that you have screening and Pap tests more often.  If you have a family history of uterine cancer or ovarian cancer, talk with  your health care provider about genetic screening.  If you have vaginal bleeding after reaching menopause, tell your health care provider.  There are currently no reliable tests available to screen for ovarian cancer.  Lung Cancer Lung cancer screening is recommended for adults 39-47 years old who are at high risk for lung cancer because of a history of smoking. A yearly low-dose CT scan of the lungs is recommended if you:  Currently smoke.  Have a history of at least 30 pack-years of smoking and you currently smoke or have quit within the past 15 years. A pack-year is smoking an average of one pack of cigarettes per day for one year.  Yearly screening should:  Continue  until it has been 15 years since you quit.  Stop if you develop a health problem that would prevent you from having lung cancer treatment.  Colorectal Cancer  This type of cancer can be detected and can often be prevented.  Routine colorectal cancer screening usually begins at age 61 and continues through age 39.  If you have risk factors for colon cancer, your health care provider may recommend that you be screened at an earlier age.  If you have a family history of colorectal cancer, talk with your health care provider about genetic screening.  Your health care provider may also recommend using home test kits to check for hidden blood in your stool.  A small camera at the end of a tube can be used to examine your colon directly (sigmoidoscopy or colonoscopy). This is done to check for the earliest forms of colorectal cancer.  Direct examination of the colon should be repeated every 5-10 years until age 57. However, if early forms of precancerous polyps or small growths are found or if you have a family history or genetic risk for colorectal cancer, you may need to be screened more often.  Skin Cancer  Check your skin from head to toe regularly.  Monitor any moles. Be sure to tell your health care provider: ? About any new moles or changes in moles, especially if there is a change in a mole's shape or color. ? If you have a mole that is larger than the size of a pencil eraser.  If any of your family members has a history of skin cancer, especially at a young age, talk with your health care provider about genetic screening.  Always use sunscreen. Apply sunscreen liberally and repeatedly throughout the day.  Whenever you are outside, protect yourself by wearing long sleeves, pants, a wide-brimmed hat, and sunglasses.  What should I know about osteoporosis? Osteoporosis is a condition in which bone destruction happens more quickly than new bone creation. After menopause, you may be  at an increased risk for osteoporosis. To help prevent osteoporosis or the bone fractures that can happen because of osteoporosis, the following is recommended:  If you are 59-69 years old, get at least 1,000 mg of calcium and at least 600 mg of vitamin D per day.  If you are older than age 82 but younger than age 48, get at least 1,200 mg of calcium and at least 600 mg of vitamin D per day.  If you are older than age 73, get at least 1,200 mg of calcium and at least 800 mg of vitamin D per day.  Smoking and excessive alcohol intake increase the risk of osteoporosis. Eat foods that are rich in calcium and vitamin D, and do weight-bearing exercises several times each week as  directed by your health care provider. What should I know about how menopause affects my mental health? Depression may occur at any age, but it is more common as you become older. Common symptoms of depression include:  Low or sad mood.  Changes in sleep patterns.  Changes in appetite or eating patterns.  Feeling an overall lack of motivation or enjoyment of activities that you previously enjoyed.  Frequent crying spells.  Talk with your health care provider if you think that you are experiencing depression. What should I know about immunizations? It is important that you get and maintain your immunizations. These include:  Tetanus, diphtheria, and pertussis (Tdap) booster vaccine.  Influenza every year before the flu season begins.  Pneumonia vaccine.  Shingles vaccine.  Your health care provider may also recommend other immunizations. This information is not intended to replace advice given to you by your health care provider. Make sure you discuss any questions you have with your health care provider. Document Released: 07/30/2005 Document Revised: 12/26/2015 Document Reviewed: 03/11/2015 Elsevier Interactive Patient Education  2018 [employer].

## 2018-03-28 ENCOUNTER — Ambulatory Visit (HOSPITAL_BASED_OUTPATIENT_CLINIC_OR_DEPARTMENT_OTHER): Payer: Medicare HMO

## 2018-04-03 ENCOUNTER — Ambulatory Visit (INDEPENDENT_AMBULATORY_CARE_PROVIDER_SITE_OTHER): Payer: Medicare HMO | Admitting: Family Medicine

## 2018-04-03 VITALS — BP 124/76 | HR 82 | Temp 97.7°F | Resp 18 | Wt 182.2 lb

## 2018-04-03 DIAGNOSIS — Z0000 Encounter for general adult medical examination without abnormal findings: Secondary | ICD-10-CM

## 2018-04-03 DIAGNOSIS — Z23 Encounter for immunization: Secondary | ICD-10-CM | POA: Diagnosis not present

## 2018-04-03 DIAGNOSIS — R600 Localized edema: Secondary | ICD-10-CM

## 2018-04-03 DIAGNOSIS — E782 Mixed hyperlipidemia: Secondary | ICD-10-CM | POA: Diagnosis not present

## 2018-04-03 DIAGNOSIS — I10 Essential (primary) hypertension: Secondary | ICD-10-CM

## 2018-04-03 MED ORDER — METOPROLOL SUCCINATE ER 100 MG PO TB24: ORAL_TABLET | ORAL | 1 refills | Status: DC

## 2018-04-03 MED ORDER — ATORVASTATIN CALCIUM 10 MG PO TABS
10.0000 mg | ORAL_TABLET | Freq: Every day | ORAL | 1 refills | Status: DC
Start: 2018-04-03 — End: 2018-08-23

## 2018-04-03 NOTE — Assessment & Plan Note (Signed)
Was flared on recent trip of 50 days on tour buses and cruises. So encouraged to increase elevation of feet, wear compression hose and minimize sodium

## 2018-04-03 NOTE — Patient Instructions (Addendum)
shingrix is the new shingles shot 2 shots over 2-6 months at the pharmacy  Hypertension Hypertension, commonly called high blood pressure, is when the force of blood pumping through the arteries is too strong. The arteries are the blood vessels that carry blood from the heart throughout the body. Hypertension forces the heart to work harder to pump blood and may cause arteries to become narrow or stiff. Having untreated or uncontrolled hypertension can cause heart attacks, strokes, kidney disease, and other problems. A blood pressure reading consists of a higher number over a lower number. Ideally, your blood pressure should be below 120/80. The first ("top") number is called the systolic pressure. It is a measure of the pressure in your arteries as your heart beats. The second ("bottom") number is called the diastolic pressure. It is a measure of the pressure in your arteries as the heart relaxes. What are the causes? The cause of this condition is not known. What increases the risk? Some risk factors for high blood pressure are under your control. Others are not. Factors you can change  Smoking.  Having type 2 diabetes mellitus, high cholesterol, or both.  Not getting enough exercise or physical activity.  Being overweight.  Having too much fat, sugar, calories, or salt (sodium) in your diet.  Drinking too much alcohol. Factors that are difficult or impossible to change  Having chronic kidney disease.  Having a family history of high blood pressure.  Age. Risk increases with age.  Race. You may be at higher risk if you are African-American.  Gender. Men are at higher risk than women before age 42. After age 56, women are at higher risk than men.  Having obstructive sleep apnea.  Stress. What are the signs or symptoms? Extremely high blood pressure (hypertensive crisis) may cause:  Headache.  Anxiety.  Shortness of breath.  Nosebleed.  Nausea and vomiting.  Severe  chest pain.  Jerky movements you cannot control (seizures).  How is this diagnosed? This condition is diagnosed by measuring your blood pressure while you are seated, with your arm resting on a surface. The cuff of the blood pressure monitor will be placed directly against the skin of your upper arm at the level of your heart. It should be measured at least twice using the same arm. Certain conditions can cause a difference in blood pressure between your right and left arms. Certain factors can cause blood pressure readings to be lower or higher than normal (elevated) for a short period of time:  When your blood pressure is higher when you are in a health care provider's office than when you are at home, this is called white coat hypertension. Most people with this condition do not need medicines.  When your blood pressure is higher at home than when you are in a health care provider's office, this is called masked hypertension. Most people with this condition may need medicines to control blood pressure.  If you have a high blood pressure reading during one visit or you have normal blood pressure with other risk factors:  You may be asked to return on a different day to have your blood pressure checked again.  You may be asked to monitor your blood pressure at home for 1 week or longer.  If you are diagnosed with hypertension, you may have other blood or imaging tests to help your health care provider understand your overall risk for other conditions. How is this treated? This condition is treated by making healthy  lifestyle changes, such as eating healthy foods, exercising more, and reducing your alcohol intake. Your health care provider may prescribe medicine if lifestyle changes are not enough to get your blood pressure under control, and if:  Your systolic blood pressure is above 130.  Your diastolic blood pressure is above 80.  Your personal target blood pressure may vary depending on  your medical conditions, your age, and other factors. Follow these instructions at home: Eating and drinking  Eat a diet that is high in fiber and potassium, and low in sodium, added sugar, and fat. An example eating plan is called the DASH (Dietary Approaches to Stop Hypertension) diet. To eat this way: ? Eat plenty of fresh fruits and vegetables. Try to fill half of your plate at each meal with fruits and vegetables. ? Eat whole grains, such as whole wheat pasta, brown rice, or whole grain bread. Fill about one quarter of your plate with whole grains. ? Eat or drink low-fat dairy products, such as skim milk or low-fat yogurt. ? Avoid fatty cuts of meat, processed or cured meats, and poultry with skin. Fill about one quarter of your plate with lean proteins, such as fish, chicken without skin, beans, eggs, and tofu. ? Avoid premade and processed foods. These tend to be higher in sodium, added sugar, and fat.  Reduce your daily sodium intake. Most people with hypertension should eat less than 1,500 mg of sodium a day.  Limit alcohol intake to no more than 1 drink a day for nonpregnant women and 2 drinks a day for men. One drink equals 12 oz of beer, 5 oz of wine, or 1 oz of hard liquor. Lifestyle  Work with your health care provider to maintain a healthy body weight or to lose weight. Ask what an ideal weight is for you.  Get at least 30 minutes of exercise that causes your heart to beat faster (aerobic exercise) most days of the week. Activities may include walking, swimming, or biking.  Include exercise to strengthen your muscles (resistance exercise), such as pilates or lifting weights, as part of your weekly exercise routine. Try to do these types of exercises for 30 minutes at least 3 days a week.  Do not use any products that contain nicotine or tobacco, such as cigarettes and e-cigarettes. If you need help quitting, ask your health care provider.  Monitor your blood pressure at home  as told by your health care provider.  Keep all follow-up visits as told by your health care provider. This is important. Medicines  Take over-the-counter and prescription medicines only as told by your health care provider. Follow directions carefully. Blood pressure medicines must be taken as prescribed.  Do not skip doses of blood pressure medicine. Doing this puts you at risk for problems and can make the medicine less effective.  Ask your health care provider about side effects or reactions to medicines that you should watch for. Contact a health care provider if:  You think you are having a reaction to a medicine you are taking.  You have headaches that keep coming back (recurring).  You feel dizzy.  You have swelling in your ankles.  You have trouble with your vision. Get help right away if:  You develop a severe headache or confusion.  You have unusual weakness or numbness.  You feel faint.  You have severe pain in your chest or abdomen.  You vomit repeatedly.  You have trouble breathing. Summary  Hypertension is when the  force of blood pumping through your arteries is too strong. If this condition is not controlled, it may put you at risk for serious complications.  Your personal target blood pressure may vary depending on your medical conditions, your age, and other factors. For most people, a normal blood pressure is less than 120/80.  Hypertension is treated with lifestyle changes, medicines, or a combination of both. Lifestyle changes include weight loss, eating a healthy, low-sodium diet, exercising more, and limiting alcohol. This information is not intended to replace advice given to you by your health care provider. Make sure you discuss any questions you have with your health care provider. Document Released: 06/07/2005 Document Revised: 05/05/2016 Document Reviewed: 05/05/2016 Elsevier Interactive Patient Education  [employer].

## 2018-04-03 NOTE — Assessment & Plan Note (Signed)
Well controlled, no changes to meds. Encouraged heart healthy diet such as the DASH diet and exercise as tolerated.  °

## 2018-04-03 NOTE — Assessment & Plan Note (Signed)
Encouraged heart healthy diet, increase exercise, avoid trans fats, consider a krill oil cap daily 

## 2018-04-04 ENCOUNTER — Ambulatory Visit (HOSPITAL_BASED_OUTPATIENT_CLINIC_OR_DEPARTMENT_OTHER)
Admission: RE | Admit: 2018-04-04 | Discharge: 2018-04-04 | Disposition: A | Discharge status: Home / Self Care | Payer: Medicare HMO | Source: Ambulatory Visit | Attending: Family Medicine | Admitting: Family Medicine

## 2018-04-04 ENCOUNTER — Encounter (HOSPITAL_BASED_OUTPATIENT_CLINIC_OR_DEPARTMENT_OTHER): Payer: Self-pay

## 2018-04-04 DIAGNOSIS — Z1231 Encounter for screening mammogram for malignant neoplasm of breast: Secondary | ICD-10-CM | POA: Insufficient documentation

## 2018-04-04 DIAGNOSIS — Z1239 Encounter for other screening for malignant neoplasm of breast: Secondary | ICD-10-CM

## 2018-04-04 IMAGING — MG DIGITAL SCREENING BILATERAL MAMMOGRAM WITH TOMO AND CAD
8 series · 8 of 24 positions shown · non-contrast
Comparison: Previous exam(s).

CLINICAL DATA: Screening.

EXAM:
DIGITAL SCREENING BILATERAL MAMMOGRAM WITH TOMO AND CAD

[R CC synth-2D]
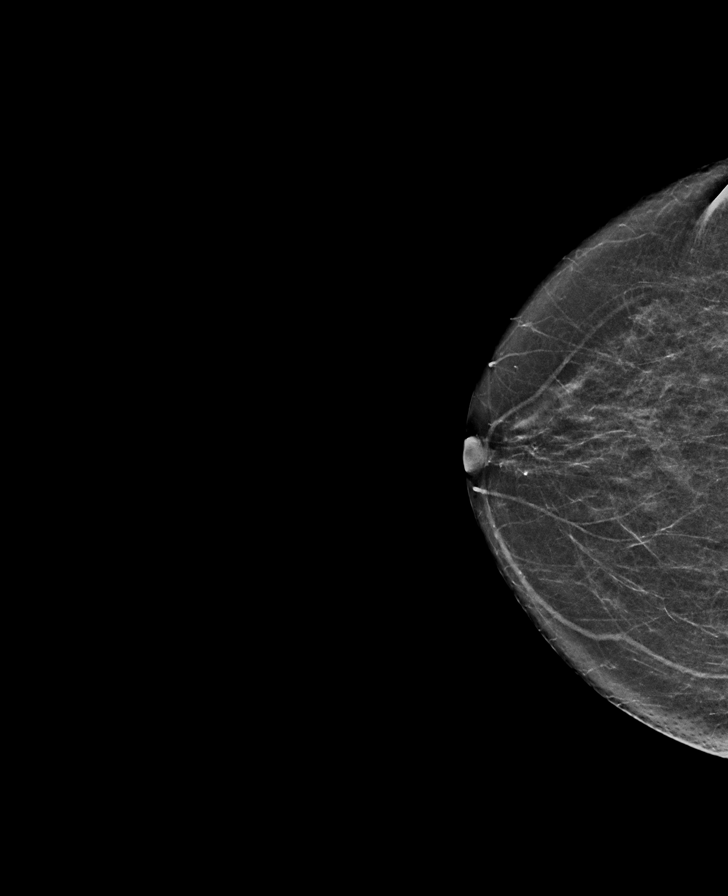

[R MLO synth-2D]
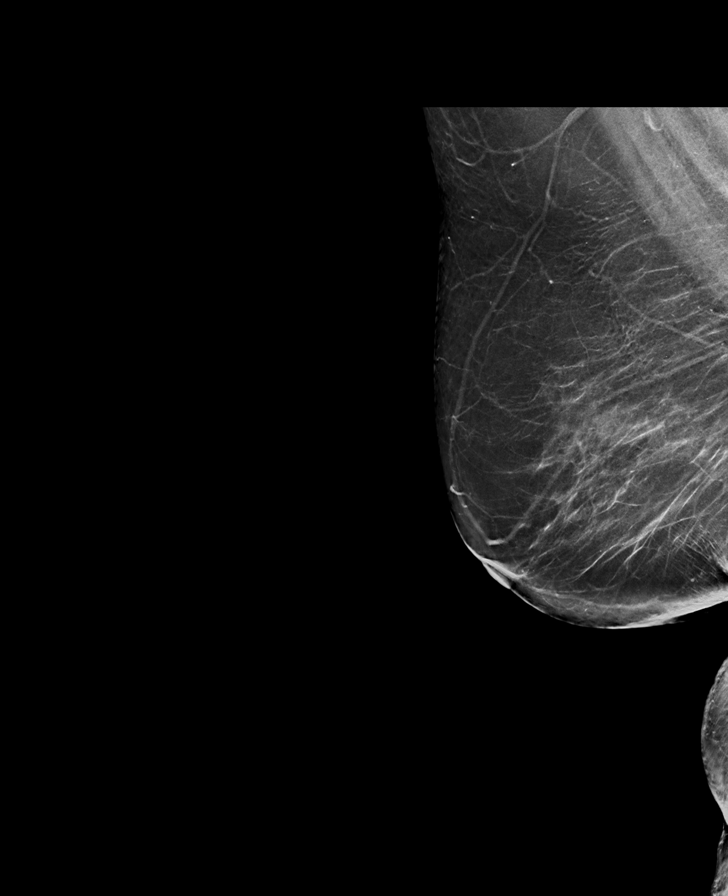

[L MLO synth-2D]
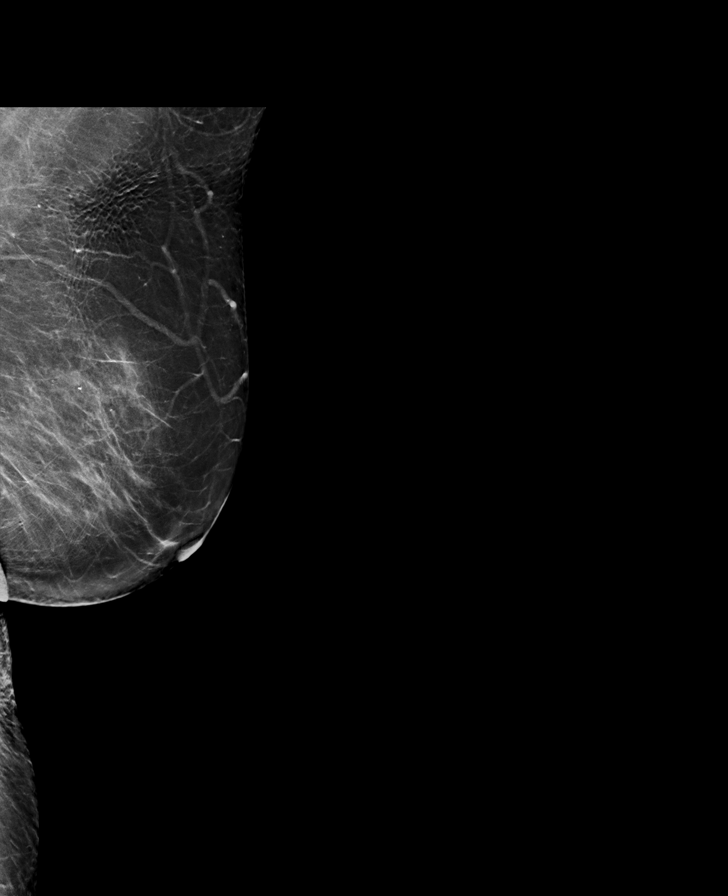

[L CC synth-2D]
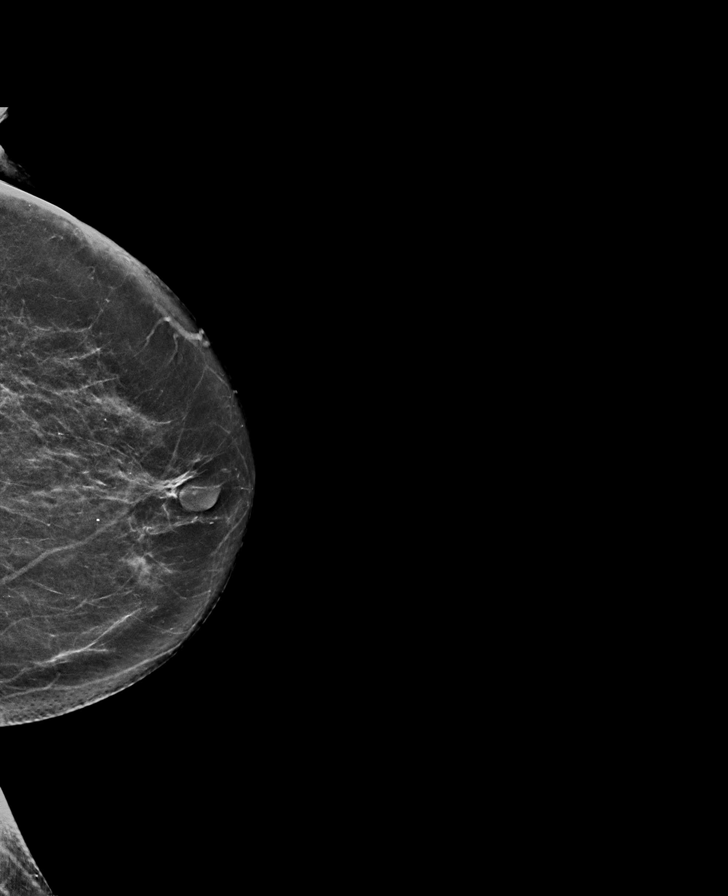

[R CC tomo · tomo slice 27/52.0]
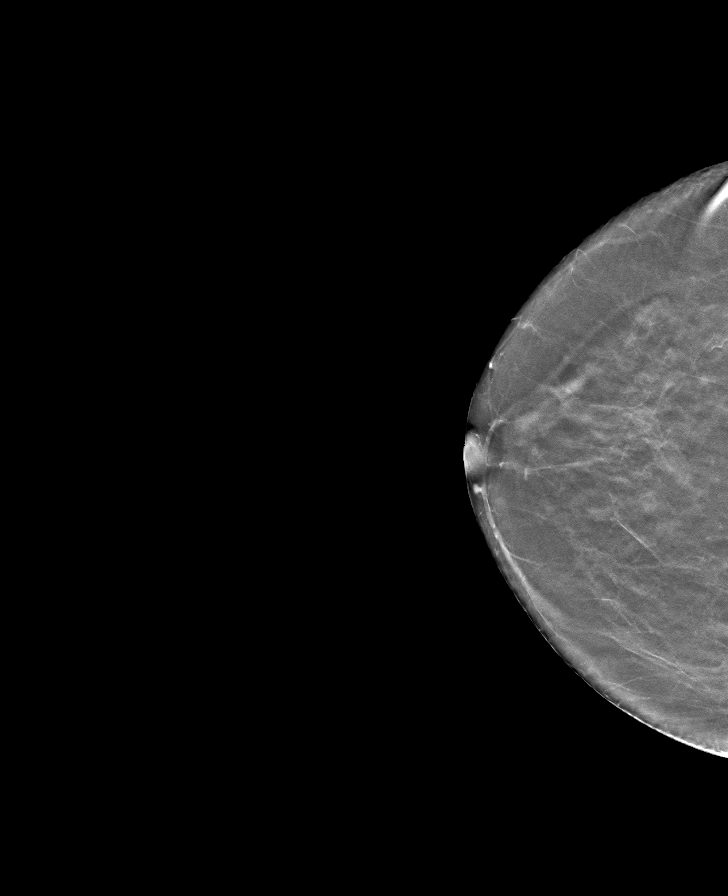

[L CC tomo · tomo slice 31/60.0]
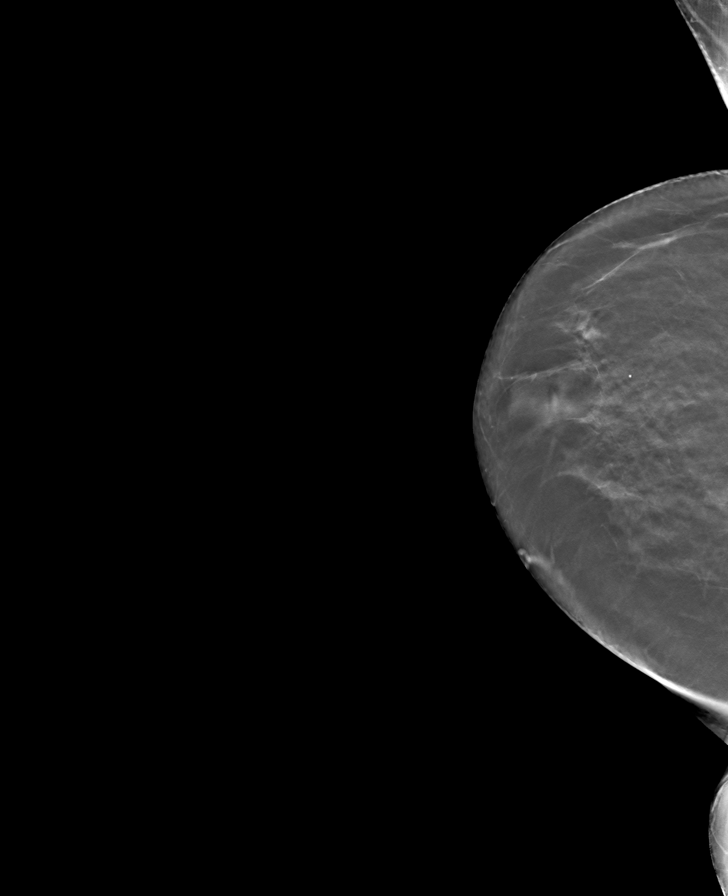

[L MLO tomo · tomo slice 37/72.0]
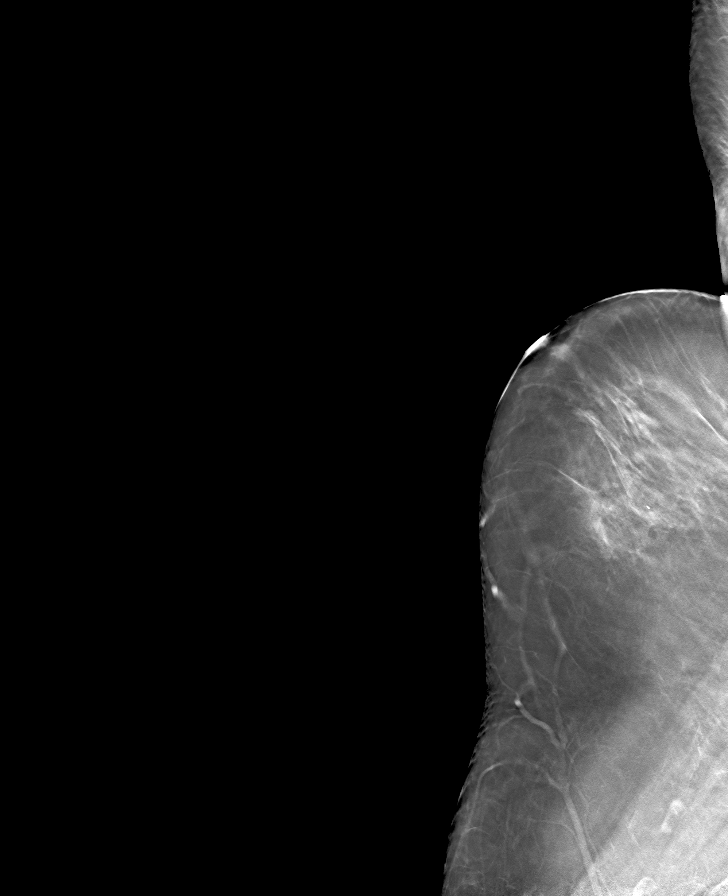

[R MLO tomo · tomo slice 37/74.0]
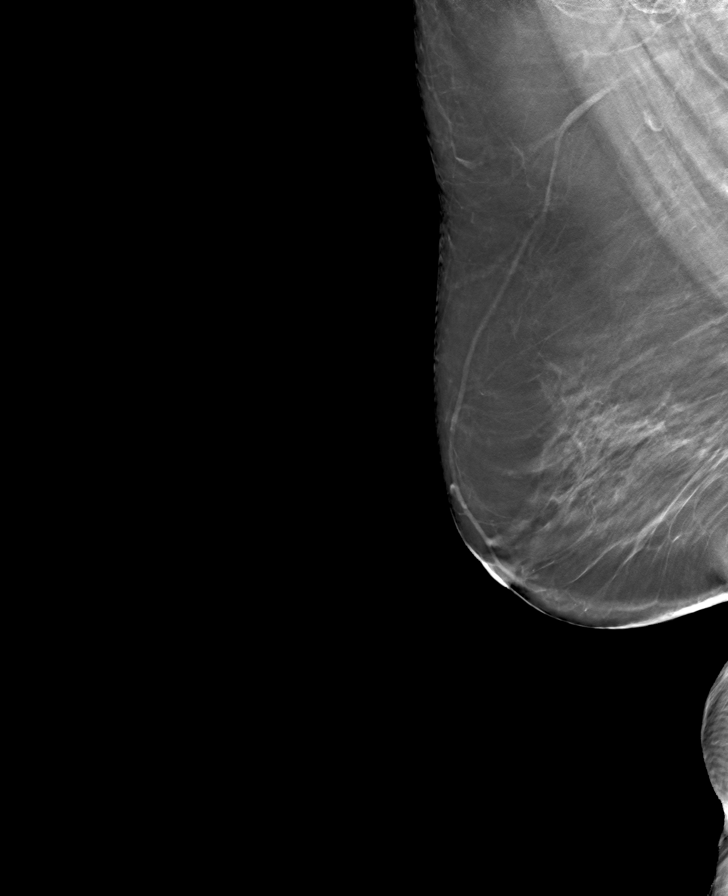

[8 of 24 positions shown; findings below may reference images not displayed]

ACR Breast Density Category b: There are scattered areas of
fibroglandular density.
FINDINGS: There are no findings suspicious for malignancy. Images were
processed with CAD.
IMPRESSION: No mammographic evidence of malignancy. A result letter of this
screening mammogram will be mailed directly to the patient.

RECOMMENDATION:
Screening mammogram in one year. (Code:[TQ])

BI-RADS CATEGORY  1: Negative.

## 2018-04-05 NOTE — Assessment & Plan Note (Signed)
Given PCV 23 today

## 2018-04-05 NOTE — Progress Notes (Signed)
Subjective:    Patient ID: Jasmine Wilson, female    DOB: 04/23/1941, 77 y.o.   MRN: [id number]  No chief complaint on file.   HPI Patient is in today for follow-up.  She feels well today.  No recent febrile illness or acute concerns.  She has had some intermittent trouble with peripheral edema but it is manageable today.  No acute concerns otherwise noted. Denies CP/palp/SOB/HA/congestion/fevers/GI or GU c/o. Taking meds as prescribed  Past Medical History:  Diagnosis Date  . Abdominal pain 03/02/2011  . Arthritis 03/02/2012  . Bronchitis 04/04/2011  . Cataract   . Chicken pox as a child  . Cough 01/29/2013  . Female bladder prolapse, acquired 01/29/2011  . Glaucoma 01/29/2011  . Hearing aid worn   . History of pneumonia   . HTN (hypertension) 01/29/2011  . Hyperlipidemia   . Measles as a child  . Meniere's disease   . Pedal edema 04/01/2016  . Preventative health care 01/29/2011  . Shingles   . Vaginosis 01/29/2013    Past Surgical History:  Procedure Laterality Date  . BREAST BIOPSY Left   . lump removed on left breast     clogged milk duct, benign  . TUBAL LIGATION      Family History  Problem Relation Age of Onset  . Breast cancer Mother   . Stroke Father   . Heart disease Father   . Hyperlipidemia Father   . Hypertension Father   . Breast cancer Sister 20  . Uterine cancer Maternal Grandmother        uterus    Social History   Socioeconomic History  . Marital status: Widowed    Spouse name: Not on file  . Number of children: 3  . Years of education: Not on file  . Highest education level: Not on file  Occupational History  . Occupation: Retired  Scientific laboratory technician  . Financial resource strain: Not on file  . Food insecurity:    Worry: Not on file    Inability: Not on file  . Transportation needs:    Medical: Not on file    Non-medical: Not on file  Tobacco Use  . Smoking status: Former Smoker    Types: Cigarettes    Last attempt to quit: 06/21/1970   Years since quitting: 47.8  . Smokeless tobacco: Never Used  . Tobacco comment: occasional smoker  Substance and Sexual Activity  . Alcohol use: No  . Drug use: No  . Sexual activity: Never  Lifestyle  . Physical activity:    Days per week: Not on file    Minutes per session: Not on file  . Stress: Not on file  Relationships  . Social connections:    Talks on phone: Not on file    Gets together: Not on file    Attends religious service: Not on file    Active member of club or organization: Not on file    Attends meetings of clubs or organizations: Not on file    Relationship status: Not on file  . Intimate partner violence:    Fear of current or ex partner: Not on file    Emotionally abused: Not on file    Physically abused: Not on file    Forced sexual activity: Not on file  Other Topics Concern  . Not on file  Social History Narrative  . Not on file    Outpatient Medications Prior to Visit  Medication Sig Dispense Refill  . cetirizine (  ZYRTEC) 10 MG tablet Take 1 tablet (10 mg total) by mouth daily as needed for allergies or rhinitis (cough). 90 tablet 1  . dorzolamide (TRUSOPT) 2 % ophthalmic solution Place 1 drop into both eyes 2 (two) times daily.     Marland Kitchen latanoprost (XALATAN) 0.005 % ophthalmic solution Place 1 drop into both eyes at bedtime.     . timolol (BETIMOL) 0.5 % ophthalmic solution Place 1 drop into both eyes 2 (two) times daily.    Marland Kitchen atorvastatin (LIPITOR) 10 MG tablet TAKE 1 TABLET EVERY DAY 90 tablet 1  . metoprolol succinate (TOPROL-XL) 100 MG 24 hr tablet TAKE 1 TABLET DAILY. TAKE WITH OR IMMEDIATELY FOLLOWING A MEAL 90 tablet 1  . ondansetron (ZOFRAN) 4 MG tablet Take 1 tablet (4 mg total) by mouth every 8 (eight) hours as needed for nausea or vomiting. 20 tablet 1  . scopolamine (TRANSDERM-SCOP, 1.5 MG,) 1 MG/3DAYS Place 1 patch (1.5 mg total) onto the skin every 3 (three) days. 5 patch 0   No facility-administered medications prior to visit.      Allergies  Allergen Reactions  . Codeine Other (See Comments)    Makes feel funny    Review of Systems  Constitutional: Negative for fever and malaise/fatigue.  HENT: Negative for congestion.   Eyes: Negative for blurred vision.  Respiratory: Negative for shortness of breath.   Cardiovascular: Positive for leg swelling. Negative for chest pain and palpitations.  Gastrointestinal: Negative for abdominal pain, blood in stool and nausea.  Genitourinary: Negative for dysuria and frequency.  Musculoskeletal: Negative for falls.  Skin: Negative for rash.  Neurological: Negative for dizziness, loss of consciousness and headaches.  Endo/Heme/Allergies: Negative for environmental allergies.  Psychiatric/Behavioral: Negative for depression. The patient is not nervous/anxious.        Objective:    Physical Exam  Constitutional: She is oriented to person, place, and time. She appears well-developed and well-nourished. No distress.  HENT:  Head: Normocephalic and atraumatic.  Nose: Nose normal.  Eyes: Right eye exhibits no discharge. Left eye exhibits no discharge.  Neck: Normal range of motion. Neck supple.  Cardiovascular: Normal rate and regular rhythm.  No murmur heard. Pulmonary/Chest: Effort normal and breath sounds normal.  Abdominal: Soft. Bowel sounds are normal. There is no tenderness.  Musculoskeletal: She exhibits no edema.  Neurological: She is alert and oriented to person, place, and time.  Skin: Skin is warm and dry.  Psychiatric: She has a normal mood and affect.  Nursing note and vitals reviewed.   BP 124/76 (BP Location: Left Arm, Patient Position: Sitting, Cuff Size: Normal)   Pulse 82   Temp 97.7 F (36.5 C) (Oral)   Resp 18   Wt 182 lb 3.2 oz (82.6 kg)   SpO2 96%   BMI 27.70 kg/m  Wt Readings from Last 3 Encounters:  04/03/18 182 lb 3.2 oz (82.6 kg)  02/17/18 183 lb (83 kg)  10/03/17 178 lb 12.8 oz (81.1 kg)     Lab Results  Component Value  Date   WBC 9.0 10/03/2017   HGB 15.3 (H) 10/03/2017   HCT 45.8 10/03/2017   PLT 320.0 10/03/2017   GLUCOSE 94 10/03/2017   CHOL 158 10/03/2017   TRIG 182.0 (H) 10/03/2017   HDL 56.10 10/03/2017   LDLDIRECT 82.0 04/01/2016   LDLCALC 66 10/03/2017   ALT 12 10/03/2017   AST 15 10/03/2017   NA 137 10/03/2017   K 5.1 10/03/2017   CL 101 10/03/2017   CREATININE  0.77 10/03/2017   BUN 18 10/03/2017   CO2 28 10/03/2017   TSH 1.49 10/03/2017    Lab Results  Component Value Date   TSH 1.49 10/03/2017   Lab Results  Component Value Date   WBC 9.0 10/03/2017   HGB 15.3 (H) 10/03/2017   HCT 45.8 10/03/2017   MCV 89.6 10/03/2017   PLT 320.0 10/03/2017   Lab Results  Component Value Date   NA 137 10/03/2017   K 5.1 10/03/2017   CO2 28 10/03/2017   GLUCOSE 94 10/03/2017   BUN 18 10/03/2017   CREATININE 0.77 10/03/2017   BILITOT 0.5 10/03/2017   ALKPHOS 87 10/03/2017   AST 15 10/03/2017   ALT 12 10/03/2017   PROT 7.2 10/03/2017   ALBUMIN 4.3 10/03/2017   CALCIUM 10.2 10/03/2017   GFR 77.34 10/03/2017   Lab Results  Component Value Date   CHOL 158 10/03/2017   Lab Results  Component Value Date   HDL 56.10 10/03/2017   Lab Results  Component Value Date   LDLCALC 66 10/03/2017   Lab Results  Component Value Date   TRIG 182.0 (H) 10/03/2017   Lab Results  Component Value Date   CHOLHDL 3 10/03/2017   No results found for: HGBA1C     Assessment & Plan:   Problem List Items Addressed This Visit    Hyperlipidemia    Encouraged heart healthy diet, increase exercise, avoid trans fats, consider a krill oil cap daily      Relevant Medications   atorvastatin (LIPITOR) 10 MG tablet   metoprolol succinate (TOPROL-XL) 100 MG 24 hr tablet   Preventative health care    Given PCV 23 today      HTN (hypertension)    Well controlled, no changes to meds. Encouraged heart healthy diet such as the DASH diet and exercise as tolerated.       Relevant Medications    atorvastatin (LIPITOR) 10 MG tablet   metoprolol succinate (TOPROL-XL) 100 MG 24 hr tablet   Pedal edema    Was flared on recent trip of 50 days on tour buses and cruises. So encouraged to increase elevation of feet, wear compression hose and minimize sodium       Other Visit Diagnoses    Need for 23-polyvalent pneumococcal polysaccharide vaccine    -  Primary   Relevant Orders   Pneumococcal polysaccharide vaccine 23-valent greater than or equal to 2yo subcutaneous/IM (Completed)      I have discontinued Stanton Kidney E. Wynns's ondansetron and scopolamine. I have also changed her atorvastatin. Additionally, I am having her maintain her latanoprost, dorzolamide, timolol, cetirizine, and metoprolol succinate.  Meds ordered this encounter  Medications  . atorvastatin (LIPITOR) 10 MG tablet    Sig: Take 1 tablet (10 mg total) by mouth daily.    Dispense:  90 tablet    Refill:  1  . metoprolol succinate (TOPROL-XL) 100 MG 24 hr tablet    Sig: TAKE 1 TABLET DAILY. TAKE WITH OR IMMEDIATELY FOLLOWING A MEAL    Dispense:  90 tablet    Refill:  1     Penni Homans, MD

## 2018-06-01 DIAGNOSIS — H401131 Primary open-angle glaucoma, bilateral, mild stage: Secondary | ICD-10-CM | POA: Diagnosis not present

## 2018-07-07 DIAGNOSIS — H401131 Primary open-angle glaucoma, bilateral, mild stage: Secondary | ICD-10-CM | POA: Diagnosis not present

## 2018-07-18 ENCOUNTER — Telehealth: Payer: Self-pay | Admitting: *Deleted

## 2018-07-18 NOTE — Telephone Encounter (Signed)
Received Eye Exam Report from [place]. OD, with concerns and requests for patient appointment to have Carotid Artery Study; forwarded to provider/SLS 01/28

## 2018-08-09 DIAGNOSIS — H33321 Round hole, right eye: Secondary | ICD-10-CM | POA: Diagnosis not present

## 2018-08-09 DIAGNOSIS — H43813 Vitreous degeneration, bilateral: Secondary | ICD-10-CM | POA: Diagnosis not present

## 2018-08-23 ENCOUNTER — Other Ambulatory Visit: Payer: Self-pay | Admitting: Family Medicine

## 2018-08-23 MED ORDER — METOPROLOL SUCCINATE ER 100 MG PO TB24: ORAL_TABLET | ORAL | 1 refills | Status: DC

## 2018-08-23 MED ORDER — ATORVASTATIN CALCIUM 10 MG PO TABS: 10.0000 mg | ORAL_TABLET | Freq: Every day | ORAL | 1 refills | Status: DC

## 2018-08-23 NOTE — Telephone Encounter (Signed)
Copied from [place] [id number]. Topic: Quick Communication - Rx Refill/Question >> Aug 23, 2018  2:43 PM Bea Graff, NT wrote: Medication: atorvastatin (LIPITOR) 10 MG tablet and metoprolol succinate (TOPROL-XL) 100 MG 24 hr tablet  Has the patient contacted their pharmacy? Yes.   (Agent: If no, request that the patient contact the pharmacy for the refill.) (Agent: If yes, when and what did the pharmacy advise?)  Preferred Pharmacy (with phone number or street name): [facility], [place] [phone] (Phone) [phone] (Fax)    Agent: Please be advised that RX refills may take up to 3 business days. We ask that you follow-up with your pharmacy.

## 2018-08-25 DIAGNOSIS — H33321 Round hole, right eye: Secondary | ICD-10-CM | POA: Diagnosis not present

## 2018-09-18 ENCOUNTER — Telehealth: Payer: Self-pay | Admitting: Family Medicine

## 2018-09-18 DIAGNOSIS — E782 Mixed hyperlipidemia: Secondary | ICD-10-CM

## 2018-09-18 DIAGNOSIS — H539 Unspecified visual disturbance: Secondary | ICD-10-CM

## 2018-09-18 DIAGNOSIS — I10 Essential (primary) hypertension: Secondary | ICD-10-CM

## 2018-09-18 NOTE — Telephone Encounter (Signed)
COPIED FROM LAST TELEPHONE NOTE: "Received Eye Exam Report from [place]. OD, with concerns and requests for patient appointment to have Carotid Artery Study; forwarded to provider/SLS 01/28"  Patient was rescheduled for her annual physical and was concerned about her carotid artery and was wondering how to proceed with an appointment for that. Please advise.

## 2018-09-19 NOTE — Telephone Encounter (Signed)
How do we follow up about this referral  Please advise

## 2018-10-03 ENCOUNTER — Encounter: Payer: Medicare HMO | Admitting: Family Medicine

## 2018-11-02 ENCOUNTER — Ambulatory Visit (HOSPITAL_BASED_OUTPATIENT_CLINIC_OR_DEPARTMENT_OTHER)
Admission: RE | Admit: 2018-11-02 | Discharge: 2018-11-02 | Disposition: A | Discharge status: Home / Self Care | Payer: Medicare Other | Source: Ambulatory Visit | Attending: Family Medicine | Admitting: Family Medicine

## 2018-11-02 ENCOUNTER — Other Ambulatory Visit: Payer: Self-pay

## 2018-11-02 DIAGNOSIS — H539 Unspecified visual disturbance: Secondary | ICD-10-CM | POA: Diagnosis not present

## 2018-11-02 DIAGNOSIS — I10 Essential (primary) hypertension: Secondary | ICD-10-CM

## 2018-11-02 DIAGNOSIS — E782 Mixed hyperlipidemia: Secondary | ICD-10-CM | POA: Insufficient documentation

## 2018-11-02 DIAGNOSIS — I6523 Occlusion and stenosis of bilateral carotid arteries: Secondary | ICD-10-CM | POA: Diagnosis not present

## 2018-11-02 IMAGING — US BILATERAL CAROTID DUPLEX ULTRASOUND
1 series · 13 of 24 positions shown · non-contrast
Comparison: None.

CLINICAL DATA: Visual change. History of hypertension and
hyperlipidemia.

EXAM:
BILATERAL CAROTID DUPLEX ULTRASOUND
TECHNIQUE: Gray scale imaging, color Doppler and duplex ultrasound were
performed of bilateral carotid and vertebral arteries in the neck.

[Series 1: bilateral carotid duplex ultrasound · 13 of 76 slices shown]
[im 1/76]
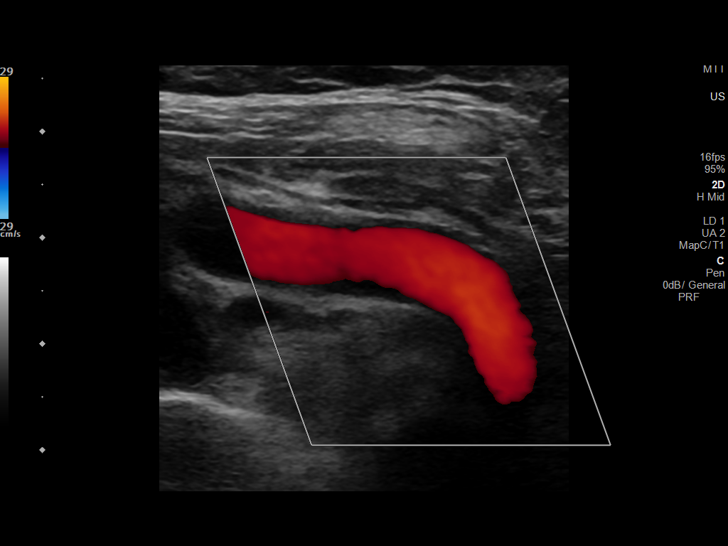
[im 7/76]
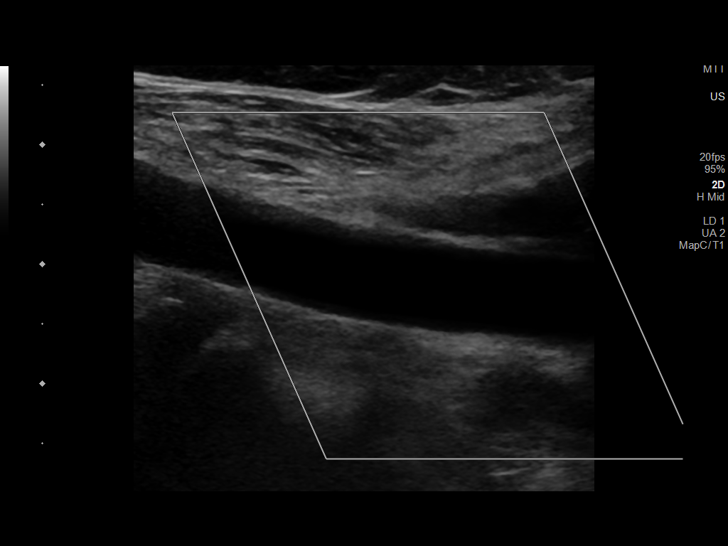
[im 14/76]
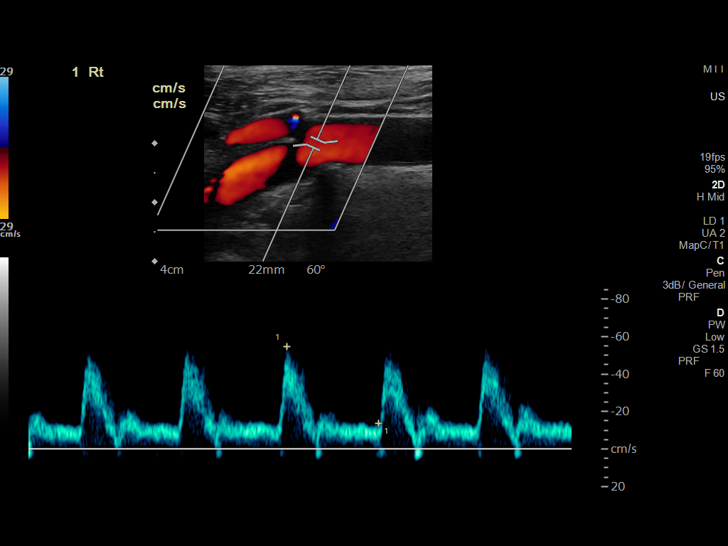
[im 20/76]
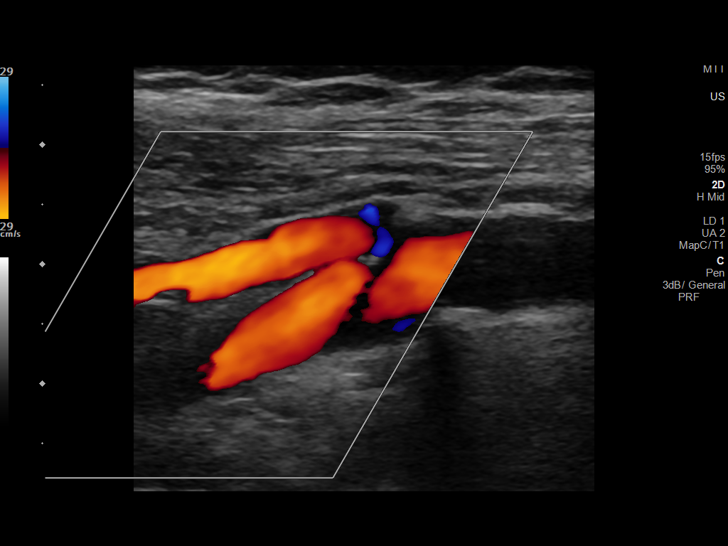
[im 27/76]
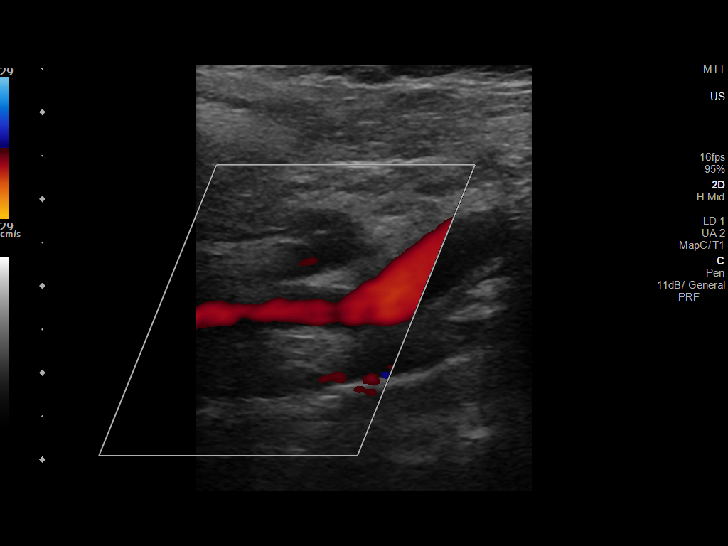
[im 33/76]
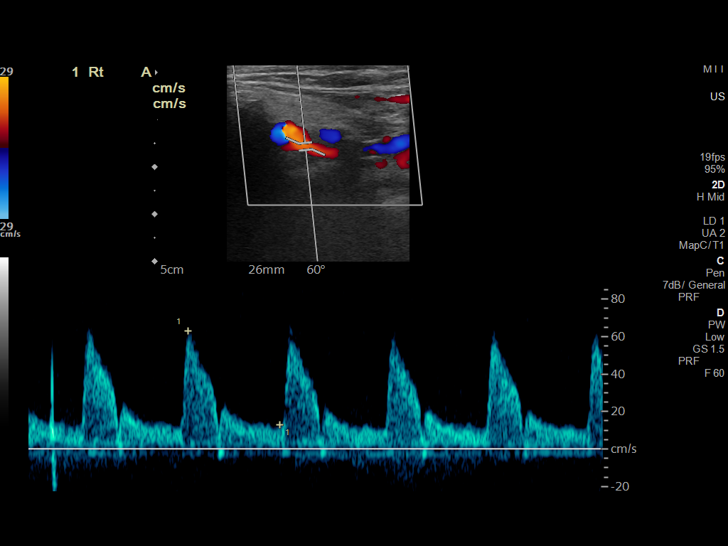
[im 40/76]
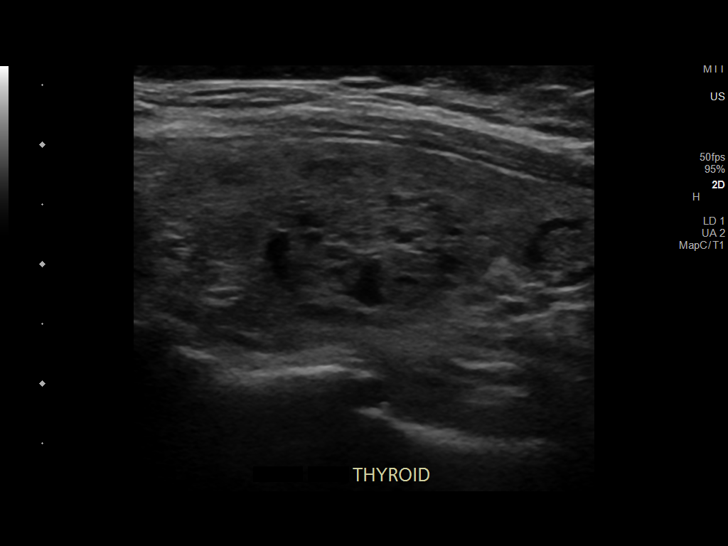
[im 43/76]
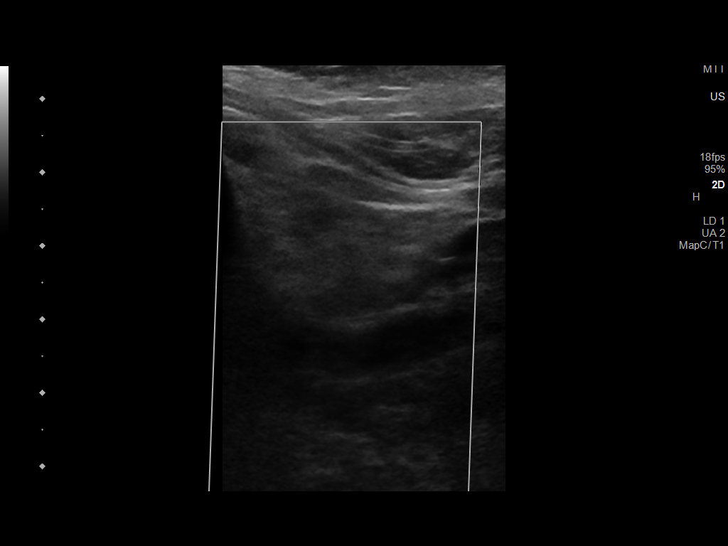
[im 49/76]
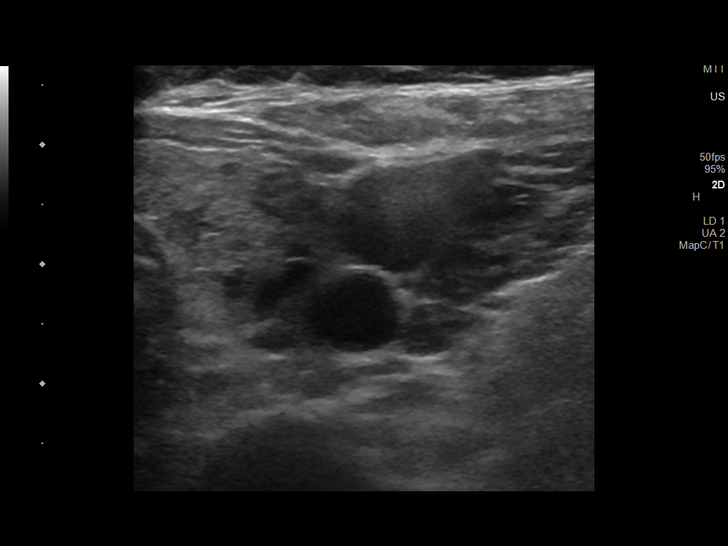
[im 56/76]
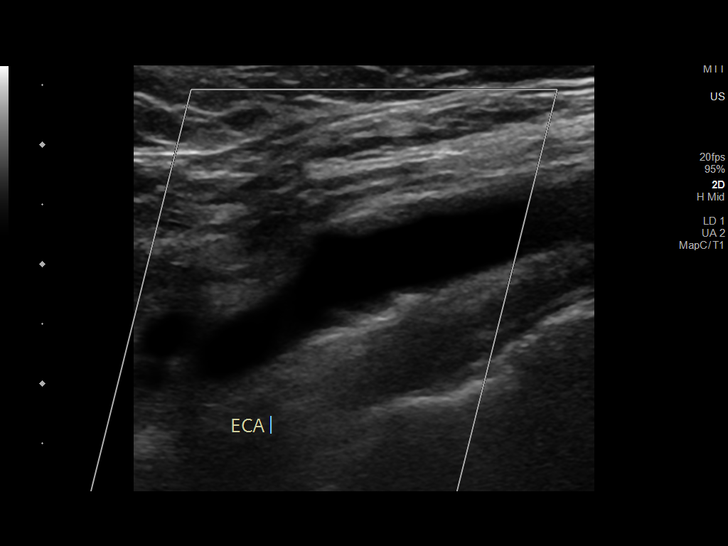
[im 62/76]
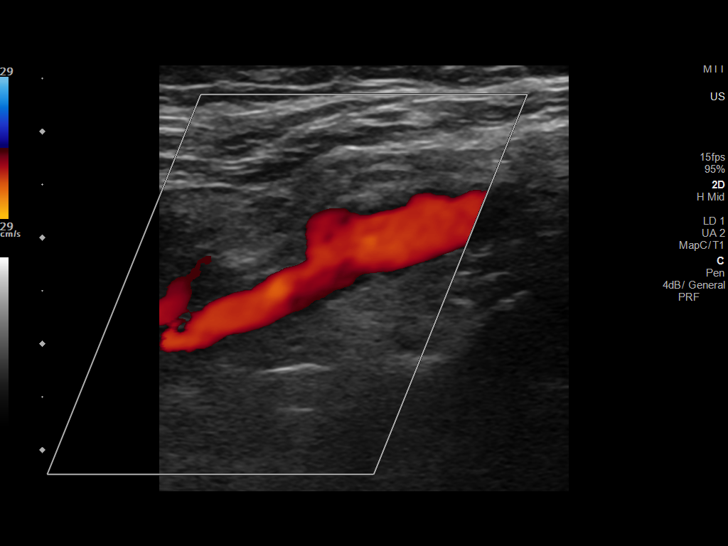
[im 69/76]
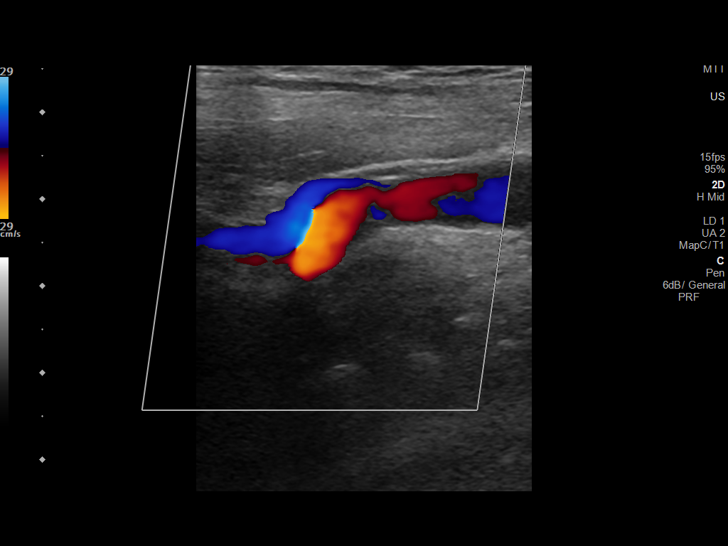
[im 76/76]
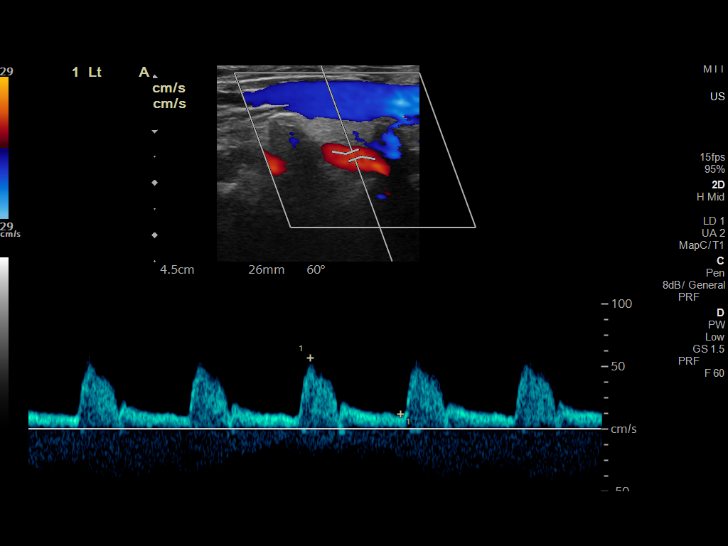

[13 of 24 positions shown; findings below may reference images not displayed]

FINDINGS: Criteria: Quantification of carotid stenosis is based on velocity
parameters that correlate the residual internal carotid diameter
with NASCET-based stenosis levels, using the diameter of the distal
internal carotid lumen as the denominator for stenosis measurement.

The following velocity measurements were obtained:

RIGHT

ICA: 81/80 cm/sec

CCA: 103/17 cm/sec

SYSTOLIC ICA/CCA RATIO:

ECA: 91 cm/sec

LEFT

ICA: 149/34 cm/sec

CCA: 103/15 cm/sec

SYSTOLIC ICA/CCA RATIO:

ECA: 81 cm/sec

RIGHT CAROTID ARTERY: There is a minimal amount of eccentric
echogenic plaque within the right carotid bulb (image 17), extending
to involve the origin and proximal aspects of the right internal
carotid artery (image 25), not resulting in elevated peak systolic
velocities within the interrogated course of the right internal
carotid artery to suggest a hemodynamically significant stenosis.

RIGHT VERTEBRAL ARTERY:  Antegrade flow

LEFT CAROTID ARTERY: There is a minimal to moderate amount of
eccentric mixed echogenic plaque within the left carotid bulb (image
57), extending to involve the origin and proximal aspect the left
internal carotid artery (image 62 which results in borderline
elevated peak systolic velocities within the mid aspect the left
internal carotid artery. Greatest acquired peak systolic velocity
within the mid left ICA measures 149 centimeters/second (image 71),
though note, acquired peak systolic velocity could be artifactually
elevated due to sampling at a location of turbulent flow.

LEFT VERTEBRAL ARTERY:  Antegrade Flow

Upper extremity blood pressures: RIGHT: 145/68 LEFT: 144/68

Note is made of an approximately 1.2 x 1.2 x 1.8 cm
spongiform/benign appearing nodule within the left lobe of the
thyroid.
IMPRESSION: 1. Minimal to moderate amount of left-sided atherosclerotic plaque
results in elevated peak systolic velocities within the right
internal carotid artery compatible with the lower end of the 50-69%
luminal narrowing range. Further evaluation with CTA could performed
as clinically indicated.
2. Minimal amount of right-sided atherosclerotic plaque, not
resulting in hemodynamically significant stenosis.

## 2019-01-02 ENCOUNTER — Encounter: Payer: Medicare HMO | Admitting: Family Medicine

## 2019-01-12 ENCOUNTER — Other Ambulatory Visit: Payer: Self-pay | Admitting: Family Medicine

## 2019-02-15 ENCOUNTER — Telehealth: Payer: Self-pay | Admitting: Family Medicine

## 2019-02-15 NOTE — Telephone Encounter (Signed)
Called patient to schedule AWV, but no answer. Will try to call patient back at a later time. SF 

## 2019-03-27 ENCOUNTER — Ambulatory Visit (INDEPENDENT_AMBULATORY_CARE_PROVIDER_SITE_OTHER): Payer: Medicare Other | Admitting: Family Medicine

## 2019-03-27 ENCOUNTER — Other Ambulatory Visit: Payer: Self-pay

## 2019-03-27 VITALS — BP 118/82 | HR 92 | Temp 96.2°F | Resp 18 | Wt 198.0 lb

## 2019-03-27 DIAGNOSIS — I10 Essential (primary) hypertension: Secondary | ICD-10-CM | POA: Diagnosis not present

## 2019-03-27 DIAGNOSIS — E782 Mixed hyperlipidemia: Secondary | ICD-10-CM

## 2019-03-27 DIAGNOSIS — L578 Other skin changes due to chronic exposure to nonionizing radiation: Secondary | ICD-10-CM | POA: Diagnosis not present

## 2019-03-27 DIAGNOSIS — Z0000 Encounter for general adult medical examination without abnormal findings: Secondary | ICD-10-CM | POA: Diagnosis not present

## 2019-03-27 DIAGNOSIS — D582 Other hemoglobinopathies: Secondary | ICD-10-CM | POA: Diagnosis not present

## 2019-03-27 LAB — COMPREHENSIVE METABOLIC PANEL
ALT: 14 U/L (ref 0–35)
AST: 17 U/L (ref 0–37)
Albumin: 4.3 g/dL (ref 3.5–5.2)
Alkaline Phosphatase: 92 U/L (ref 39–117)
BUN: 16 mg/dL (ref 6–23)
CO2: 27 mEq/L (ref 19–32)
Calcium: 9.6 mg/dL (ref 8.4–10.5)
Chloride: 103 mEq/L (ref 96–112)
Creatinine, Ser: 0.83 mg/dL (ref 0.40–1.20)
GFR: 66.47 mL/min (ref >60.00)
Glucose, Bld: 93 mg/dL (ref 70–99)
Potassium: 4.8 mEq/L (ref 3.5–5.1)
Sodium: 138 mEq/L (ref 135–145)
Total Bilirubin: 0.6 mg/dL (ref 0.2–1.2)
Total Protein: 6.4 g/dL (ref 6.0–8.3)

## 2019-03-27 LAB — CBC
HCT: 45.7 % (ref 36.0–46.0)
Hemoglobin: 15.2 g/dL — ABNORMAL HIGH (ref 12.0–15.0)
MCHC: 33.2 g/dL (ref 30.0–36.0)
MCV: 90.1 fl (ref 78.0–100.0)
Platelets: 264 10*3/uL (ref 150.0–400.0)
RBC: 5.07 Mil/uL (ref 3.87–5.11)
RDW: 14.5 % (ref 11.5–15.5)
WBC: 6.6 10*3/uL (ref 4.0–10.5)

## 2019-03-27 LAB — LIPID PANEL
Cholesterol: 169 mg/dL (ref 0–200)
HDL: 56 mg/dL (ref >39.00)
LDL Cholesterol: 81 mg/dL (ref 0–99)
NonHDL: 112.94
Total CHOL/HDL Ratio: 3
Triglycerides: 159 mg/dL — ABNORMAL HIGH (ref 0.0–149.0)
VLDL: 31.8 mg/dL (ref 0.0–40.0)

## 2019-03-27 LAB — TSH: TSH: 1.7 u[IU]/mL (ref 0.35–4.50)

## 2019-03-27 NOTE — Patient Instructions (Addendum)
[employer] Blood pres sure cuff, upper arm  Pulse oximeter  Weekly vitals  Multivitamin with minerals daily, Selenium, zinc, vitamin c and vitamin d   Preventive Care 78 Years and Older, Female Preventive care refers to lifestyle choices and visits with your health care provider that can promote health and wellness. This includes:  A yearly physical exam. This is also called an annual well check.  Regular dental and eye exams.  Immunizations.  Screening for certain conditions.  Healthy lifestyle choices, such as diet and exercise. What can I expect for my preventive care visit? Physical exam Your health care provider will check:  Height and weight. These may be used to calculate body mass index (BMI), which is a measurement that tells if you are at a healthy weight.  Heart rate and blood pressure.  Your skin for abnormal spots. Counseling Your health care provider may ask you questions about:  Alcohol, tobacco, and drug use.  Emotional well-being.  Home and relationship well-being.  Sexual activity.  Eating habits.  History of falls.  Memory and ability to understand (cognition).  Work and work Statistician.  Pregnancy and menstrual history. What immunizations do I need?  Influenza (flu) vaccine  This is recommended every year. Tetanus, diphtheria, and pertussis (Tdap) vaccine  You may need a Td booster every 10 years. Varicella (chickenpox) vaccine  You may need this vaccine if you have not already been vaccinated. Zoster (shingles) vaccine  You may need this after age 65. Pneumococcal conjugate (PCV13) vaccine  One dose is recommended after age 78. Pneumococcal polysaccharide (PPSV23) vaccine  One dose is recommended after age 78. Measles, mumps, and rubella (MMR) vaccine  You may need at least one dose of MMR if you were born in 1957 or later. You may also need a second dose. Meningococcal conjugate (MenACWY) vaccine  You may need this if you  have certain conditions. Hepatitis A vaccine  You may need this if you have certain conditions or if you travel or work in places where you may be exposed to hepatitis A. Hepatitis B vaccine  You may need this if you have certain conditions or if you travel or work in places where you may be exposed to hepatitis B. Haemophilus influenzae type b (Hib) vaccine  You may need this if you have certain conditions. You may receive vaccines as individual doses or as more than one vaccine together in one shot (combination vaccines). Talk with your health care provider about the risks and benefits of combination vaccines. What tests do I need? Blood tests  Lipid and cholesterol levels. These may be checked every 5 years, or more frequently depending on your overall health.  Hepatitis C test.  Hepatitis B test. Screening  Lung cancer screening. You may have this screening every year starting at age 78 if you have a 30-pack-year history of smoking and currently smoke or have quit within the past 15 years.  Colorectal cancer screening. All adults should have this screening starting at age 78 and continuing until age 15. Your health care provider may recommend screening at age 78 if you are at increased risk. You will have tests every 1-10 years, depending on your results and the type of screening test.  Diabetes screening. This is done by checking your blood sugar (glucose) after you have not eaten for a while (fasting). You may have this done every 1-3 years.  Mammogram. This may be done every 1-2 years. Talk with your health care provider about how  often you should have regular mammograms.  BRCA-related cancer screening. This may be done if you have a family history of breast, ovarian, tubal, or peritoneal cancers. Other tests  Sexually transmitted disease (STD) testing.  Bone density scan. This is done to screen for osteoporosis. You may have this done starting at age 78. Follow these  instructions at home: Eating and drinking  Eat a diet that includes fresh fruits and vegetables, whole grains, lean protein, and low-fat dairy products. Limit your intake of foods with high amounts of sugar, saturated fats, and salt.  Take vitamin and mineral supplements as recommended by your health care provider.  Do not drink alcohol if your health care provider tells you not to drink.  If you drink alcohol: ? Limit how much you have to 0-1 drink a day. ? Be aware of how much alcohol is in your drink. In the [place] one drink equals one 12 oz bottle of beer (355 mL), one 5 oz glass of wine (148 mL), or one 1 oz glass of hard liquor (44 mL). Lifestyle  Take daily care of your teeth and gums.  Stay active. Exercise for at least 30 minutes on 5 or more days each week.  Do not use any products that contain nicotine or tobacco, such as cigarettes, e-cigarettes, and chewing tobacco. If you need help quitting, ask your health care provider.  If you are sexually active, practice safe sex. Use a condom or other form of protection in order to prevent STIs (sexually transmitted infections).  Talk with your health care provider about taking a low-dose aspirin or statin. What's next?  Go to your health care provider once a year for a well check visit.  Ask your health care provider how often you should have your eyes and teeth checked.  Stay up to date on all vaccines. This information is not intended to replace advice given to you by your health care provider. Make sure you discuss any questions you have with your health care provider. Document Released: 07/04/2015 Document Revised: 06/01/2018 Document Reviewed: 06/01/2018 Elsevier Patient Education  2020 [employer]  

## 2019-03-27 NOTE — Assessment & Plan Note (Signed)
Encouraged heart healthy diet, increase exercise, avoid trans fats, consider a krill oil cap daily. Tolerating Atorvastatin 

## 2019-03-27 NOTE — Assessment & Plan Note (Signed)
Well controlled, no changes to meds. Encouraged heart healthy diet such as the DASH diet and exercise as tolerated.  °

## 2019-03-27 NOTE — Progress Notes (Signed)
Patient ID: Jasmine Wilson, female   DOB: 1941-04-14, 78 y.o.   MRN: [id number]   Subjective:    Patient ID: Jasmine Wilson, female    DOB: Feb 14, 1941, 78 y.o.   MRN: [id number]  No chief complaint on file.   HPI Patient is in today for annual preventative exam and follow up on chronic medical concerns including hypertension, hyperlipidemia, and more. No recent febrile illness or hospitalizations. No polyuria or polydipsia. Is maintaining quarantine well. She is trying to stay active and maintain a heart healthy diet. Denies CP/palp/SOB/HA/congestion/fevers/GI or GU c/o. Taking meds as prescribed  Past Medical History:  Diagnosis Date  . Abdominal pain 03/02/2011  . Arthritis 03/02/2012  . Bronchitis 04/04/2011  . Cataract   . Chicken pox as a child  . Cough 01/29/2013  . Female bladder prolapse, acquired 01/29/2011  . Glaucoma 01/29/2011  . Hearing aid worn   . History of pneumonia   . HTN (hypertension) 01/29/2011  . Hyperlipidemia   . Measles as a child  . Meniere's disease   . Pedal edema 04/01/2016  . Preventative health care 01/29/2011  . Shingles   . Vaginosis 01/29/2013    Past Surgical History:  Procedure Laterality Date  . BREAST BIOPSY Left   . lump removed on left breast     clogged milk duct, benign  . TUBAL LIGATION      Family History  Problem Relation Age of Onset  . Breast cancer Mother   . Stroke Father   . Heart disease Father   . Hyperlipidemia Father   . Hypertension Father   . Breast cancer Sister 82  . Uterine cancer Maternal Grandmother        uterus    Social History   Socioeconomic History  . Marital status: Widowed    Spouse name: Not on file  . Number of children: 3  . Years of education: Not on file  . Highest education level: Not on file  Occupational History  . Occupation: Retired  Scientific laboratory technician  . Financial resource strain: Not on file  . Food insecurity    Worry: Not on file    Inability: Not on file  . Transportation needs   Medical: Not on file    Non-medical: Not on file  Tobacco Use  . Smoking status: Former Smoker    Types: Cigarettes    Quit date: 06/21/1970    Years since quitting: 48.8  . Smokeless tobacco: Never Used  . Tobacco comment: occasional smoker  Substance and Sexual Activity  . Alcohol use: No  . Drug use: No  . Sexual activity: Never  Lifestyle  . Physical activity    Days per week: Not on file    Minutes per session: Not on file  . Stress: Not on file  Relationships  . Social Herbalist on phone: Not on file    Gets together: Not on file    Attends religious service: Not on file    Active member of club or organization: Not on file    Attends meetings of clubs or organizations: Not on file    Relationship status: Not on file  . Intimate partner violence    Fear of current or ex partner: Not on file    Emotionally abused: Not on file    Physically abused: Not on file    Forced sexual activity: Not on file  Other Topics Concern  . Not on file  Social History Narrative  .  Not on file    Outpatient Medications Prior to Visit  Medication Sig Dispense Refill  . atorvastatin (LIPITOR) 10 MG tablet TAKE 1 TABLET BY MOUTH  DAILY 90 tablet 3  . cetirizine (ZYRTEC) 10 MG tablet Take 1 tablet (10 mg total) by mouth daily as needed for allergies or rhinitis (cough). 90 tablet 1  . dorzolamide (TRUSOPT) 2 % ophthalmic solution Place 1 drop into both eyes 2 (two) times daily.     Marland Kitchen latanoprost (XALATAN) 0.005 % ophthalmic solution Place 1 drop into both eyes at bedtime.     . metoprolol succinate (TOPROL-XL) 100 MG 24 hr tablet TAKE 1 TABLET BY MOUTH  DAILY WITH OR IMMEDIATELY  FOLLOWING A MEAL 90 tablet 3  . timolol (BETIMOL) 0.5 % ophthalmic solution Place 1 drop into both eyes 2 (two) times daily.     No facility-administered medications prior to visit.     Allergies  Allergen Reactions  . Codeine Other (See Comments)    Makes feel funny    Review of Systems   Constitutional: Negative for chills, fever and malaise/fatigue.  HENT: Negative for congestion and hearing loss.   Eyes: Negative for discharge.  Respiratory: Negative for cough, sputum production and shortness of breath.   Cardiovascular: Negative for chest pain, palpitations and leg swelling.  Gastrointestinal: Negative for abdominal pain, blood in stool, constipation, diarrhea, heartburn, nausea and vomiting.  Genitourinary: Negative for dysuria, frequency, hematuria and urgency.  Musculoskeletal: Negative for back pain, falls and myalgias.  Skin: Negative for rash.  Neurological: Negative for dizziness, sensory change, loss of consciousness, weakness and headaches.  Endo/Heme/Allergies: Negative for environmental allergies. Does not bruise/bleed easily.  Psychiatric/Behavioral: Negative for depression and suicidal ideas. The patient is not nervous/anxious and does not have insomnia.        Objective:    Physical Exam Constitutional:      General: She is not in acute distress.    Appearance: She is well-developed.  HENT:     Head: Normocephalic and atraumatic.  Eyes:     Conjunctiva/sclera: Conjunctivae normal.  Neck:     Musculoskeletal: Neck supple.     Thyroid: No thyromegaly.  Cardiovascular:     Rate and Rhythm: Normal rate and regular rhythm.     Heart sounds: Normal heart sounds. No murmur.  Pulmonary:     Effort: Pulmonary effort is normal. No respiratory distress.     Breath sounds: Normal breath sounds.  Abdominal:     General: Bowel sounds are normal. There is no distension.     Palpations: Abdomen is soft. There is no mass.     Tenderness: There is no abdominal tenderness.  Lymphadenopathy:     Cervical: No cervical adenopathy.  Skin:    General: Skin is warm and dry.  Neurological:     Mental Status: She is alert and oriented to person, place, and time.  Psychiatric:        Behavior: Behavior normal.     BP 118/82 (BP Location: Left Arm, Patient  Position: Sitting, Cuff Size: Large)   Pulse 92   Temp (!) 96.2 F (35.7 C) (Temporal)   Resp 18   Wt 198 lb (89.8 kg)   SpO2 97%   BMI 30.11 kg/m  Wt Readings from Last 3 Encounters:  03/27/19 198 lb (89.8 kg)  04/03/18 182 lb 3.2 oz (82.6 kg)  02/17/18 183 lb (83 kg)    Diabetic Foot Exam - Simple   No data filed  Lab Results  Component Value Date   WBC 6.6 03/27/2019   HGB 15.2 (H) 03/27/2019   HCT 45.7 03/27/2019   PLT 264.0 03/27/2019   GLUCOSE 93 03/27/2019   CHOL 169 03/27/2019   TRIG 159.0 (H) 03/27/2019   HDL 56.00 03/27/2019   LDLDIRECT 82.0 04/01/2016   LDLCALC 81 03/27/2019   ALT 14 03/27/2019   AST 17 03/27/2019   NA 138 03/27/2019   K 4.8 03/27/2019   CL 103 03/27/2019   CREATININE 0.83 03/27/2019   BUN 16 03/27/2019   CO2 27 03/27/2019   TSH 1.70 03/27/2019    Lab Results  Component Value Date   TSH 1.70 03/27/2019   Lab Results  Component Value Date   WBC 6.6 03/27/2019   HGB 15.2 (H) 03/27/2019   HCT 45.7 03/27/2019   MCV 90.1 03/27/2019   PLT 264.0 03/27/2019   Lab Results  Component Value Date   NA 138 03/27/2019   K 4.8 03/27/2019   CO2 27 03/27/2019   GLUCOSE 93 03/27/2019   BUN 16 03/27/2019   CREATININE 0.83 03/27/2019   BILITOT 0.6 03/27/2019   ALKPHOS 92 03/27/2019   AST 17 03/27/2019   ALT 14 03/27/2019   PROT 6.4 03/27/2019   ALBUMIN 4.3 03/27/2019   CALCIUM 9.6 03/27/2019   GFR 66.47 03/27/2019   Lab Results  Component Value Date   CHOL 169 03/27/2019   Lab Results  Component Value Date   HDL 56.00 03/27/2019   Lab Results  Component Value Date   LDLCALC 81 03/27/2019   Lab Results  Component Value Date   TRIG 159.0 (H) 03/27/2019   Lab Results  Component Value Date   CHOLHDL 3 03/27/2019   No results found for: HGBA1C     Assessment & Plan:   Problem List Items Addressed This Visit    Hyperlipidemia    Encouraged heart healthy diet, increase exercise, avoid trans fats, consider a krill  oil cap daily. Tolerating Atorvastatin      Relevant Orders   Lipid panel (Completed)   Preventative health care    Patient encouraged to maintain heart healthy diet, regular exercise, adequate sleep. Consider daily probiotics. Take medications as prescribed. Declines colonoscopy has chosen to never have one. Labs ordered and reviewed.       HTN (hypertension)    Well controlled, no changes to meds. Encouraged heart healthy diet such as the DASH diet and exercise as tolerated.       Relevant Orders   CBC (Completed)   Comprehensive metabolic panel (Completed)   TSH (Completed)   Sun-damaged skin - Primary    Referred to dermatology for further consideration and evluation      Relevant Orders   Ambulatory referral to Dermatology   Abnormal hemoglobin (Hgb) (HCC)    Mildly elevated. Hydrate well and continue to monitor         I am having Jasmine Wilson maintain her latanoprost, dorzolamide, timolol, cetirizine, atorvastatin, and metoprolol succinate.  No orders of the defined types were placed in this encounter.    Penni Homans, MD

## 2019-03-27 NOTE — Assessment & Plan Note (Signed)
Patient encouraged to maintain heart healthy diet, regular exercise, adequate sleep. Consider daily probiotics. Take medications as prescribed. Declines colonoscopy has chosen to never have one. Labs ordered and reviewed.

## 2019-03-30 ENCOUNTER — Telehealth: Payer: Self-pay | Admitting: Family Medicine

## 2019-03-30 NOTE — Telephone Encounter (Signed)
Called pt to schedule f/u appt and she noted that she does want to proceed with CT Angiogram as recommended after US Carotid 11/02/18. Please place order.

## 2019-04-01 DIAGNOSIS — D582 Other hemoglobinopathies: Secondary | ICD-10-CM | POA: Insufficient documentation

## 2019-04-01 NOTE — Assessment & Plan Note (Signed)
Mildly elevated. Hydrate well and continue to monitor

## 2019-04-01 NOTE — Assessment & Plan Note (Signed)
Referred to dermatology for further consideration and evluation

## 2019-04-02 NOTE — Telephone Encounter (Signed)
Per Dr Frederik Pear last note pt needs appt to attach codes to CT. Pt was scheduled for a VV per her request. Pt also has questions about this test that she would like answered before getting.

## 2019-04-04 DIAGNOSIS — L570 Actinic keratosis: Secondary | ICD-10-CM | POA: Diagnosis not present

## 2019-04-04 DIAGNOSIS — L821 Other seborrheic keratosis: Secondary | ICD-10-CM | POA: Diagnosis not present

## 2019-04-04 DIAGNOSIS — L738 Other specified follicular disorders: Secondary | ICD-10-CM | POA: Diagnosis not present

## 2019-04-05 ENCOUNTER — Other Ambulatory Visit: Payer: Self-pay

## 2019-04-05 ENCOUNTER — Ambulatory Visit (INDEPENDENT_AMBULATORY_CARE_PROVIDER_SITE_OTHER): Payer: Medicare Other | Admitting: Family Medicine

## 2019-04-05 VITALS — Wt 196.0 lb

## 2019-04-05 DIAGNOSIS — H539 Unspecified visual disturbance: Secondary | ICD-10-CM

## 2019-04-05 DIAGNOSIS — R519 Headache, unspecified: Secondary | ICD-10-CM

## 2019-04-05 DIAGNOSIS — I10 Essential (primary) hypertension: Secondary | ICD-10-CM

## 2019-04-05 DIAGNOSIS — G479 Sleep disorder, unspecified: Secondary | ICD-10-CM

## 2019-04-05 DIAGNOSIS — I6522 Occlusion and stenosis of left carotid artery: Secondary | ICD-10-CM | POA: Diagnosis not present

## 2019-04-05 DIAGNOSIS — R0683 Snoring: Secondary | ICD-10-CM | POA: Diagnosis not present

## 2019-04-05 DIAGNOSIS — E782 Mixed hyperlipidemia: Secondary | ICD-10-CM

## 2019-04-08 DIAGNOSIS — R519 Headache, unspecified: Secondary | ICD-10-CM | POA: Insufficient documentation

## 2019-04-08 DIAGNOSIS — I6522 Occlusion and stenosis of left carotid artery: Secondary | ICD-10-CM | POA: Insufficient documentation

## 2019-04-08 DIAGNOSIS — H539 Unspecified visual disturbance: Secondary | ICD-10-CM | POA: Insufficient documentation

## 2019-04-08 NOTE — Assessment & Plan Note (Signed)
With restless sleep, fatigue, am HA and more. Referred to pulmonology for further consideration.

## 2019-04-08 NOTE — Assessment & Plan Note (Signed)
On ultrasound and due to irregular flow they recommend CTA of neck this is ordered today

## 2019-04-08 NOTE — Assessment & Plan Note (Signed)
Monitor and report any concerns, no changes to meds. Encouraged heart healthy diet such as the DASH diet and exercise as tolerated.  ?

## 2019-04-08 NOTE — Progress Notes (Signed)
Virtual Visit via Video Note  I connected with Jasmine Wilson on 04/05/19 at 10:00 AM EDT by a video enabled telemedicine application and verified that I am speaking with the correct person using two identifiers.  Location: Patient: home Provider: office   I discussed the limitations of evaluation and management by telemedicine and the availability of in person appointments. The patient expressed understanding and agreed to proceed. Magdalene Molly, CMA was able to get the patient set up on video visit.    Subjective:    Patient ID: Jasmine Wilson, female    DOB: 10-29-1940, 78 y.o.   MRN: [id number]  No chief complaint on file.   HPI Patient is in today for follow up on chronic medical concerns including hyperlipidemia, carotid artery disease, meniere's disease and more. She is noting some increase in fatigue, am HAs with visual changes at times. Notes some increased stress and anxiety recently with family and world stressors. She has been to dermatology recently and a lesion being frozen by Dr Dewain Penning. No recent febrile illness or hospitalizations. She endorses, restless sleep and snoring as well although she does note it is somewhat better sleeping on a wedge. Denies CP/palp/SOB/congestion/fevers/GI or GU c/o. Taking meds as prescribed  Past Medical History:  Diagnosis Date  . Abdominal pain 03/02/2011  . Arthritis 03/02/2012  . Bronchitis 04/04/2011  . Cataract   . Chicken pox as a child  . Cough 01/29/2013  . Female bladder prolapse, acquired 01/29/2011  . Glaucoma 01/29/2011  . Hearing aid worn   . History of pneumonia   . HTN (hypertension) 01/29/2011  . Hyperlipidemia   . Measles as a child  . Meniere's disease   . Pedal edema 04/01/2016  . Preventative health care 01/29/2011  . Shingles   . Vaginosis 01/29/2013    Past Surgical History:  Procedure Laterality Date  . BREAST BIOPSY Left   . lump removed on left breast     clogged milk duct, benign  . TUBAL LIGATION       Family History  Problem Relation Age of Onset  . Breast cancer Mother   . Stroke Father   . Heart disease Father   . Hyperlipidemia Father   . Hypertension Father   . Breast cancer Sister 40  . Uterine cancer Maternal Grandmother        uterus    Social History   Socioeconomic History  . Marital status: Widowed    Spouse name: Not on file  . Number of children: 3  . Years of education: Not on file  . Highest education level: Not on file  Occupational History  . Occupation: Retired  Scientific laboratory technician  . Financial resource strain: Not on file  . Food insecurity    Worry: Not on file    Inability: Not on file  . Transportation needs    Medical: Not on file    Non-medical: Not on file  Tobacco Use  . Smoking status: Former Smoker    Types: Cigarettes    Quit date: 06/21/1970    Years since quitting: 48.8  . Smokeless tobacco: Never Used  . Tobacco comment: occasional smoker  Substance and Sexual Activity  . Alcohol use: No  . Drug use: No  . Sexual activity: Never  Lifestyle  . Physical activity    Days per week: Not on file    Minutes per session: Not on file  . Stress: Not on file  Relationships  . Social connections  Talks on phone: Not on file    Gets together: Not on file    Attends religious service: Not on file    Active member of club or organization: Not on file    Attends meetings of clubs or organizations: Not on file    Relationship status: Not on file  . Intimate partner violence    Fear of current or ex partner: Not on file    Emotionally abused: Not on file    Physically abused: Not on file    Forced sexual activity: Not on file  Other Topics Concern  . Not on file  Social History Narrative  . Not on file    Outpatient Medications Prior to Visit  Medication Sig Dispense Refill  . atorvastatin (LIPITOR) 10 MG tablet TAKE 1 TABLET BY MOUTH  DAILY 90 tablet 3  . cetirizine (ZYRTEC) 10 MG tablet Take 1 tablet (10 mg total) by mouth daily as  needed for allergies or rhinitis (cough). 90 tablet 1  . dorzolamide (TRUSOPT) 2 % ophthalmic solution Place 1 drop into both eyes 2 (two) times daily.     Marland Kitchen latanoprost (XALATAN) 0.005 % ophthalmic solution Place 1 drop into both eyes at bedtime.     . metoprolol succinate (TOPROL-XL) 100 MG 24 hr tablet TAKE 1 TABLET BY MOUTH  DAILY WITH OR IMMEDIATELY  FOLLOWING A MEAL 90 tablet 3  . timolol (BETIMOL) 0.5 % ophthalmic solution Place 1 drop into both eyes 2 (two) times daily.     No facility-administered medications prior to visit.     Allergies  Allergen Reactions  . Codeine Other (See Comments)    Makes feel funny    Review of Systems  Constitutional: Positive for malaise/fatigue. Negative for fever.  HENT: Negative for congestion.   Eyes: Negative for blurred vision.  Respiratory: Negative for shortness of breath.   Cardiovascular: Negative for chest pain, palpitations and leg swelling.  Gastrointestinal: Negative for abdominal pain, blood in stool and nausea.  Genitourinary: Negative for dysuria and frequency.  Musculoskeletal: Negative for falls.  Skin: Negative for rash.  Neurological: Negative for dizziness, loss of consciousness and headaches.  Endo/Heme/Allergies: Negative for environmental allergies.  Psychiatric/Behavioral: Negative for depression. The patient is not nervous/anxious.        Objective:    Physical Exam Vitals signs and nursing note reviewed.  Constitutional:      General: She is not in acute distress.    Appearance: Normal appearance. She is well-developed. She is not ill-appearing.  HENT:     Head: Normocephalic and atraumatic.     Nose: Nose normal.  Eyes:     General:        Right eye: No discharge.        Left eye: No discharge.  Pulmonary:     Effort: Pulmonary effort is normal.  Skin:    General: Skin is dry.  Neurological:     Mental Status: She is alert and oriented to person, place, and time.  Psychiatric:        Mood and  Affect: Mood normal.        Behavior: Behavior normal.     Wt 196 lb (88.9 kg)   BMI 29.80 kg/m  Wt Readings from Last 3 Encounters:  04/05/19 196 lb (88.9 kg)  03/27/19 198 lb (89.8 kg)  04/03/18 182 lb 3.2 oz (82.6 kg)    Diabetic Foot Exam - Simple   No data filed     Lab Results  Component Value Date   WBC 6.6 03/27/2019   HGB 15.2 (H) 03/27/2019   HCT 45.7 03/27/2019   PLT 264.0 03/27/2019   GLUCOSE 93 03/27/2019   CHOL 169 03/27/2019   TRIG 159.0 (H) 03/27/2019   HDL 56.00 03/27/2019   LDLDIRECT 82.0 04/01/2016   LDLCALC 81 03/27/2019   ALT 14 03/27/2019   AST 17 03/27/2019   NA 138 03/27/2019   K 4.8 03/27/2019   CL 103 03/27/2019   CREATININE 0.83 03/27/2019   BUN 16 03/27/2019   CO2 27 03/27/2019   TSH 1.70 03/27/2019    Lab Results  Component Value Date   TSH 1.70 03/27/2019   Lab Results  Component Value Date   WBC 6.6 03/27/2019   HGB 15.2 (H) 03/27/2019   HCT 45.7 03/27/2019   MCV 90.1 03/27/2019   PLT 264.0 03/27/2019   Lab Results  Component Value Date   NA 138 03/27/2019   K 4.8 03/27/2019   CO2 27 03/27/2019   GLUCOSE 93 03/27/2019   BUN 16 03/27/2019   CREATININE 0.83 03/27/2019   BILITOT 0.6 03/27/2019   ALKPHOS 92 03/27/2019   AST 17 03/27/2019   ALT 14 03/27/2019   PROT 6.4 03/27/2019   ALBUMIN 4.3 03/27/2019   CALCIUM 9.6 03/27/2019   GFR 66.47 03/27/2019   Lab Results  Component Value Date   CHOL 169 03/27/2019   Lab Results  Component Value Date   HDL 56.00 03/27/2019   Lab Results  Component Value Date   LDLCALC 81 03/27/2019   Lab Results  Component Value Date   TRIG 159.0 (H) 03/27/2019   Lab Results  Component Value Date   CHOLHDL 3 03/27/2019   No results found for: HGBA1C     Assessment & Plan:   Problem List Items Addressed This Visit    Hyperlipidemia    Encouraged heart healthy diet, increase exercise, avoid trans fats, consider a krill oil cap daily. Tolerating Atorvastatin      HTN  (hypertension)    Monitor and report any concerns, no changes to meds. Encouraged heart healthy diet such as the DASH diet and exercise as tolerated.       Snoring    With restless sleep, fatigue, am HA and more. Referred to pulmonology for further consideration.      Relevant Orders   Ambulatory referral to Pulmonology   Stenosis of left carotid artery    On ultrasound and due to irregular flow they recommend CTA of neck this is ordered today      Relevant Orders   CT ANGIO NECK W OR WO CONTRAST   Nonintractable headache    Encouraged increased hydration, 64 ounces of clear fluids daily. Minimize alcohol and caffeine. Eat small frequent meals with lean proteins and complex carbs. Avoid high and low blood sugars. Get adequate sleep, 7-8 hours a night. Needs exercise daily preferably in the morning.      Relevant Orders   CT ANGIO NECK W OR WO CONTRAST   Ambulatory referral to Pulmonology   Visual changes - Primary   Relevant Orders   CT ANGIO NECK W OR WO CONTRAST    Other Visit Diagnoses    Restless sleeper       Relevant Orders   Ambulatory referral to Pulmonology      I am having Jasmine Wilson maintain her latanoprost, dorzolamide, timolol, cetirizine, atorvastatin, and metoprolol succinate.  No orders of the defined types were placed in this encounter.  I discussed the assessment and treatment plan with the patient. The patient was provided an opportunity to ask questions and all were answered. The patient agreed with the plan and demonstrated an understanding of the instructions.   The patient was advised to call back or seek an in-person evaluation if the symptoms worsen or if the condition fails to improve as anticipated.  I provided 25 minutes of non-face-to-face time during this encounter.   Penni Homans, MD

## 2019-04-08 NOTE — Assessment & Plan Note (Signed)
Encouraged increased hydration, 64 ounces of clear fluids daily. Minimize alcohol and caffeine. Eat small frequent meals with lean proteins and complex carbs. Avoid high and low blood sugars. Get adequate sleep, 7-8 hours a night. Needs exercise daily preferably in the morning.  

## 2019-04-08 NOTE — Assessment & Plan Note (Signed)
Encouraged heart healthy diet, increase exercise, avoid trans fats, consider a krill oil cap daily. Tolerating Atorvastatin 

## 2019-04-11 ENCOUNTER — Other Ambulatory Visit: Payer: Self-pay

## 2019-04-11 ENCOUNTER — Ambulatory Visit (HOSPITAL_BASED_OUTPATIENT_CLINIC_OR_DEPARTMENT_OTHER)
Admission: RE | Admit: 2019-04-11 | Discharge: 2019-04-11 | Disposition: A | Discharge status: Home / Self Care | Payer: Medicare Other | Source: Ambulatory Visit | Attending: Family Medicine | Admitting: Family Medicine

## 2019-04-11 ENCOUNTER — Encounter (HOSPITAL_BASED_OUTPATIENT_CLINIC_OR_DEPARTMENT_OTHER): Payer: Self-pay

## 2019-04-11 DIAGNOSIS — I6523 Occlusion and stenosis of bilateral carotid arteries: Secondary | ICD-10-CM | POA: Diagnosis not present

## 2019-04-11 DIAGNOSIS — I6522 Occlusion and stenosis of left carotid artery: Secondary | ICD-10-CM | POA: Diagnosis not present

## 2019-04-11 DIAGNOSIS — R519 Headache, unspecified: Secondary | ICD-10-CM | POA: Diagnosis not present

## 2019-04-11 DIAGNOSIS — H539 Unspecified visual disturbance: Secondary | ICD-10-CM | POA: Insufficient documentation

## 2019-04-11 IMAGING — CT CT ANGIO NECK
2 of 7 series · 8 of 33 positions shown · IV contrast (Omnipaque)
Comparison: Ultrasound [DATE]

CLINICAL DATA: Follow-up carotid stenosis. Headache and visual
disturbance.

EXAM:
CT ANGIOGRAPHY NECK
TECHNIQUE: Multidetector CT imaging of the neck was performed using the
standard protocol during bolus administration of intravenous
contrast. Multiplanar CT image reconstructions and MIPs were
obtained to evaluate the vascular anatomy. Carotid stenosis
measurements (when applicable) are obtained utilizing NASCET
criteria, using the distal internal carotid diameter as the
denominator.
CONTRAST:  100mL OMNIPAQUE IOHEXOL 350 MG/ML SOLN

[Series 5: cta neck · axial · 0.54mm/px · z∈[-210,-124]mm · 2 of 131 slices shown]
[im 44/131  soft-tissue]
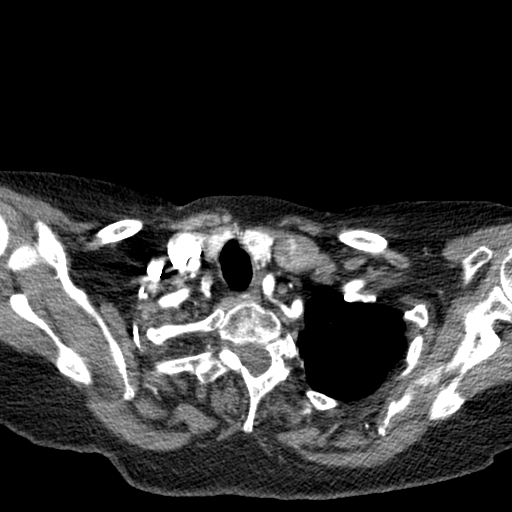
[im 87/131  soft-tissue]
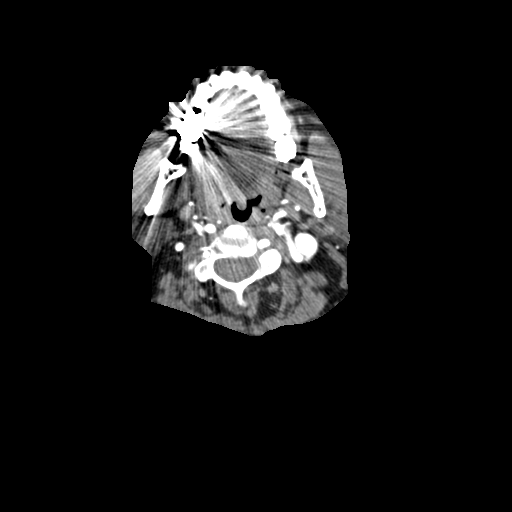

[Series 7: ax thin · axial · 0.49mm/px · z∈[-258,-73]mm · 6 of 261 slices shown]
[im 38/261  soft-tissue]
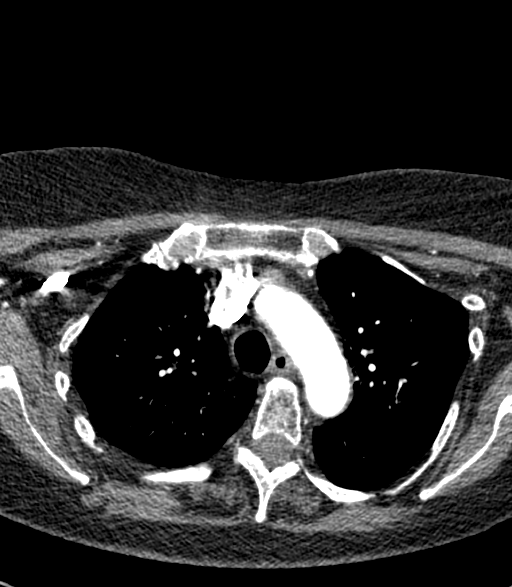
[im 75/261  bone]
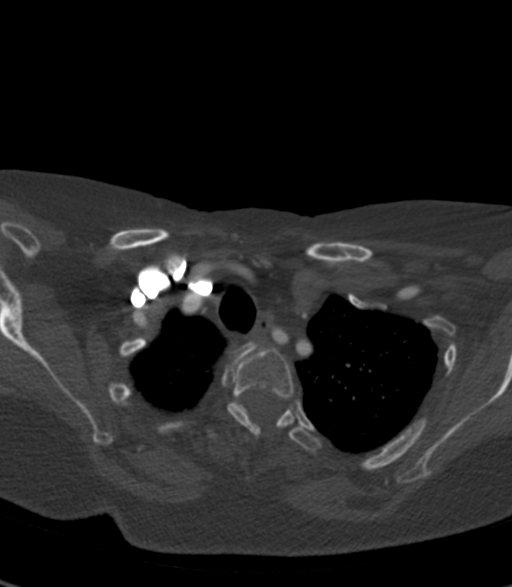
[im 112/261  soft-tissue]
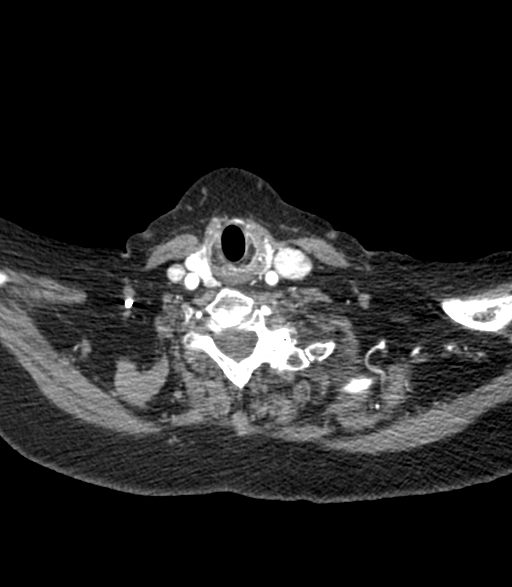
[im 149/261  bone]
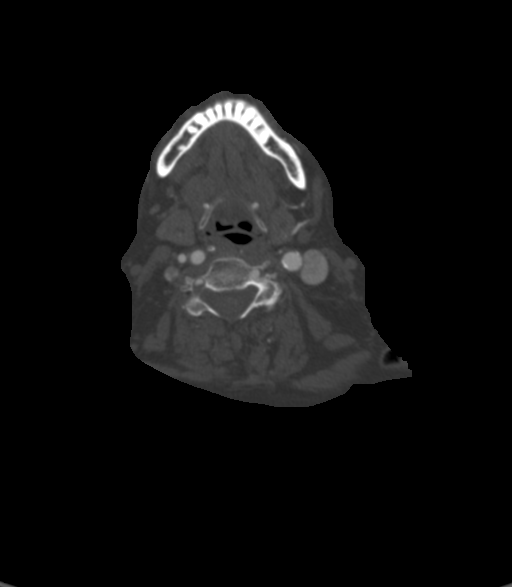
[im 186/261  soft-tissue]
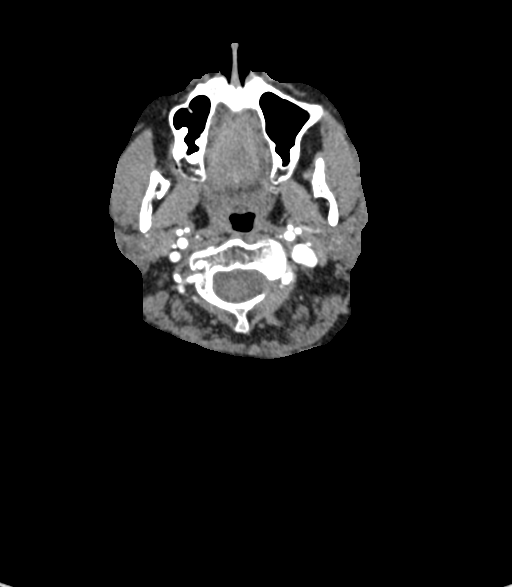
[im 223/261  bone]
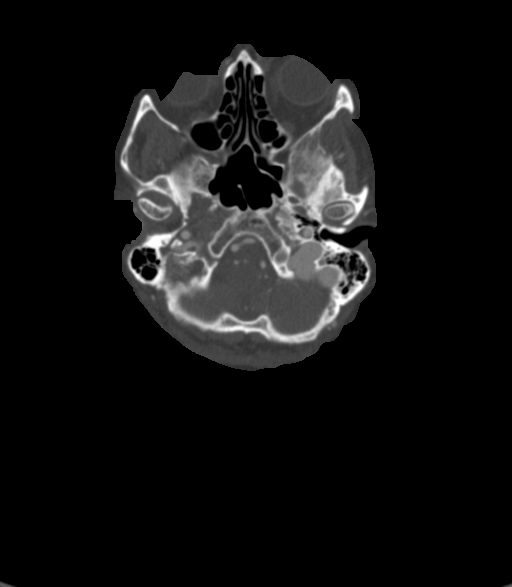

[8 of 33 positions shown; findings below may reference images not displayed]

FINDINGS: Aortic arch: Aortic atherosclerosis. No aneurysm or dissection.
Branching pattern is normal without brachiocephalic vessel origin
stenosis.

Right carotid system: Common carotid artery widely patent to the
bifurcation. Calcified plaque at the carotid bifurcation and
proximal ICA bulb, but no stenosis. Right cervical ICA shows
pronounced fibromuscular change at the C1 and C2 level, but 1 no
evidence of 1 flow limiting stenosis or pseudo aneurysm.

Left carotid system: Common carotid artery widely patent to the
bifurcation. Calcified plaque at the carotid bifurcation and
proximal ICA bulb but no stenosis. 3 cm beyond the bifurcation, the
vessel is tortuous in there is a kink but this does not appear to be
due to atherosclerotic plaque. At the C1 and C2 level, there is
fibromuscular change of the internal carotid artery as seen on the
right but no stenosis or pseudo aneurysm. I cannot specifically
explain the noninvasive findings.

Vertebral arteries: Both vertebral arteries are widely patent at
their origins and through the cervical region to the foramen magnum
and basilar.

Skeleton: Minimal cervical spondylosis.

Other neck: No mass or lymphadenopathy. Small likely benign thyroid
nodules.

Upper chest: Normal
IMPRESSION: Mild atherosclerotic disease at both carotid bifurcations with
calcified plaque but no measurable stenosis of either proximal ICA.
I do not see a finding to explain the suggestion of moderate
stenosis of the left ICA from noninvasive study. There is a kink in
the left ICA 3 cm distal to the bifurcation, but no atherosclerotic
disease. Fibromuscular change affects both distal cervical ICAs but
I do not see a flow limiting stenosis or pseudo aneurysm.

## 2019-04-11 MED ORDER — IOHEXOL 350 MG/ML SOLN
100.0000 mL | Freq: Once | INTRAVENOUS | Status: AC | PRN
  Administered 2019-04-11: 100 mL via INTRAVENOUS

## 2019-07-31 ENCOUNTER — Other Ambulatory Visit: Payer: Self-pay

## 2019-07-31 ENCOUNTER — Ambulatory Visit (INDEPENDENT_AMBULATORY_CARE_PROVIDER_SITE_OTHER): Payer: Medicare Other | Admitting: Family Medicine

## 2019-07-31 ENCOUNTER — Encounter: Payer: Self-pay | Admitting: Family Medicine

## 2019-07-31 DIAGNOSIS — E782 Mixed hyperlipidemia: Secondary | ICD-10-CM | POA: Diagnosis not present

## 2019-07-31 DIAGNOSIS — I10 Essential (primary) hypertension: Secondary | ICD-10-CM

## 2019-07-31 DIAGNOSIS — I6522 Occlusion and stenosis of left carotid artery: Secondary | ICD-10-CM

## 2019-07-31 NOTE — Assessment & Plan Note (Signed)
Encouraged heart healthy diet, increase exercise, avoid trans fats, consider a krill oil cap daily, tolerating Atorvastatin 

## 2019-07-31 NOTE — Assessment & Plan Note (Signed)
Discussed her CT angiogram with patient and confirmed the findings were reassuring after her ultrasound was concerning. She has been asymptomatic and she is feeling well. Will continue to manage risk factors

## 2019-07-31 NOTE — Assessment & Plan Note (Signed)
Monitor and report concerning numbers, no changes to meds. Encouraged heart healthy diet such as the DASH diet and exercise as tolerated.  

## 2019-07-31 NOTE — Patient Instructions (Signed)
[employer] Blood Pressure cuff, upper arm, want BP 100-140/60-90 Pulse oximeter, want oxygen in 90s  Weekly vitals  Take Multivitamin with minerals, selenium Vitamin D 1000-2000 IU daily Probiotic with lactobacillus and bifidophilus Asprin EC 81 mg daily  Melatonin 2-5 mg at bedtime  [email or web address] 

## 2019-07-31 NOTE — Progress Notes (Signed)
Virtual Visit via Video Note  I connected with Jasmine Wilson on 07/31/19 at  3:00 PM EST by a video enabled telemedicine application and verified that I am speaking with the correct person using two identifiers.  Location: Patient: home Provider: office   I discussed the limitations of evaluation and management by telemedicine and the availability of in person appointments. The patient expressed understanding and agreed to proceed. Robin Ewing, CMA was able to get the patient set up on a video visit    Subjective:    Patient ID: Jasmine Wilson, female    DOB: [birth year], 79 y.o.   MRN: [id number]  Chief Complaint  Patient presents with  . Follow-up    HPI Patient is in today for follow up on chronic medical concerns. She feels well today. No recent febrile illness or hospitalizations. She has had no syncopal or significant dizzy episodes. She is maintaining quarantine well and tries to stay active and maintain a heart healthy diet. Denies CP/palp/SOB/HA/congestion/fevers/GI or GU c/o. Taking meds as prescribed  Past Medical History:  Diagnosis Date  . Abdominal pain 03/02/2011  . Arthritis 03/02/2012  . Bronchitis 04/04/2011  . Cataract   . Chicken pox as a child  . Cough 01/29/2013  . Female bladder prolapse, acquired 01/29/2011  . Glaucoma 01/29/2011  . Hearing aid worn   . History of pneumonia   . HTN (hypertension) 01/29/2011  . Hyperlipidemia   . Measles as a child  . Meniere's disease   . Pedal edema 04/01/2016  . Preventative health care 01/29/2011  . Shingles   . Vaginosis 01/29/2013    Past Surgical History:  Procedure Laterality Date  . BREAST BIOPSY Left   . lump removed on left breast     clogged milk duct, benign  . TUBAL LIGATION      Family History  Problem Relation Age of Onset  . Breast cancer Mother   . Stroke Father   . Heart disease Father   . Hyperlipidemia Father   . Hypertension Father   . Breast cancer Sister 74  . Uterine cancer Maternal  Grandmother        uterus    Social History   Socioeconomic History  . Marital status: Widowed    Spouse name: Not on file  . Number of children: 3  . Years of education: Not on file  . Highest education level: Not on file  Occupational History  . Occupation: Retired  Tobacco Use  . Smoking status: Former Smoker    Types: Cigarettes    Quit date: 06/21/1970    Years since quitting: 49.1  . Smokeless tobacco: Never Used  . Tobacco comment: occasional smoker  Substance and Sexual Activity  . Alcohol use: No  . Drug use: No  . Sexual activity: Never  Other Topics Concern  . Not on file  Social History Narrative  . Not on file   Social Determinants of Health   Financial Resource Strain:   . Difficulty of Paying Living Expenses: Not on file  Food Insecurity:   . Worried About Charity fundraiser in the Last Year: Not on file  . Ran Out of Food in the Last Year: Not on file  Transportation Needs:   . Lack of Transportation (Medical): Not on file  . Lack of Transportation (Non-Medical): Not on file  Physical Activity:   . Days of Exercise per Week: Not on file  . Minutes of Exercise per Session: Not on file  Stress:   . Feeling of Stress : Not on file  Social Connections:   . Frequency of Communication with Friends and Family: Not on file  . Frequency of Social Gatherings with Friends and Family: Not on file  . Attends Religious Services: Not on file  . Active Member of Clubs or Organizations: Not on file  . Attends Archivist Meetings: Not on file  . Marital Status: Not on file  Intimate Partner Violence:   . Fear of Current or Ex-Partner: Not on file  . Emotionally Abused: Not on file  . Physically Abused: Not on file  . Sexually Abused: Not on file    Outpatient Medications Prior to Visit  Medication Sig Dispense Refill  . atorvastatin (LIPITOR) 10 MG tablet TAKE 1 TABLET BY MOUTH  DAILY 90 tablet 3  . cetirizine (ZYRTEC) 10 MG tablet Take 1 tablet  (10 mg total) by mouth daily as needed for allergies or rhinitis (cough). 90 tablet 1  . dorzolamide (TRUSOPT) 2 % ophthalmic solution Place 1 drop into both eyes 2 (two) times daily.     Marland Kitchen latanoprost (XALATAN) 0.005 % ophthalmic solution Place 1 drop into both eyes at bedtime.     . metoprolol succinate (TOPROL-XL) 100 MG 24 hr tablet TAKE 1 TABLET BY MOUTH  DAILY WITH OR IMMEDIATELY  FOLLOWING A MEAL 90 tablet 3  . timolol (BETIMOL) 0.5 % ophthalmic solution Place 1 drop into both eyes 2 (two) times daily.     No facility-administered medications prior to visit.    Allergies  Allergen Reactions  . Codeine Other (See Comments)    Makes feel funny    Review of Systems  Constitutional: Negative for fever and malaise/fatigue.  HENT: Negative for congestion.   Eyes: Negative for blurred vision.  Respiratory: Negative for shortness of breath.   Cardiovascular: Negative for chest pain, palpitations and leg swelling.  Gastrointestinal: Negative for abdominal pain, blood in stool and nausea.  Genitourinary: Negative for dysuria and frequency.  Musculoskeletal: Negative for falls.  Skin: Negative for rash.  Neurological: Negative for dizziness, loss of consciousness and headaches.  Endo/Heme/Allergies: Negative for environmental allergies.  Psychiatric/Behavioral: Negative for depression. The patient is not nervous/anxious.        Objective:    Physical Exam Constitutional:      Appearance: Normal appearance. She is not ill-appearing.  HENT:     Head: Normocephalic and atraumatic.     Nose: Nose normal.  Eyes:     General:        Right eye: No discharge.        Left eye: No discharge.  Pulmonary:     Effort: Pulmonary effort is normal.  Neurological:     Mental Status: She is alert and oriented to person, place, and time.  Psychiatric:        Behavior: Behavior normal.     BP (!) 158/77 (BP Location: Left Arm, Patient Position: Sitting, Cuff Size: Normal)   Pulse 94    Ht 5\' 8"  (1.727 m)   Wt 200 lb (90.7 kg)   SpO2 96%   BMI 30.41 kg/m  Wt Readings from Last 3 Encounters:  07/31/19 200 lb (90.7 kg)  04/05/19 196 lb (88.9 kg)  03/27/19 198 lb (89.8 kg)    Diabetic Foot Exam - Simple   No data filed     Lab Results  Component Value Date   WBC 6.6 03/27/2019   HGB 15.2 (H) 03/27/2019   HCT  45.7 03/27/2019   PLT 264.0 03/27/2019   GLUCOSE 93 03/27/2019   CHOL 169 03/27/2019   TRIG 159.0 (H) 03/27/2019   HDL 56.00 03/27/2019   LDLDIRECT 82.0 04/01/2016   LDLCALC 81 03/27/2019   ALT 14 03/27/2019   AST 17 03/27/2019   NA 138 03/27/2019   K 4.8 03/27/2019   CL 103 03/27/2019   CREATININE 0.83 03/27/2019   BUN 16 03/27/2019   CO2 27 03/27/2019   TSH 1.70 03/27/2019    Lab Results  Component Value Date   TSH 1.70 03/27/2019   Lab Results  Component Value Date   WBC 6.6 03/27/2019   HGB 15.2 (H) 03/27/2019   HCT 45.7 03/27/2019   MCV 90.1 03/27/2019   PLT 264.0 03/27/2019   Lab Results  Component Value Date   NA 138 03/27/2019   K 4.8 03/27/2019   CO2 27 03/27/2019   GLUCOSE 93 03/27/2019   BUN 16 03/27/2019   CREATININE 0.83 03/27/2019   BILITOT 0.6 03/27/2019   ALKPHOS 92 03/27/2019   AST 17 03/27/2019   ALT 14 03/27/2019   PROT 6.4 03/27/2019   ALBUMIN 4.3 03/27/2019   CALCIUM 9.6 03/27/2019   GFR 66.47 03/27/2019   Lab Results  Component Value Date   CHOL 169 03/27/2019   Lab Results  Component Value Date   HDL 56.00 03/27/2019   Lab Results  Component Value Date   LDLCALC 81 03/27/2019   Lab Results  Component Value Date   TRIG 159.0 (H) 03/27/2019   Lab Results  Component Value Date   CHOLHDL 3 03/27/2019   No results found for: HGBA1C     Assessment & Plan:   Problem List Items Addressed This Visit    Hyperlipidemia    Encouraged heart healthy diet, increase exercise, avoid trans fats, consider a krill oil cap daily, tolerating Atorvastatin      HTN (hypertension)     Monitor and report  concerning numbers, no changes to meds. Encouraged heart healthy diet such as the DASH diet and exercise as tolerated.       Stenosis of left carotid artery    Discussed her CT angiogram with patient and confirmed the findings were reassuring after her ultrasound was concerning. She has been asymptomatic and she is feeling well. Will continue to manage risk factors         I am having Almyra Deforest maintain her latanoprost, dorzolamide, timolol, cetirizine, atorvastatin, and metoprolol succinate.  No orders of the defined types were placed in this encounter.   I discussed the assessment and treatment plan with the patient. The patient was provided an opportunity to ask questions and all were answered. The patient agreed with the plan and demonstrated an understanding of the instructions.   The patient was advised to call back or seek an in-person evaluation if the symptoms worsen or if the condition fails to improve as anticipated.  I provided 15 minutes of non-face-to-face time during this encounter.   Penni Homans, MD

## 2019-12-04 ENCOUNTER — Other Ambulatory Visit: Payer: Self-pay

## 2019-12-04 ENCOUNTER — Telehealth (INDEPENDENT_AMBULATORY_CARE_PROVIDER_SITE_OTHER): Payer: Medicare Other | Admitting: Family Medicine

## 2019-12-04 VITALS — BP 178/100 | HR 83 | Temp 98.3°F | Wt 197.7 lb

## 2019-12-04 DIAGNOSIS — I10 Essential (primary) hypertension: Secondary | ICD-10-CM | POA: Diagnosis not present

## 2019-12-04 DIAGNOSIS — R739 Hyperglycemia, unspecified: Secondary | ICD-10-CM | POA: Diagnosis not present

## 2019-12-04 DIAGNOSIS — Z9109 Other allergy status, other than to drugs and biological substances: Secondary | ICD-10-CM | POA: Diagnosis not present

## 2019-12-04 DIAGNOSIS — E782 Mixed hyperlipidemia: Secondary | ICD-10-CM

## 2019-12-04 NOTE — Assessment & Plan Note (Signed)
Encouraged increased hydration, 64 ounces of clear fluids daily. Minimize alcohol and caffeine. Eat small frequent meals with lean proteins and complex carbs. Avoid high and low blood sugars. Get adequate sleep, 7-8 hours a night. Needs exercise daily preferably in the morning.  

## 2019-12-04 NOTE — Assessment & Plan Note (Signed)
No recent flare noted she is largely and doing well, no changes

## 2019-12-04 NOTE — Progress Notes (Signed)
Subjective:    Patient ID: Jasmine Wilson, female    DOB: 12-22-1940, 79 y.o.   MRN: [id number]  Chief Complaint  Patient presents with  . Follow-up    HPI Patient is in today for follow up on chronic medical concerns. No recent febrile illness or hospitalizations. No polyuria or polydipsia. She has had both of her vaccines and is seeing family more but she is still not going out frequently. Denies CP/palp/SOB/HA/congestion/fevers/GI or GU c/o. Taking meds as prescribed  Past Medical History:  Diagnosis Date  . Abdominal pain 03/02/2011  . Arthritis 03/02/2012  . Bronchitis 04/04/2011  . Cataract   . Chicken pox as a child  . Cough 01/29/2013  . Female bladder prolapse, acquired 01/29/2011  . Glaucoma 01/29/2011  . Hearing aid worn   . History of pneumonia   . HTN (hypertension) 01/29/2011  . Hyperlipidemia   . Measles as a child  . Meniere's disease   . Pedal edema 04/01/2016  . Preventative health care 01/29/2011  . Shingles   . Vaginosis 01/29/2013    Past Surgical History:  Procedure Laterality Date  . BREAST BIOPSY Left   . lump removed on left breast     clogged milk duct, benign  . TUBAL LIGATION      Family History  Problem Relation Age of Onset  . Breast cancer Mother   . Stroke Father   . Heart disease Father   . Hyperlipidemia Father   . Hypertension Father   . Breast cancer Sister 67  . Uterine cancer Maternal Grandmother        uterus    Social History   Socioeconomic History  . Marital status: Widowed    Spouse name: Not on file  . Number of children: 3  . Years of education: Not on file  . Highest education level: Not on file  Occupational History  . Occupation: Retired  Tobacco Use  . Smoking status: Former Smoker    Types: Cigarettes    Quit date: 06/21/1970    Years since quitting: 49.4  . Smokeless tobacco: Never Used  . Tobacco comment: occasional smoker  Vaping Use  . Vaping Use: Never used  Substance and Sexual Activity  . Alcohol  use: No  . Drug use: No  . Sexual activity: Never  Other Topics Concern  . Not on file  Social History Narrative  . Not on file   Social Determinants of Health   Financial Resource Strain:   . Difficulty of Paying Living Expenses:   Food Insecurity:   . Worried About Charity fundraiser in the Last Year:   . Arboriculturist in the Last Year:   Transportation Needs:   . Film/video editor (Medical):   Marland Kitchen Lack of Transportation (Non-Medical):   Physical Activity:   . Days of Exercise per Week:   . Minutes of Exercise per Session:   Stress:   . Feeling of Stress :   Social Connections:   . Frequency of Communication with Friends and Family:   . Frequency of Social Gatherings with Friends and Family:   . Attends Religious Services:   . Active Member of Clubs or Organizations:   . Attends Archivist Meetings:   Marland Kitchen Marital Status:   Intimate Partner Violence:   . Fear of Current or Ex-Partner:   . Emotionally Abused:   Marland Kitchen Physically Abused:   . Sexually Abused:     Outpatient Medications Prior to  Visit  Medication Sig Dispense Refill  . atorvastatin (LIPITOR) 10 MG tablet TAKE 1 TABLET BY MOUTH  DAILY 90 tablet 3  . cetirizine (ZYRTEC) 10 MG tablet Take 1 tablet (10 mg total) by mouth daily as needed for allergies or rhinitis (cough). 90 tablet 1  . dorzolamide (TRUSOPT) 2 % ophthalmic solution Place 1 drop into both eyes 2 (two) times daily.     Marland Kitchen latanoprost (XALATAN) 0.005 % ophthalmic solution Place 1 drop into both eyes at bedtime.     . metoprolol succinate (TOPROL-XL) 100 MG 24 hr tablet TAKE 1 TABLET BY MOUTH  DAILY WITH OR IMMEDIATELY  FOLLOWING A MEAL 90 tablet 3  . timolol (BETIMOL) 0.5 % ophthalmic solution Place 1 drop into both eyes 2 (two) times daily.     No facility-administered medications prior to visit.    Allergies  Allergen Reactions  . Codeine Other (See Comments)    Makes feel funny    Review of Systems  Constitutional: Negative for  fever and malaise/fatigue.  HENT: Negative for congestion.   Eyes: Negative for blurred vision.  Respiratory: Negative for shortness of breath.   Cardiovascular: Negative for chest pain, palpitations and leg swelling.  Gastrointestinal: Negative for abdominal pain, blood in stool and nausea.  Genitourinary: Negative for dysuria and frequency.  Musculoskeletal: Negative for falls.  Skin: Negative for rash.  Neurological: Negative for dizziness, loss of consciousness and headaches.  Endo/Heme/Allergies: Negative for environmental allergies.  Psychiatric/Behavioral: Negative for depression. The patient is not nervous/anxious.        Objective:    Physical Exam Constitutional:      Appearance: Normal appearance. She is not ill-appearing.  HENT:     Head: Normocephalic and atraumatic.     Right Ear: External ear normal.     Left Ear: External ear normal.     Nose: Nose normal.  Eyes:     General:        Right eye: No discharge.        Left eye: No discharge.  Pulmonary:     Effort: Pulmonary effort is normal.  Neurological:     Mental Status: She is alert and oriented to person, place, and time.  Psychiatric:        Behavior: Behavior normal.     BP (!) 178/100   Pulse 83   Temp 98.3 F (36.8 C)   Wt 197 lb 11.2 oz (89.7 kg)   SpO2 95%   BMI 30.06 kg/m  Wt Readings from Last 3 Encounters:  12/04/19 197 lb 11.2 oz (89.7 kg)  07/31/19 200 lb (90.7 kg)  04/05/19 196 lb (88.9 kg)    Diabetic Foot Exam - Simple   No data filed     Lab Results  Component Value Date   WBC 6.6 03/27/2019   HGB 15.2 (H) 03/27/2019   HCT 45.7 03/27/2019   PLT 264.0 03/27/2019   GLUCOSE 93 03/27/2019   CHOL 169 03/27/2019   TRIG 159.0 (H) 03/27/2019   HDL 56.00 03/27/2019   LDLDIRECT 82.0 04/01/2016   LDLCALC 81 03/27/2019   ALT 14 03/27/2019   AST 17 03/27/2019   NA 138 03/27/2019   K 4.8 03/27/2019   CL 103 03/27/2019   CREATININE 0.83 03/27/2019   BUN 16 03/27/2019   CO2  27 03/27/2019   TSH 1.70 03/27/2019    Lab Results  Component Value Date   TSH 1.70 03/27/2019   Lab Results  Component Value Date  WBC 6.6 03/27/2019   HGB 15.2 (H) 03/27/2019   HCT 45.7 03/27/2019   MCV 90.1 03/27/2019   PLT 264.0 03/27/2019   Lab Results  Component Value Date   NA 138 03/27/2019   K 4.8 03/27/2019   CO2 27 03/27/2019   GLUCOSE 93 03/27/2019   BUN 16 03/27/2019   CREATININE 0.83 03/27/2019   BILITOT 0.6 03/27/2019   ALKPHOS 92 03/27/2019   AST 17 03/27/2019   ALT 14 03/27/2019   PROT 6.4 03/27/2019   ALBUMIN 4.3 03/27/2019   CALCIUM 9.6 03/27/2019   GFR 66.47 03/27/2019   Lab Results  Component Value Date   CHOL 169 03/27/2019   Lab Results  Component Value Date   HDL 56.00 03/27/2019   Lab Results  Component Value Date   LDLCALC 81 03/27/2019   Lab Results  Component Value Date   TRIG 159.0 (H) 03/27/2019   Lab Results  Component Value Date   CHOLHDL 3 03/27/2019   No results found for: HGBA1C     Assessment & Plan:   Problem List Items Addressed This Visit    Hyperlipidemia - Primary    Encouraged heart healthy diet, increase exercise, avoid trans fats, consider a krill oil cap daily      Relevant Orders   Lipid panel   HTN (hypertension)    Encouraged increased hydration, 64 ounces of clear fluids daily. Minimize alcohol and caffeine. Eat small frequent meals with lean proteins and complex carbs. Avoid high and low blood sugars. Get adequate sleep, 7-8 hours a night. Needs exercise daily preferably in the morning..      Relevant Orders   TSH   Comprehensive metabolic panel   CBC   Environmental allergies    No recent flare noted she is largely and doing well, no changes       Other Visit Diagnoses    Hyperglycemia          I am having Jasmine Wilson maintain her latanoprost, dorzolamide, timolol, cetirizine, atorvastatin, and metoprolol succinate.  No orders of the defined types were placed in this  encounter.    Penni Homans, MD

## 2019-12-04 NOTE — Assessment & Plan Note (Signed)
Encouraged heart healthy diet, increase exercise, avoid trans fats, consider a krill oil cap daily 

## 2019-12-26 ENCOUNTER — Other Ambulatory Visit: Payer: Self-pay

## 2019-12-26 ENCOUNTER — Ambulatory Visit (INDEPENDENT_AMBULATORY_CARE_PROVIDER_SITE_OTHER): Payer: Medicare Other

## 2019-12-26 ENCOUNTER — Other Ambulatory Visit (INDEPENDENT_AMBULATORY_CARE_PROVIDER_SITE_OTHER): Payer: Medicare Other

## 2019-12-26 DIAGNOSIS — I10 Essential (primary) hypertension: Secondary | ICD-10-CM

## 2019-12-26 DIAGNOSIS — E782 Mixed hyperlipidemia: Secondary | ICD-10-CM | POA: Diagnosis not present

## 2019-12-26 DIAGNOSIS — R739 Hyperglycemia, unspecified: Secondary | ICD-10-CM

## 2019-12-26 LAB — COMPREHENSIVE METABOLIC PANEL
ALT: 16 U/L (ref 0–35)
AST: 16 U/L (ref 0–37)
Albumin: 4.4 g/dL (ref 3.5–5.2)
Alkaline Phosphatase: 90 U/L (ref 39–117)
BUN: 16 mg/dL (ref 6–23)
CO2: 26 mEq/L (ref 19–32)
Calcium: 9.4 mg/dL (ref 8.4–10.5)
Chloride: 104 mEq/L (ref 96–112)
Creatinine, Ser: 0.84 mg/dL (ref 0.40–1.20)
GFR: 65.43 mL/min (ref >60.00)
Glucose, Bld: 103 mg/dL — ABNORMAL HIGH (ref 70–99)
Potassium: 4.4 mEq/L (ref 3.5–5.1)
Sodium: 139 mEq/L (ref 135–145)
Total Bilirubin: 0.6 mg/dL (ref 0.2–1.2)
Total Protein: 6.3 g/dL (ref 6.0–8.3)

## 2019-12-26 LAB — CBC
HCT: 44.4 % (ref 36.0–46.0)
Hemoglobin: 15 g/dL (ref 12.0–15.0)
MCHC: 33.8 g/dL (ref 30.0–36.0)
MCV: 90.5 fl (ref 78.0–100.0)
Platelets: 238 10*3/uL (ref 150.0–400.0)
RBC: 4.91 Mil/uL (ref 3.87–5.11)
RDW: 13.9 % (ref 11.5–15.5)
WBC: 6.7 10*3/uL (ref 4.0–10.5)

## 2019-12-26 LAB — LIPID PANEL
Cholesterol: 159 mg/dL (ref 0–200)
HDL: 56.6 mg/dL (ref >39.00)
LDL Cholesterol: 68 mg/dL (ref 0–99)
NonHDL: 101.98
Total CHOL/HDL Ratio: 3
Triglycerides: 171 mg/dL — ABNORMAL HIGH (ref 0.0–149.0)
VLDL: 34.2 mg/dL (ref 0.0–40.0)

## 2019-12-26 LAB — TSH: TSH: 1.83 u[IU]/mL (ref 0.35–4.50)

## 2019-12-26 LAB — HEMOGLOBIN A1C: Hgb A1c MFr Bld: 5.6 % (ref 4.6–6.5)

## 2019-12-26 NOTE — Progress Notes (Signed)
Pt here for Blood pressure check per   Pt currently takes: Metoprolol succinate 100 MG. 1 tablet daily.   Pt reports compliance with medication.  BP today @ = 138/92 HR = 82  Pt advised per Dr. Lorelei Pont to check blood pressures at home more frequently and record her numbers. Copland advised patient to follow up with Charlett Blake 2 to 3 months. Pt advised to call if her blood pressures start to run high again. Appt made with [facility].  BP Readings from Last 3 Encounters:  12/04/19 (!) 178/100  07/31/19 (!) 158/77  03/27/19 118/82

## 2020-01-31 ENCOUNTER — Other Ambulatory Visit: Payer: Self-pay | Admitting: Family Medicine

## 2020-03-06 ENCOUNTER — Telehealth: Payer: Self-pay | Admitting: Family Medicine

## 2020-03-06 NOTE — Progress Notes (Signed)
  Chronic Care Management   Outreach Note  03/06/2020 Name: JAMIRIA LANGILL MRN: [id number] DOB: 1940-09-25  Referred by: [name], MD Reason for referral : No chief complaint on file.   An unsuccessful telephone outreach was attempted today. The patient was referred to the pharmacist for assistance with care management and care coordination.   Follow Up Plan:   Carley Perdue [employer] Scheduler

## 2020-03-26 ENCOUNTER — Telehealth: Payer: Self-pay | Admitting: Family Medicine

## 2020-03-26 NOTE — Progress Notes (Signed)
  Chronic Care Management   Outreach Note  03/26/2020 Name: Jasmine Wilson MRN: [id number] DOB: 1941/03/07  Referred by: [name], MD Reason for referral : No chief complaint on file.   A second unsuccessful telephone outreach was attempted today. The patient was referred to pharmacist for assistance with care management and care coordination.  Follow Up Plan:   Carley Perdue [employer] Scheduler

## 2020-04-01 ENCOUNTER — Ambulatory Visit (INDEPENDENT_AMBULATORY_CARE_PROVIDER_SITE_OTHER): Payer: Medicare Other | Admitting: Family Medicine

## 2020-04-01 ENCOUNTER — Telehealth: Payer: Self-pay | Admitting: Family Medicine

## 2020-04-01 ENCOUNTER — Other Ambulatory Visit: Payer: Self-pay

## 2020-04-01 ENCOUNTER — Ambulatory Visit: Payer: Medicare Other | Attending: Internal Medicine

## 2020-04-01 ENCOUNTER — Encounter: Payer: Self-pay | Admitting: Family Medicine

## 2020-04-01 DIAGNOSIS — I10 Essential (primary) hypertension: Secondary | ICD-10-CM

## 2020-04-01 DIAGNOSIS — H8109 Meniere's disease, unspecified ear: Secondary | ICD-10-CM

## 2020-04-01 DIAGNOSIS — Z23 Encounter for immunization: Secondary | ICD-10-CM

## 2020-04-01 DIAGNOSIS — E782 Mixed hyperlipidemia: Secondary | ICD-10-CM

## 2020-04-01 NOTE — Telephone Encounter (Signed)
Pt dropped off copy of her Covid Vaccination card for provider to see and have on pt's chart. Document put at front office tray under providers name.

## 2020-04-01 NOTE — Progress Notes (Signed)
   Covid-19 Vaccination Clinic  Name:  Jasmine Wilson    MRN: [id number] DOB: [birth year]  04/01/2020  Jasmine Wilson was observed post Covid-19 immunization for 15 minutes without incident. She was provided with Vaccine Information Sheet and instruction to access the V-Safe system. Vaccinated By: Kristeen Miss.  Jasmine Wilson was instructed to call 911 with any severe reactions post vaccine: Marland Kitchen Difficulty breathing  . Swelling of face and throat  . A fast heartbeat  . A bad rash all over body  . Dizziness and weakness

## 2020-04-01 NOTE — Patient Instructions (Signed)
Meniere Disease  Meniere disease is an inner ear disorder. It causes attacks of a spinning sensation (vertigo), dizziness, and ringing in the ear (tinnitus). It also causes hearing loss and a feeling of fullness or pressure in the ear. This is a lifelong condition, and it may get worse over time. You may have drop attacks or severe dizziness that makes you fall. A drop attack is when you suddenly fall without losing consciousness and you quickly recover after a few seconds or minutes. What are the causes? This condition is caused by having too much of the fluid that is in your inner ear (endolymph). When fluid builds up in your inner ear, it affects the nerves that control balance and hearing. The reason for the fluid buildup is not known. Possible causes include:  Allergies.  An abnormal reaction of the body's defense system (autoimmune disease).  Viral infection of the inner ear.  Head injury. What increases the risk? You are more likely to develop this condition if:  You are older than age 40.  You have a family history of Meniere disease.  You have a history of autoimmune disease.  You have a history of migraine headaches. What are the signs or symptoms? Symptoms of this condition can come and go and may last for up to 4 hours at a time. Symptoms usually start in one ear. They may become more frequent and eventually involve both ears. Symptoms can include:  Fullness and pressure in your ear.  Roaring or ringing in your ear.  Vertigo and loss of balance.  Dizziness.  Decreased hearing.  Nausea and vomiting. How is this diagnosed? This condition is diagnosed based on:  A physical exam.  Tests , such as: ? A hearing test (audiogram). ? An electronystagmogram. This tests your balance nerve (vestibular nerve). ? Imaging studies of your inner ear, such as CT scan or MRI. ? Other balance tests, such as rotational or balance platform tests. How is this treated? There is  no cure for this condition, but treatment can help to manage your symptoms. Treatment may include:  A low-salt diet. Limiting salt may help to reduce fluid in the body and relieve symptoms.  Oral or injected medicines to reduce or control: ? Vertigo. ? Nausea. ? Fluid retention. ? Dizziness.  Use of an air pressure pulse generator. This is a machine that sends small pressure pulses into your ear canal.  Hearing aids.  Inner ear surgery. This is rare. When you have symptoms, it can be helpful to lie down on a flat surface and focus your eyes on one object that does not move. Try to stay in that position until your symptoms go away. Follow these instructions at home: Eating and drinking  Eat the same amount of food at the same time every day, including snacks.  Do not skip meals.  Avoid caffeine.  Drink enough fluids to keep your urine clear or pale yellow.  Limit alcoholic drinks to one drink a day for non-pregnant women and 2 drinks a day for men. One drink equals 12 oz of beer, 5 oz of wine, or 1 oz of hard liquor.  Limit the salt (sodium) in your diet as told by your health care provider. Check ingredients and nutrition facts on packaged foods and beverages.  Do not eat foods that contain monosodium glutamate (MSG). General instructions  Do not use any products that contain nicotine or tobacco, such as cigarettes and e-cigarettes. If you need help quitting, ask your   health care provider.  Take over-the-counter and prescription medicines only as told by your health care provider.  Find ways to reduce or avoid stress. If you need help with this, ask your health care provider.  Do not drive if you have vertigo or dizziness. Contact a health care provider if:  You have symptoms that last longer than 4 hours.  You have new or worse symptoms. Get help right away if:  You have been vomiting for 24 hours.  You cannot keep fluids down.  You have chest pain or trouble  breathing. Summary  Meniere disease is an inner ear disorder. It causes attacks of a spinning sensation (vertigo), dizziness, and ringing in the ear (tinnitus). It also causes hearing loss and a feeling of fullness or pressure in the ear.  Symptoms of this condition can come and go and may last for up to 4 hours at a time.  When you have symptoms, it can be helpful to lie down on a flat surface and focus your eyes on one object that does not move. Try to stay in that position until your symptoms go away. This information is not intended to replace advice given to you by your health care provider. Make sure you discuss any questions you have with your health care provider. Document Revised: 05/20/2017 Document Reviewed: 04/28/2016 Elsevier Patient Education  2020 [employer] 

## 2020-04-01 NOTE — Telephone Encounter (Signed)
thanks

## 2020-04-02 NOTE — Assessment & Plan Note (Signed)
Long standing and stable 

## 2020-04-02 NOTE — Assessment & Plan Note (Signed)
Well controlled, no changes to meds. Encouraged heart healthy diet such as the DASH diet and exercise as tolerated.  °

## 2020-04-02 NOTE — Progress Notes (Signed)
Subjective:    Patient ID: Jasmine Wilson, female    DOB: 1940-09-22, 79 y.o.   MRN: [id number]  Chief Complaint  Patient presents with  . Follow-up    Blood Pressure    HPI Patient is in today for follow up on chronic medical concerns. No recent febrile illness or hospitalizations. Her blood pressure has improved. No acute concerns. Denies CP/palp/SOB/HA/congestion/fevers/GI or GU c/o. Taking meds as prescribed  Past Medical History:  Diagnosis Date  . Abdominal pain 03/02/2011  . Arthritis 03/02/2012  . Bronchitis 04/04/2011  . Cataract   . Chicken pox as a child  . Cough 01/29/2013  . Female bladder prolapse, acquired 01/29/2011  . Glaucoma 01/29/2011  . Hearing aid worn   . History of pneumonia   . HTN (hypertension) 01/29/2011  . Hyperlipidemia   . Measles as a child  . Meniere's disease   . Pedal edema 04/01/2016  . Preventative health care 01/29/2011  . Shingles   . Vaginosis 01/29/2013    Past Surgical History:  Procedure Laterality Date  . BREAST BIOPSY Left   . lump removed on left breast     clogged milk duct, benign  . TUBAL LIGATION      Family History  Problem Relation Age of Onset  . Breast cancer Mother   . Stroke Father   . Heart disease Father   . Hyperlipidemia Father   . Hypertension Father   . Breast cancer Sister 69  . Uterine cancer Maternal Grandmother        uterus    Social History   Socioeconomic History  . Marital status: Widowed    Spouse name: Not on file  . Number of children: 3  . Years of education: Not on file  . Highest education level: Not on file  Occupational History  . Occupation: Retired  Tobacco Use  . Smoking status: Former Smoker    Types: Cigarettes    Quit date: 06/21/1970    Years since quitting: 49.8  . Smokeless tobacco: Never Used  . Tobacco comment: occasional smoker  Vaping Use  . Vaping Use: Never used  Substance and Sexual Activity  . Alcohol use: No  . Drug use: No  . Sexual activity: Never    Other Topics Concern  . Not on file  Social History Narrative  . Not on file   Social Determinants of Health   Financial Resource Strain:   . Difficulty of Paying Living Expenses: Not on file  Food Insecurity:   . Worried About Charity fundraiser in the Last Year: Not on file  . Ran Out of Food in the Last Year: Not on file  Transportation Needs:   . Lack of Transportation (Medical): Not on file  . Lack of Transportation (Non-Medical): Not on file  Physical Activity:   . Days of Exercise per Week: Not on file  . Minutes of Exercise per Session: Not on file  Stress:   . Feeling of Stress : Not on file  Social Connections:   . Frequency of Communication with Friends and Family: Not on file  . Frequency of Social Gatherings with Friends and Family: Not on file  . Attends Religious Services: Not on file  . Active Member of Clubs or Organizations: Not on file  . Attends Archivist Meetings: Not on file  . Marital Status: Not on file  Intimate Partner Violence:   . Fear of Current or Ex-Partner: Not on file  .  Emotionally Abused: Not on file  . Physically Abused: Not on file  . Sexually Abused: Not on file    Outpatient Medications Prior to Visit  Medication Sig Dispense Refill  . atorvastatin (LIPITOR) 10 MG tablet TAKE 1 TABLET BY MOUTH  DAILY 90 tablet 1  . cetirizine (ZYRTEC) 10 MG tablet Take 1 tablet (10 mg total) by mouth daily as needed for allergies or rhinitis (cough). 90 tablet 1  . dorzolamide (TRUSOPT) 2 % ophthalmic solution Place 1 drop into both eyes 2 (two) times daily.     Marland Kitchen latanoprost (XALATAN) 0.005 % ophthalmic solution Place 1 drop into both eyes at bedtime.     . metoprolol succinate (TOPROL-XL) 100 MG 24 hr tablet TAKE 1 TABLET BY MOUTH  DAILY WITH OR IMMEDIATELY  FOLLOWING A MEAL 90 tablet 1  . timolol (BETIMOL) 0.5 % ophthalmic solution Place 1 drop into both eyes 2 (two) times daily.     No facility-administered medications prior to visit.     Allergies  Allergen Reactions  . Codeine Other (See Comments)    Makes feel funny    Review of Systems  Constitutional: Negative for chills, fever and malaise/fatigue.  HENT: Negative for congestion and hearing loss.   Eyes: Negative for discharge.  Respiratory: Negative for cough, sputum production and shortness of breath.   Cardiovascular: Negative for chest pain, palpitations and leg swelling.  Gastrointestinal: Negative for abdominal pain, blood in stool, constipation, diarrhea, heartburn, nausea and vomiting.  Genitourinary: Negative for dysuria, frequency, hematuria and urgency.  Musculoskeletal: Negative for back pain, falls and myalgias.  Skin: Negative for rash.  Neurological: Negative for dizziness, sensory change, loss of consciousness, weakness and headaches.  Endo/Heme/Allergies: Negative for environmental allergies. Does not bruise/bleed easily.  Psychiatric/Behavioral: Negative for depression and suicidal ideas. The patient is not nervous/anxious and does not have insomnia.        Objective:    Physical Exam Vitals and nursing note reviewed.  Constitutional:      General: She is not in acute distress.    Appearance: She is well-developed.  HENT:     Head: Normocephalic and atraumatic.     Nose: Nose normal.  Eyes:     General:        Right eye: No discharge.        Left eye: No discharge.  Cardiovascular:     Rate and Rhythm: Normal rate and regular rhythm.     Heart sounds: No murmur heard.   Pulmonary:     Effort: Pulmonary effort is normal.     Breath sounds: Normal breath sounds.  Abdominal:     General: Bowel sounds are normal.     Palpations: Abdomen is soft.     Tenderness: There is no abdominal tenderness.  Musculoskeletal:     Cervical back: Normal range of motion and neck supple.  Skin:    General: Skin is warm and dry.  Neurological:     Mental Status: She is alert and oriented to person, place, and time.     BP 138/84 (BP  Location: Right Arm, Patient Position: Sitting, Cuff Size: Large)   Pulse 89   Temp 98 F (36.7 C) (Oral)   Wt 191 lb 3.2 oz (86.7 kg)   SpO2 96%   BMI 29.07 kg/m  Wt Readings from Last 3 Encounters:  04/01/20 191 lb 3.2 oz (86.7 kg)  12/04/19 197 lb 11.2 oz (89.7 kg)  07/31/19 200 lb (90.7 kg)  Diabetic Foot Exam - Simple   No data filed     Lab Results  Component Value Date   WBC 6.7 12/26/2019   HGB 15.0 12/26/2019   HCT 44.4 12/26/2019   PLT 238.0 12/26/2019   GLUCOSE 103 (H) 12/26/2019   CHOL 159 12/26/2019   TRIG 171.0 (H) 12/26/2019   HDL 56.60 12/26/2019   LDLDIRECT 82.0 04/01/2016   LDLCALC 68 12/26/2019   ALT 16 12/26/2019   AST 16 12/26/2019   NA 139 12/26/2019   K 4.4 12/26/2019   CL 104 12/26/2019   CREATININE 0.84 12/26/2019   BUN 16 12/26/2019   CO2 26 12/26/2019   TSH 1.83 12/26/2019   HGBA1C 5.6 12/26/2019    Lab Results  Component Value Date   TSH 1.83 12/26/2019   Lab Results  Component Value Date   WBC 6.7 12/26/2019   HGB 15.0 12/26/2019   HCT 44.4 12/26/2019   MCV 90.5 12/26/2019   PLT 238.0 12/26/2019   Lab Results  Component Value Date   NA 139 12/26/2019   K 4.4 12/26/2019   CO2 26 12/26/2019   GLUCOSE 103 (H) 12/26/2019   BUN 16 12/26/2019   CREATININE 0.84 12/26/2019   BILITOT 0.6 12/26/2019   ALKPHOS 90 12/26/2019   AST 16 12/26/2019   ALT 16 12/26/2019   PROT 6.3 12/26/2019   ALBUMIN 4.4 12/26/2019   CALCIUM 9.4 12/26/2019   GFR 65.43 12/26/2019   Lab Results  Component Value Date   CHOL 159 12/26/2019   Lab Results  Component Value Date   HDL 56.60 12/26/2019   Lab Results  Component Value Date   LDLCALC 68 12/26/2019   Lab Results  Component Value Date   TRIG 171.0 (H) 12/26/2019   Lab Results  Component Value Date   CHOLHDL 3 12/26/2019   Lab Results  Component Value Date   HGBA1C 5.6 12/26/2019       Assessment & Plan:   Problem List Items Addressed This Visit    Hyperlipidemia     Encouraged heart healthy diet, increase exercise, avoid trans fats, consider a krill oil cap daily      Meniere's disease    Long standing and stable       HTN (hypertension)    Well controlled, no changes to meds. Encouraged heart healthy diet such as the DASH diet and exercise as tolerated.          I am having Jasmine Wilson maintain her latanoprost, dorzolamide, timolol, cetirizine, atorvastatin, and metoprolol succinate.  No orders of the defined types were placed in this encounter.    Penni Homans, MD

## 2020-04-02 NOTE — Assessment & Plan Note (Signed)
Encouraged heart healthy diet, increase exercise, avoid trans fats, consider a krill oil cap daily 

## 2020-04-23 ENCOUNTER — Telehealth: Payer: Self-pay | Admitting: Family Medicine

## 2020-04-23 NOTE — Telephone Encounter (Signed)
Patient states she would like a referral to dermatology. She is ok to go to [facility] as long is not with the same person (unsure of who that person is)

## 2020-04-24 ENCOUNTER — Other Ambulatory Visit: Payer: Self-pay | Admitting: Family Medicine

## 2020-04-24 DIAGNOSIS — L578 Other skin changes due to chronic exposure to nonionizing radiation: Secondary | ICD-10-CM

## 2020-04-25 NOTE — Telephone Encounter (Signed)
I sent referral yesterday

## 2020-04-25 NOTE — Telephone Encounter (Signed)
Pt contacted and updated on where referral process was

## 2020-05-09 DIAGNOSIS — L738 Other specified follicular disorders: Secondary | ICD-10-CM | POA: Diagnosis not present

## 2020-05-09 DIAGNOSIS — L570 Actinic keratosis: Secondary | ICD-10-CM | POA: Diagnosis not present

## 2020-05-14 DIAGNOSIS — H401131 Primary open-angle glaucoma, bilateral, mild stage: Secondary | ICD-10-CM | POA: Diagnosis not present

## 2020-05-28 ENCOUNTER — Telehealth: Payer: Self-pay

## 2020-05-28 NOTE — Telephone Encounter (Signed)
Attempted to call pt to reschedule appt for 08/05/2020 to another date.

## 2020-05-30 NOTE — Telephone Encounter (Signed)
error 

## 2020-06-11 ENCOUNTER — Telehealth: Payer: Self-pay

## 2020-06-11 NOTE — Telephone Encounter (Signed)
Attempted to call pt's daughter to get pt's appointment rescheduled from 08/05/20 to another date. LVM to call back

## 2020-06-27 DIAGNOSIS — L570 Actinic keratosis: Secondary | ICD-10-CM | POA: Diagnosis not present

## 2020-06-27 DIAGNOSIS — L853 Xerosis cutis: Secondary | ICD-10-CM | POA: Diagnosis not present

## 2020-06-27 DIAGNOSIS — L720 Epidermal cyst: Secondary | ICD-10-CM | POA: Diagnosis not present

## 2020-06-27 DIAGNOSIS — D1801 Hemangioma of skin and subcutaneous tissue: Secondary | ICD-10-CM | POA: Diagnosis not present

## 2020-06-27 DIAGNOSIS — L821 Other seborrheic keratosis: Secondary | ICD-10-CM | POA: Diagnosis not present

## 2020-08-05 ENCOUNTER — Other Ambulatory Visit: Payer: Self-pay | Admitting: Family Medicine

## 2020-08-05 ENCOUNTER — Ambulatory Visit: Payer: Medicare Other | Admitting: Family Medicine

## 2020-09-10 DIAGNOSIS — H33321 Round hole, right eye: Secondary | ICD-10-CM | POA: Diagnosis not present

## 2020-09-10 DIAGNOSIS — H401131 Primary open-angle glaucoma, bilateral, mild stage: Secondary | ICD-10-CM | POA: Diagnosis not present

## 2020-09-30 ENCOUNTER — Ambulatory Visit: Payer: Medicare Other | Admitting: Family Medicine

## 2020-10-09 DIAGNOSIS — H401131 Primary open-angle glaucoma, bilateral, mild stage: Secondary | ICD-10-CM | POA: Diagnosis not present

## 2020-11-06 ENCOUNTER — Ambulatory Visit (INDEPENDENT_AMBULATORY_CARE_PROVIDER_SITE_OTHER): Payer: Medicare Other | Admitting: Family Medicine

## 2020-11-06 ENCOUNTER — Other Ambulatory Visit: Payer: Self-pay

## 2020-11-06 ENCOUNTER — Encounter: Payer: Self-pay | Admitting: Family Medicine

## 2020-11-06 VITALS — BP 114/74 | HR 85 | Temp 98.2°F | Resp 16 | Wt 195.8 lb

## 2020-11-06 DIAGNOSIS — I10 Essential (primary) hypertension: Secondary | ICD-10-CM

## 2020-11-06 DIAGNOSIS — R739 Hyperglycemia, unspecified: Secondary | ICD-10-CM | POA: Diagnosis not present

## 2020-11-06 DIAGNOSIS — L578 Other skin changes due to chronic exposure to nonionizing radiation: Secondary | ICD-10-CM | POA: Diagnosis not present

## 2020-11-06 DIAGNOSIS — R519 Headache, unspecified: Secondary | ICD-10-CM | POA: Diagnosis not present

## 2020-11-06 DIAGNOSIS — L570 Actinic keratosis: Secondary | ICD-10-CM

## 2020-11-06 DIAGNOSIS — E782 Mixed hyperlipidemia: Secondary | ICD-10-CM | POA: Diagnosis not present

## 2020-11-06 DIAGNOSIS — H409 Unspecified glaucoma: Secondary | ICD-10-CM

## 2020-11-06 DIAGNOSIS — U071 COVID-19: Secondary | ICD-10-CM

## 2020-11-06 MED ORDER — LEVOCETIRIZINE DIHYDROCHLORIDE 5 MG PO TABS: 5.0000 mg | ORAL_TABLET | Freq: Every evening | ORAL | 1 refills | Status: DC | PRN

## 2020-11-06 MED ORDER — FLUTICASONE PROPIONATE 50 MCG/ACT NA SUSP: 2.0000 | Freq: Every day | NASAL | 1 refills | Status: DC

## 2020-11-06 NOTE — Patient Instructions (Addendum)
MIND diet   Benefiber to beverages twice daily, add probiotics daily for a month or track  Vitamin D 2000 IU daily  Diarrhea, Adult Diarrhea is frequent loose and watery bowel movements. Diarrhea can make you feel weak and cause you to become dehydrated. Dehydration can make you tired and thirsty, cause you to have a dry mouth, and decrease how often you urinate. Diarrhea typically lasts 2-3 days. However, it can last longer if it is a sign of something more serious. It is important to treat your diarrhea as told by your health care provider. Follow these instructions at home: Eating and drinking Follow these recommendations as told by your health care provider:  Take an oral rehydration solution (ORS). This is an over-the-counter medicine that helps return your body to its normal balance of nutrients and water. It is found at pharmacies and retail stores.  Drink plenty of fluids, such as water, ice chips, diluted fruit juice, and low-calorie sports drinks. You can drink milk also, if desired.  Avoid drinking fluids that contain a lot of sugar or caffeine, such as energy drinks, sports drinks, and soda.  Eat bland, easy-to-digest foods in small amounts as you are able. These foods include bananas, applesauce, rice, lean meats, toast, and crackers.  Avoid alcohol.  Avoid spicy or fatty foods.      Medicines  Take over-the-counter and prescription medicines only as told by your health care provider.  If you were prescribed an antibiotic medicine, take it as told by your health care provider. Do not stop using the antibiotic even if you start to feel better. General instructions  Wash your hands often using soap and water. If soap and water are not available, use a hand sanitizer. Others in the household should wash their hands as well. Hands should be washed: ? After using the toilet or changing a diaper. ? Before preparing, cooking, or serving food. ? While caring for a sick person  or while visiting someone in a hospital.  Drink enough fluid to keep your urine pale yellow.  Rest at home while you recover.  Watch your condition for any changes.  Take a warm bath to relieve any burning or pain from frequent diarrhea episodes.  Keep all follow-up visits as told by your health care provider. This is important.   Contact a health care provider if:  You have a fever.  Your diarrhea gets worse.  You have new symptoms.  You cannot keep fluids down.  You feel light-headed or dizzy.  You have a headache.  You have muscle cramps. Get help right away if:  You have chest pain.  You feel extremely weak or you faint.  You have bloody or black stools or stools that look like tar.  You have severe pain, cramping, or bloating in your abdomen.  You have trouble breathing or you are breathing very quickly.  Your heart is beating very quickly.  Your skin feels cold and clammy.  You feel confused.  You have signs of dehydration, such as: ? Dark urine, very little urine, or no urine. ? Cracked lips. ? Dry mouth. ? Sunken eyes. ? Sleepiness. ? Weakness. Summary  Diarrhea is frequent loose and watery bowel movements. Diarrhea can make you feel weak and cause you to become dehydrated.  Drink enough fluids to keep your urine pale yellow.  Make sure that you wash your hands after using the toilet. If soap and water are not available, use hand sanitizer.  Contact a  health care provider if your diarrhea gets worse or you have new symptoms.  Get help right away if you have signs of dehydration. This information is not intended to replace advice given to you by your health care provider. Make sure you discuss any questions you have with your health care provider. Document Revised: 10/24/2018 Document Reviewed: 11/11/2017 Elsevier Patient Education  2021 [employer].

## 2020-11-06 NOTE — Progress Notes (Signed)
Patient ID: Jasmine Wilson, female    DOB: 05/31/1941  Age: 80 y.o. 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 She expresses concern regarding her recent visit to her dermatologist for her actinic keratosis on her upper right ear and reports that the treatment she had has not helped it go away and a result she states that she is planning on visiting again to get it checked. She denies any chest pain, SOB, fever, abdominal pain, cough, chills, sore throat, dysuria, urinary incontinence, back pain, or N/V. She reports that her optometrist Dr. Macarthur Critchley prescribed her two different eye drops to see if they can help get her left eye pressure down and she states that she will follow up with him soon. She reports that she has had COVID last February and states that she experienced night sweats, chills, HA's, and Diarrhea. She expresses concern regarding experiencing HA's and Diarrhea, as those symptoms remained after her COVID infection. She endorses taking Asprin 325 mg BID which she states helps with the HA's. She endorses hydrating as well. She reports that she is having an overactive bladder and states that she has to wake up in the middle of the night every 2 hours to urinate. She reports feeling heartburn.   Review of Systems  Constitutional: Negative for chills, fatigue and fever.  HENT: Negative for congestion, rhinorrhea, sinus pressure, sinus pain and sore throat.        (+) heartburn  Eyes: Negative for pain.  Respiratory: Negative for cough and shortness of breath.   Cardiovascular: Negative for chest pain, palpitations and leg swelling.  Gastrointestinal: Positive for diarrhea. Negative for abdominal pain, blood in stool, nausea and vomiting.  Genitourinary: Positive for urgency (overactive bladder). Negative for decreased urine volume, flank pain, frequency, vaginal bleeding  and vaginal discharge.  Musculoskeletal: Negative for back pain.  Neurological: Negative for headaches.    History Past Medical History:  Diagnosis Date  . Abdominal pain 03/02/2011  . Arthritis 03/02/2012  . Bronchitis 04/04/2011  . Cataract   . Chicken pox as a child  . Cough 01/29/2013  . Female bladder prolapse, acquired 01/29/2011  . Glaucoma 01/29/2011  . Hearing aid worn   . History of pneumonia   . HTN (hypertension) 01/29/2011  . Hyperlipidemia   . Measles as a child  . Meniere's disease   . Pedal edema 04/01/2016  . Preventative health care 01/29/2011  . Shingles   . Vaginosis 01/29/2013    She has a past surgical history that includes lump removed on left breast; Tubal ligation; and Breast biopsy (Left).   Her family history includes Breast cancer in her mother; Breast cancer (age of onset: 27) in her sister; Heart disease in her father; Hyperlipidemia in her father; Hypertension in her father; Stroke in her father; Uterine cancer in her maternal grandmother.She reports that she quit smoking about 50 years ago. Her smoking use included cigarettes. She has never used smokeless tobacco. She reports that she does not drink alcohol and does not use drugs.  Current Outpatient Medications on File Prior to Visit  Medication Sig Dispense Refill  . atorvastatin (LIPITOR) 10 MG tablet TAKE 1 TABLET BY MOUTH  DAILY 90 tablet 3  . cetirizine (ZYRTEC) 10 MG tablet Take 1 tablet (10 mg total) by mouth daily as needed for allergies or rhinitis (cough). 90 tablet 1  .  dorzolamide (TRUSOPT) 2 % ophthalmic solution Place 1 drop into both eyes 2 (two) times daily.     Marland Kitchen latanoprost (XALATAN) 0.005 % ophthalmic solution Place 1 drop into both eyes at bedtime.     . metoprolol succinate (TOPROL-XL) 100 MG 24 hr tablet TAKE 1 TABLET BY MOUTH  DAILY WITH OR IMMEDIATELY  FOLLOWING A MEAL 90 tablet 3  . timolol (BETIMOL) 0.5 % ophthalmic solution Place 1 drop into both eyes 2 (two) times daily.      No current facility-administered medications on file prior to visit.     Objective:  Objective  Physical Exam Constitutional:      General: She is not in acute distress.    Appearance: Normal appearance. She is not ill-appearing or toxic-appearing.  HENT:     Head: Normocephalic and atraumatic.     Right Ear: Tympanic membrane, ear canal and external ear normal.     Left Ear: Tympanic membrane, ear canal and external ear normal.     Nose: No congestion or rhinorrhea.  Eyes:     Extraocular Movements: Extraocular movements intact.     Pupils: Pupils are equal, round, and reactive to light.  Cardiovascular:     Rate and Rhythm: Normal rate and regular rhythm.     Pulses: Normal pulses.     Heart sounds: Normal heart sounds. No murmur heard.   Pulmonary:     Effort: Pulmonary effort is normal. No respiratory distress.     Breath sounds: Normal breath sounds. No wheezing, rhonchi or rales.  Abdominal:     General: Bowel sounds are normal.     Palpations: Abdomen is soft. There is no mass.     Tenderness: There is no abdominal tenderness. There is no guarding.     Hernia: No hernia is present.  Musculoskeletal:        General: Normal range of motion.     Cervical back: Normal range of motion and neck supple.  Skin:    General: Skin is warm and dry.  Neurological:     Mental Status: She is alert and oriented to person, place, and time.  Psychiatric:        Behavior: Behavior normal.    BP 114/74   Pulse 85   Temp 98.2 F (36.8 C)   Resp 16   Wt 195 lb 12.8 oz (88.8 kg)   SpO2 96%   BMI 29.77 kg/m  Wt Readings from Last 3 Encounters:  11/06/20 195 lb 12.8 oz (88.8 kg)  04/01/20 191 lb 3.2 oz (86.7 kg)  12/04/19 197 lb 11.2 oz (89.7 kg)     Lab Results  Component Value Date   WBC 7.1 11/06/2020   HGB 15.2 (H) 11/06/2020   HCT 45.8 11/06/2020   PLT 245.0 11/06/2020   GLUCOSE 99 11/06/2020   CHOL 177 11/06/2020   TRIG 263.0 (H) 11/06/2020   HDL 57.60  11/06/2020   LDLDIRECT 79.0 11/06/2020   LDLCALC 68 12/26/2019   ALT 15 11/06/2020   AST 15 11/06/2020   NA 141 11/06/2020   K 4.5 11/06/2020   CL 105 11/06/2020   CREATININE 0.83 11/06/2020   BUN 13 11/06/2020   CO2 27 11/06/2020   TSH 2.26 11/06/2020   HGBA1C 5.7 11/06/2020    CT ANGIO NECK W OR WO CONTRAST  Result Date: 04/11/2019 CLINICAL DATA:  Follow-up carotid stenosis. Headache and visual disturbance. EXAM: CT ANGIOGRAPHY NECK TECHNIQUE: Multidetector CT imaging of the neck was performed using the  standard protocol during bolus administration of intravenous contrast. Multiplanar CT image reconstructions and MIPs were obtained to evaluate the vascular anatomy. Carotid stenosis measurements (when applicable) are obtained utilizing NASCET criteria, using the distal internal carotid diameter as the denominator. CONTRAST:  151mL OMNIPAQUE IOHEXOL 350 MG/ML SOLN COMPARISON:  Ultrasound 11/02/2018 FINDINGS: Aortic arch: Aortic atherosclerosis. No aneurysm or dissection. Branching pattern is normal without brachiocephalic vessel origin stenosis. Right carotid system: Common carotid artery widely patent to the bifurcation. Calcified plaque at the carotid bifurcation and proximal ICA bulb, but no stenosis. Right cervical ICA shows pronounced fibromuscular change at the C1 and C2 level, but 1 no evidence of 1 flow limiting stenosis or pseudo aneurysm. Left carotid system: Common carotid artery widely patent to the bifurcation. Calcified plaque at the carotid bifurcation and proximal ICA bulb but no stenosis. 3 cm beyond the bifurcation, the vessel is tortuous in there is a kink but this does not appear to be due to atherosclerotic plaque. At the C1 and C2 level, there is fibromuscular change of the internal carotid artery as seen on the right but no stenosis or pseudo aneurysm. I cannot specifically explain the noninvasive findings. Vertebral arteries: Both vertebral arteries are widely patent at  their origins and through the cervical region to the foramen magnum and basilar. Skeleton: Minimal cervical spondylosis. Other neck: No mass or lymphadenopathy. Small likely benign thyroid nodules. Upper chest: Normal IMPRESSION: Mild atherosclerotic disease at both carotid bifurcations with calcified plaque but no measurable stenosis of either proximal ICA. I do not see a finding to explain the suggestion of moderate stenosis of the left ICA from noninvasive study. There is a kink in the left ICA 3 cm distal to the bifurcation, but no atherosclerotic disease. Fibromuscular change affects both distal cervical ICAs but I do not see a flow limiting stenosis or pseudo aneurysm. Electronically Signed   By: Nelson Chimes M.D.   On: 04/11/2019 16:04     Assessment & Plan:  Plan  @[facility] @  Meds ordered this encounter  Medications  . fluticasone (FLONASE) 50 MCG/ACT nasal spray    Sig: Place 2 sprays into both nostrils daily.    Dispense:  48 g    Refill:  1  . levocetirizine (XYZAL) 5 MG tablet    Sig: Take 1 tablet (5 mg total) by mouth at bedtime as needed for allergies.    Dispense:  90 tablet    Refill:  1    Problem List Items Addressed This Visit    Hyperlipidemia    Encouraged heart healthy diet, increase exercise, avoid trans fats, consider a krill oil cap daily. Atorvastatin 10 mg qhs      Relevant Orders   Lipid panel (Completed)   HTN (hypertension)    Well controlled, no changes to meds. Encouraged heart healthy diet such as the DASH diet and exercise as tolerated.       Relevant Orders   CBC (Completed)   Comprehensive metabolic panel (Completed)   TSH (Completed)   Glaucoma    Continues to follow closely with opthamology       COVID    She had Covid in February and was notably ill but she is largely feeling recovered. She had significant headaches and diarrhea. She plans to get the booster shot soon.       Sun-damaged skin    She has a recurrent scaly lesion on her  right pinna that her dermatologist froze once but is still present. She agrees to  contact her deramtologist to have it reevaluated.       Nonintractable headache    Other Visit Diagnoses    AK (actinic keratosis)    -  Primary   Relevant Orders   Ambulatory referral to Dermatology   Hyperglycemia       Relevant Orders   Hemoglobin A1c (Completed)      Follow-up: Return in about 6 months (around 05/09/2021), or 6-8 months CPE.   I,David Hanna,acting as a scribe for Penni Homans, MD.,have documented all relevant documentation on the behalf of Penni Homans, MD,as directed by  Penni Homans, MD while in the presence of Penni Homans, MD.  I, [name], MD personally performed the services described in this documentation. All medical record entries made by the scribe were at my direction and in my presence. I have reviewed the chart and agree that the record reflects my personal performance and is accurate and complete

## 2020-11-07 LAB — COMPREHENSIVE METABOLIC PANEL
ALT: 15 U/L (ref 0–35)
AST: 15 U/L (ref 0–37)
Albumin: 4.3 g/dL (ref 3.5–5.2)
Alkaline Phosphatase: 92 U/L (ref 39–117)
BUN: 13 mg/dL (ref 6–23)
CO2: 27 mEq/L (ref 19–32)
Calcium: 9.4 mg/dL (ref 8.4–10.5)
Chloride: 105 mEq/L (ref 96–112)
Creatinine, Ser: 0.83 mg/dL (ref 0.40–1.20)
GFR: 66.91 mL/min (ref >60.00)
Glucose, Bld: 99 mg/dL (ref 70–99)
Potassium: 4.5 mEq/L (ref 3.5–5.1)
Sodium: 141 mEq/L (ref 135–145)
Total Bilirubin: 0.6 mg/dL (ref 0.2–1.2)
Total Protein: 6.4 g/dL (ref 6.0–8.3)

## 2020-11-07 LAB — CBC
HCT: 45.8 % (ref 36.0–46.0)
Hemoglobin: 15.2 g/dL — ABNORMAL HIGH (ref 12.0–15.0)
MCHC: 33.2 g/dL (ref 30.0–36.0)
MCV: 90.8 fl (ref 78.0–100.0)
Platelets: 245 10*3/uL (ref 150.0–400.0)
RBC: 5.04 Mil/uL (ref 3.87–5.11)
RDW: 13.9 % (ref 11.5–15.5)
WBC: 7.1 10*3/uL (ref 4.0–10.5)

## 2020-11-07 LAB — LIPID PANEL
Cholesterol: 177 mg/dL (ref 0–200)
HDL: 57.6 mg/dL (ref >39.00)
NonHDL: 119.78
Total CHOL/HDL Ratio: 3
Triglycerides: 263 mg/dL — ABNORMAL HIGH (ref 0.0–149.0)
VLDL: 52.6 mg/dL — ABNORMAL HIGH (ref 0.0–40.0)

## 2020-11-07 LAB — LDL CHOLESTEROL, DIRECT: Direct LDL: 79 mg/dL

## 2020-11-07 LAB — HEMOGLOBIN A1C: Hgb A1c MFr Bld: 5.7 % (ref 4.6–6.5)

## 2020-11-07 LAB — TSH: TSH: 2.26 u[IU]/mL (ref 0.35–4.50)

## 2020-11-08 NOTE — Assessment & Plan Note (Signed)
She has a recurrent scaly lesion on her right pinna that her dermatologist froze once but is still present. She agrees to contact her deramtologist to have it reevaluated.

## 2020-11-08 NOTE — Assessment & Plan Note (Signed)
Well controlled, no changes to meds. Encouraged heart healthy diet such as the DASH diet and exercise as tolerated.  °

## 2020-11-08 NOTE — Assessment & Plan Note (Signed)
Encouraged heart healthy diet, increase exercise, avoid trans fats, consider a krill oil cap daily. Atorvastatin 10 mg qhs

## 2020-11-08 NOTE — Assessment & Plan Note (Signed)
Continues to follow closely with opthamology

## 2020-11-08 NOTE — Assessment & Plan Note (Signed)
She had Covid in February and was notably ill but she is largely feeling recovered. She had significant headaches and diarrhea. She plans to get the booster shot soon.

## 2020-11-10 DIAGNOSIS — H401131 Primary open-angle glaucoma, bilateral, mild stage: Secondary | ICD-10-CM | POA: Diagnosis not present

## 2020-12-10 DIAGNOSIS — L570 Actinic keratosis: Secondary | ICD-10-CM | POA: Diagnosis not present

## 2020-12-10 DIAGNOSIS — D0421 Carcinoma in situ of skin of right ear and external auricular canal: Secondary | ICD-10-CM | POA: Diagnosis not present

## 2021-03-23 DIAGNOSIS — D0421 Carcinoma in situ of skin of right ear and external auricular canal: Secondary | ICD-10-CM | POA: Diagnosis not present

## 2021-03-23 DIAGNOSIS — L821 Other seborrheic keratosis: Secondary | ICD-10-CM | POA: Diagnosis not present

## 2021-03-23 DIAGNOSIS — Z85828 Personal history of other malignant neoplasm of skin: Secondary | ICD-10-CM | POA: Diagnosis not present

## 2021-05-08 ENCOUNTER — Other Ambulatory Visit: Payer: Self-pay | Admitting: Family Medicine

## 2021-05-18 DIAGNOSIS — H5213 Myopia, bilateral: Secondary | ICD-10-CM | POA: Diagnosis not present

## 2021-05-18 DIAGNOSIS — H26493 Other secondary cataract, bilateral: Secondary | ICD-10-CM | POA: Diagnosis not present

## 2021-05-22 NOTE — Progress Notes (Signed)
Subjective:   CAMIKA MARSICO is a 80 y.o. female who presents for Medicare Annual (Subsequent) preventive examination.    Review of Systems     Cardiac Risk Factors include: advanced age (>22men, >110 women);dyslipidemia;hypertension     Objective:    Today's Vitals   05/25/21 1306  BP: (!) 147/75  Pulse: 80  Resp: 16  Temp: (!) 97 F (36.1 C)  TempSrc: Temporal  SpO2: 96%  Weight: 196 lb 3.2 oz (89 kg)  Height: 5\' 8"  (1.727 m)   Body mass index is 29.83 kg/m.  Advanced Directives 05/25/2021 02/17/2018 01/04/2017 10/01/2014  Does Patient Have a Medical Advance Directive? No No No No  Would patient like information on creating a medical advance directive? No - Patient declined Yes (MAU/Ambulatory/Procedural Areas - Information given) No - Patient declined Yes - Educational materials given    Current Medications (verified) Outpatient Encounter Medications as of 05/25/2021  Medication Sig   atorvastatin (LIPITOR) 10 MG tablet TAKE 1 TABLET BY MOUTH  DAILY   dorzolamide (TRUSOPT) 2 % ophthalmic solution Place 1 drop into both eyes 2 (two) times daily.    fluticasone (FLONASE) 50 MCG/ACT nasal spray Place 2 sprays into both nostrils daily.   latanoprost (XALATAN) 0.005 % ophthalmic solution Place 1 drop into both eyes at bedtime.    levocetirizine (XYZAL) 5 MG tablet TAKE 1 TABLET BY MOUTH AT  BEDTIME AS NEEDED FOR  ALLERGIES   metoprolol succinate (TOPROL-XL) 100 MG 24 hr tablet TAKE 1 TABLET BY MOUTH  DAILY WITH OR IMMEDIATELY  FOLLOWING A MEAL   timolol (BETIMOL) 0.5 % ophthalmic solution Place 1 drop into both eyes 2 (two) times daily.   [DISCONTINUED] cetirizine (ZYRTEC) 10 MG tablet Take 1 tablet (10 mg total) by mouth daily as needed for allergies or rhinitis (cough).   No facility-administered encounter medications on file as of 05/25/2021.    Allergies (verified) Codeine   History: Past Medical History:  Diagnosis Date   Abdominal pain 03/02/2011   Arthritis  03/02/2012   Bronchitis 04/04/2011   Cataract    Chicken pox as a child   Cough 01/29/2013   Female bladder prolapse, acquired 01/29/2011   Glaucoma 01/29/2011   Hearing aid worn    History of pneumonia    HTN (hypertension) 01/29/2011   Hyperlipidemia    Measles as a child   Meniere's disease    Pedal edema 04/01/2016   Preventative health care 01/29/2011   Shingles    Vaginosis 01/29/2013   Past Surgical History:  Procedure Laterality Date   BREAST BIOPSY Left    lump removed on left breast     clogged milk duct, benign   TUBAL LIGATION     Family History  Problem Relation Age of Onset   Breast cancer Mother    Stroke Father    Heart disease Father    Hyperlipidemia Father    Hypertension Father    Breast cancer Sister 77   Uterine cancer Maternal Grandmother        uterus   Social History   Socioeconomic History   Marital status: Widowed    Spouse name: Not on file   Number of children: 3   Years of education: Not on file   Highest education level: Not on file  Occupational History   Occupation: Retired  Tobacco Use   Smoking status: Former    Types: Cigarettes    Quit date: 06/21/1970    Years since quitting: 50.9  Smokeless tobacco: Never   Tobacco comments:    occasional smoker  Vaping Use   Vaping Use: Never used  Substance and Sexual Activity   Alcohol use: No   Drug use: No   Sexual activity: Never  Other Topics Concern   Not on file  Social History Narrative   Not on file   Social Determinants of Health   Financial Resource Strain: Low Risk    Difficulty of Paying Living Expenses: Not hard at all  Food Insecurity: No Food Insecurity   Worried About Charity fundraiser in the Last Year: Never true   Cutter in the Last Year: Never true  Transportation Needs: No Transportation Needs   Lack of Transportation (Medical): No   Lack of Transportation (Non-Medical): No  Physical Activity: Not on file  Stress: No Stress Concern Present    Feeling of Stress : Not at all  Social Connections: Moderately Integrated   Frequency of Communication with Friends and Family: More than three times a week   Frequency of Social Gatherings with Friends and Family: More than three times a week   Attends Religious Services: More than 4 times per year   Active Member of [employer] or Organizations: Yes   Attends Archivist Meetings: More than 4 times per year   Marital Status: Widowed    Tobacco Counseling Counseling given: Not Answered Tobacco comments: occasional smoker   Clinical Intake:  Pre-visit preparation completed: Yes  Pain : No/denies pain     BMI - recorded: 29.83 Nutritional Status: BMI 25 -29 Overweight Nutritional Risks: None Diabetes: No  How often do you need to have someone help you when you read instructions, pamphlets, or other written materials from your doctor or pharmacy?: 1 - Never  Diabetic?No  Interpreter Needed?: No  Information entered by :: Caroleen Hamman LPN   Activities of Daily Living In your present state of health, do you have any difficulty performing the following activities: 05/25/2021 11/06/2020  Hearing? N N  Vision? N N  Difficulty concentrating or making decisions? N N  Walking or climbing stairs? Y N  Comment stairs -  Dressing or bathing? N N  Doing errands, shopping? N N  Preparing Food and eating ? N -  Using the Toilet? N -  In the past six months, have you accidently leaked urine? N -  Do you have problems with loss of bowel control? N -  Managing your Medications? N -  Managing your Finances? N -  Housekeeping or managing your Housekeeping? N -  Some recent data might be hidden    Patient Care Team: [name], MD as PCP - General (Family Medicine) Macarthur Critchley, [place] as Referring Physician (Optometry)  Indicate any recent Medical Services you may have received from other than [facility] providers in the past year (date may be approximate).     Assessment:    This is a routine wellness examination for [facility] [place].  Hearing/Vision screen Hearing Screening - Comments:: Bilateral hearing aids Vision Screening - Comments:: Last eye exam-04/2021-Dr. Nicki Reaper  Dietary issues and exercise activities discussed: Current Exercise Habits: The patient does not participate in regular exercise at present, Exercise limited by: None identified   Goals Addressed               This Visit's Progress     Patient Stated     Improve balance with new exercise (pt-stated)   Not on track     Other  Patient Stated        Increase exercise       Depression Screen PHQ 2/9 Scores 05/25/2021 04/01/2020 02/17/2018 04/04/2017 01/04/2017 04/01/2016 10/01/2014  PHQ - 2 Score 0 0 0 0 0 0 0    Fall Risk Fall Risk  05/25/2021 04/01/2020 02/17/2018 04/04/2017 01/04/2017  Falls in the past year? 1 0 No Yes No  Number falls in past yr: 0 0 - 1 -  Injury with Fall? 0 0 - Yes -  Comment - - - Knee, elbow,  -  Risk for fall due to : History of fall(s) - - - -  Follow up Falls prevention discussed - - - -    FALL RISK PREVENTION PERTAINING TO THE HOME:  Any stairs in or around the home? No  Home free of loose throw rugs in walkways, pet beds, electrical cords, etc? Yes  Adequate lighting in your home to reduce risk of falls? Yes   ASSISTIVE DEVICES UTILIZED TO PREVENT FALLS:  Life alert? Yes  Use of a cane, walker or w/c? No  Grab bars in the bathroom? Yes  Shower chair or bench in shower? Yes  Elevated toilet seat or a handicapped toilet? Yes   TIMED UP AND GO:  Was the test performed? Yes .  Length of time to ambulate 10 feet: 11 sec.   Gait steady and fast without use of assistive device  Cognitive Function:Normal cognitive status assessed by direct observation by this Nurse Health Advisor. No abnormalities found.   MMSE - Mini Mental State Exam 01/04/2017  Orientation to time 5  Orientation to Place 5  Registration 3  Attention/ Calculation 5  Recall 2   Language- name 2 objects 2  Language- repeat 1  Language- follow 3 step command 3  Language- read & follow direction 1  Write a sentence 1  Copy design 1  Total score 29        Immunizations Immunization History  Administered Date(s) Administered   PFIZER(Purple Top)SARS-COV-2 Vaccination 07/27/2019, 08/17/2019, 04/01/2020   Pneumococcal Conjugate-13 05/06/2015   Pneumococcal Polysaccharide-23 04/03/2018   Tdap 02/24/2012    TDAP status: Up to date  Flu Vaccine status: Declined, Education has been provided regarding the importance of this vaccine but patient still declined. Advised may receive this vaccine at local pharmacy or Health Dept. Aware to provide a copy of the vaccination record if obtained from local pharmacy or Health Dept. Verbalized acceptance and understanding.  Pneumococcal vaccine status: Up to date  Covid-19 vaccine status: Information provided on how to obtain vaccines.   Qualifies for Shingles Vaccine? Yes   Zostavax completed No   Shingrix Completed?: No.    Education has been provided regarding the importance of this vaccine. Patient has been advised to call insurance company to determine out of pocket expense if they have not yet received this vaccine. Advised may also receive vaccine at local pharmacy or Health Dept. Verbalized acceptance and understanding.  Screening Tests Health Maintenance  Topic Date Due   Zoster Vaccines- Shingrix (1 of 2) Never done   DEXA SCAN  Never done   COVID-19 Vaccine (4 - Booster for Pfizer series) 05/27/2020   TETANUS/TDAP  02/23/2022   Pneumonia Vaccine 4+ Years old  Completed   HPV VACCINES  Aged Out   INFLUENZA VACCINE  Discontinued    Health Maintenance  Health Maintenance Due  Topic Date Due   Zoster Vaccines- Shingrix (1 of 2) Never done   DEXA SCAN  Never  done   COVID-19 Vaccine (4 - Booster for Pfizer series) 05/27/2020    Colorectal cancer screening: No longer required.   Mammogram status:  Declined  Bone Density status: Declined  Lung Cancer Screening: (Low Dose CT Chest recommended if Age 30-80 years, 30 pack-year currently smoking OR have quit w/in 15years.) does not qualify.     Additional Screening:  Hepatitis C Screening: does not qualify  Vision Screening: Recommended annual ophthalmology exams for early detection of glaucoma and other disorders of the eye. Is the patient up to date with their annual eye exam?  Yes  Who is the provider or what is the name of the office in which the patient attends annual eye exams? Dr. Nicki Reaper   Dental Screening: Recommended annual dental exams for proper oral hygiene  Community Resource Referral / Chronic Care Management: CRR required this visit?  No   CCM required this visit?  No      Plan:     I have personally reviewed and noted the following in the patient's chart:   Medical and social history Use of alcohol, tobacco or illicit drugs  Current medications and supplements including opioid prescriptions.  Functional ability and status Nutritional status Physical activity Advanced directives List of other physicians Hospitalizations, surgeries, and ER visits in previous 12 months Vitals Screenings to include cognitive, depression, and falls Referrals and appointments  In addition, I have reviewed and discussed with patient certain preventive protocols, quality metrics, and best practice recommendations. A written personalized care plan for preventive services as well as general preventive health recommendations were provided to patient.     Marta Antu, LPN   17/10/1023  Nurse Health Advisor  Nurse Notes: None

## 2021-05-25 ENCOUNTER — Ambulatory Visit (INDEPENDENT_AMBULATORY_CARE_PROVIDER_SITE_OTHER): Payer: Medicare Other

## 2021-05-25 VITALS — BP 147/75 | HR 80 | Temp 97.0°F | Resp 16 | Ht 68.0 in | Wt 196.2 lb

## 2021-05-25 DIAGNOSIS — Z0000 Encounter for general adult medical examination without abnormal findings: Secondary | ICD-10-CM | POA: Diagnosis not present

## 2021-05-25 NOTE — Patient Instructions (Signed)
Ms. Bonham , Thank you for taking time to come for your Medicare Wellness Visit. I appreciate your ongoing commitment to your health goals. Please review the following plan we discussed and let me know if I can assist you in the future.   Screening recommendations/referrals: Colonoscopy: No longer required Mammogram: Declined  Bone Density: Declined Recommended yearly ophthalmology/optometry visit for glaucoma screening and checkup Recommended yearly dental visit for hygiene and checkup  Vaccinations: Influenza vaccine: Declined Pneumococcal vaccine: Up to date Tdap vaccine: Up to date Shingles vaccine: Discuss with pharmacy   Covid-19:Booster available at the pharmacy  Advanced directives: Please bring a copy of Living Will and/or [place] for your chart once completed.   Conditions/risks identified: See problem list  Next appointment: Follow up in one year for your annual wellness visit    Preventive Care 65 Years and Older, Female Preventive care refers to lifestyle choices and visits with your health care provider that can promote health and wellness. What does preventive care include? A yearly physical exam. This is also called an annual well check. Dental exams once or twice a year. Routine eye exams. Ask your health care provider how often you should have your eyes checked. Personal lifestyle choices, including: Daily care of your teeth and gums. Regular physical activity. Eating a healthy diet. Avoiding tobacco and drug use. Limiting alcohol use. Practicing safe sex. Taking low-dose aspirin every day. Taking vitamin and mineral supplements as recommended by your health care provider. What happens during an annual well check? The services and screenings done by your health care provider during your annual well check will depend on your age, overall health, lifestyle risk factors, and family history of disease. Counseling  Your health care provider  may ask you questions about your: Alcohol use. Tobacco use. Drug use. Emotional well-being. Home and relationship well-being. Sexual activity. Eating habits. History of falls. Memory and ability to understand (cognition). Work and work Statistician. Reproductive health. Screening  You may have the following tests or measurements: Height, weight, and BMI. Blood pressure. Lipid and cholesterol levels. These may be checked every 5 years, or more frequently if you are over 75 years old. Skin check. Lung cancer screening. You may have this screening every year starting at age 86 if you have a 30-pack-year history of smoking and currently smoke or have quit within the past 15 years. Fecal occult blood test (FOBT) of the stool. You may have this test every year starting at age 75. Flexible sigmoidoscopy or colonoscopy. You may have a sigmoidoscopy every 5 years or a colonoscopy every 10 years starting at age 2. Hepatitis C blood test. Hepatitis B blood test. Sexually transmitted disease (STD) testing. Diabetes screening. This is done by checking your blood sugar (glucose) after you have not eaten for a while (fasting). You may have this done every 1-3 years. Bone density scan. This is done to screen for osteoporosis. You may have this done starting at age 8. Mammogram. This may be done every 1-2 years. Talk to your health care provider about how often you should have regular mammograms. Talk with your health care provider about your test results, treatment options, and if necessary, the need for more tests. Vaccines  Your health care provider may recommend certain vaccines, such as: Influenza vaccine. This is recommended every year. Tetanus, diphtheria, and acellular pertussis (Tdap, Td) vaccine. You may need a Td booster every 10 years. Zoster vaccine. You may need this after age 29. Pneumococcal 13-valent conjugate (  PCV13) vaccine. One dose is recommended after age 66. Pneumococcal  polysaccharide (PPSV23) vaccine. One dose is recommended after age 8. Talk to your health care provider about which screenings and vaccines you need and how often you need them. This information is not intended to replace advice given to you by your health care provider. Make sure you discuss any questions you have with your health care provider. Document Released: 07/04/2015 Document Revised: 02/25/2016 Document Reviewed: 04/08/2015 Elsevier Interactive Patient Education  2017 [employer] Prevention in the Home Falls can cause injuries. They can happen to people of all ages. There are many things you can do to make your home safe and to help prevent falls. What can I do on the outside of my home? Regularly fix the edges of walkways and driveways and fix any cracks. Remove anything that might make you trip as you walk through a door, such as a raised step or threshold. Trim any bushes or trees on the path to your home. Use bright outdoor lighting. Clear any walking paths of anything that might make someone trip, such as rocks or tools. Regularly check to see if handrails are loose or broken. Make sure that both sides of any steps have handrails. Any raised decks and porches should have guardrails on the edges. Have any leaves, snow, or ice cleared regularly. Use sand or salt on walking paths during winter. Clean up any spills in your garage right away. This includes oil or grease spills. What can I do in the bathroom? Use night lights. Install grab bars by the toilet and in the tub and shower. Do not use towel bars as grab bars. Use non-skid mats or decals in the tub or shower. If you need to sit down in the shower, use a plastic, non-slip stool. Keep the floor dry. Clean up any water that spills on the floor as soon as it happens. Remove soap buildup in the tub or shower regularly. Attach bath mats securely with double-sided non-slip rug tape. Do not have throw rugs and other  things on the floor that can make you trip. What can I do in the bedroom? Use night lights. Make sure that you have a light by your bed that is easy to reach. Do not use any sheets or blankets that are too big for your bed. They should not hang down onto the floor. Have a firm chair that has side arms. You can use this for support while you get dressed. Do not have throw rugs and other things on the floor that can make you trip. What can I do in the kitchen? Clean up any spills right away. Avoid walking on wet floors. Keep items that you use a lot in easy-to-reach places. If you need to reach something above you, use a strong step stool that has a grab bar. Keep electrical cords out of the way. Do not use floor polish or wax that makes floors slippery. If you must use wax, use non-skid floor wax. Do not have throw rugs and other things on the floor that can make you trip. What can I do with my stairs? Do not leave any items on the stairs. Make sure that there are handrails on both sides of the stairs and use them. Fix handrails that are broken or loose. Make sure that handrails are as long as the stairways. Check any carpeting to make sure that it is firmly attached to the stairs. Fix any carpet that is  loose or worn. Avoid having throw rugs at the top or bottom of the stairs. If you do have throw rugs, attach them to the floor with carpet tape. Make sure that you have a light switch at the top of the stairs and the bottom of the stairs. If you do not have them, ask someone to add them for you. What else can I do to help prevent falls? Wear shoes that: Do not have high heels. Have rubber bottoms. Are comfortable and fit you well. Are closed at the toe. Do not wear sandals. If you use a stepladder: Make sure that it is fully opened. Do not climb a closed stepladder. Make sure that both sides of the stepladder are locked into place. Ask someone to hold it for you, if possible. Clearly  mark and make sure that you can see: Any grab bars or handrails. First and last steps. Where the edge of each step is. Use tools that help you move around (mobility aids) if they are needed. These include: Canes. Walkers. Scooters. Crutches. Turn on the lights when you go into a dark area. Replace any light bulbs as soon as they burn out. Set up your furniture so you have a clear path. Avoid moving your furniture around. If any of your floors are uneven, fix them. If there are any pets around you, be aware of where they are. Review your medicines with your doctor. Some medicines can make you feel dizzy. This can increase your chance of falling. Ask your doctor what other things that you can do to help prevent falls. This information is not intended to replace advice given to you by your health care provider. Make sure you discuss any questions you have with your health care provider. Document Released: 04/03/2009 Document Revised: 11/13/2015 Document Reviewed: 07/12/2014 Elsevier Interactive Patient Education  2017 [employer].

## 2021-06-25 ENCOUNTER — Encounter: Payer: Medicare Other | Admitting: Family Medicine

## 2021-06-25 ENCOUNTER — Encounter: Payer: Self-pay | Admitting: Gastroenterology

## 2021-06-30 DIAGNOSIS — D1801 Hemangioma of skin and subcutaneous tissue: Secondary | ICD-10-CM | POA: Diagnosis not present

## 2021-06-30 DIAGNOSIS — D0461 Carcinoma in situ of skin of right upper limb, including shoulder: Secondary | ICD-10-CM | POA: Diagnosis not present

## 2021-06-30 DIAGNOSIS — L853 Xerosis cutis: Secondary | ICD-10-CM | POA: Diagnosis not present

## 2021-06-30 DIAGNOSIS — L821 Other seborrheic keratosis: Secondary | ICD-10-CM | POA: Diagnosis not present

## 2021-06-30 DIAGNOSIS — L814 Other melanin hyperpigmentation: Secondary | ICD-10-CM | POA: Diagnosis not present

## 2021-07-23 DIAGNOSIS — D0461 Carcinoma in situ of skin of right upper limb, including shoulder: Secondary | ICD-10-CM | POA: Diagnosis not present

## 2021-08-18 ENCOUNTER — Other Ambulatory Visit: Payer: Self-pay | Admitting: Family Medicine

## 2021-09-15 DIAGNOSIS — H401131 Primary open-angle glaucoma, bilateral, mild stage: Secondary | ICD-10-CM | POA: Diagnosis not present

## 2021-09-16 ENCOUNTER — Telehealth: Payer: Self-pay | Admitting: Family Medicine

## 2021-09-16 ENCOUNTER — Encounter: Payer: Self-pay | Admitting: Family Medicine

## 2021-09-16 ENCOUNTER — Ambulatory Visit (INDEPENDENT_AMBULATORY_CARE_PROVIDER_SITE_OTHER): Payer: Medicare Other | Admitting: Family Medicine

## 2021-09-16 VITALS — BP 154/69 | HR 76 | Ht 68.0 in | Wt 196.0 lb

## 2021-09-16 DIAGNOSIS — H538 Other visual disturbances: Secondary | ICD-10-CM

## 2021-09-16 DIAGNOSIS — R519 Headache, unspecified: Secondary | ICD-10-CM

## 2021-09-16 DIAGNOSIS — G8929 Other chronic pain: Secondary | ICD-10-CM | POA: Diagnosis not present

## 2021-09-16 NOTE — Telephone Encounter (Signed)
Pt stated when she saw her eye dr yesterday she informed them she has been having trouble focusing her eyes. Her dr was concerned she may have had a mini stroke and to schedule with pcp immediately. Scheduled her to see taylor today, she did not want to speak with a triage nurse.  ?

## 2021-09-16 NOTE — Patient Instructions (Addendum)
MRI ordered - someone will call you to schedule  ?Updating basic labs today  ? ?

## 2021-09-16 NOTE — Telephone Encounter (Signed)
Apt today with Jasmine Wilson. ?

## 2021-09-16 NOTE — Progress Notes (Signed)
? ?Acute Office Visit ? ?Subjective:  ? ? Patient ID: Jasmine Wilson, female    DOB: 07-22-1940, 81 y.o.   MRN: [id number] ? ?CC: blurred vision  ? ? ?HPI ?Patient is in today for blurred vision.  ? ?Patient reports that she went to see her eye doctor yesterday and mention to him that in February she had several episodes of bilateral blurred vision that lasted for 3 to 4 minutes.  States happen a couple times a week for 1 or 2 weeks.  Reports the eye doctor recommended she see PCP to consider brain abnormalities.  Otherwise her exam was at baseline, glaucoma etc. ? ?Patient reports that when the episodes happened she was sitting reading/watching TV when suddenly both eyes became blurry and felt like they " did not work."  She closed each eye when a time to make sure it was happening in both eyes.  States it resolved on its own in 3 to 4 minutes.  Has not occurred since the couple episodes that happened in February.  She cannot pinpoint any triggers, no dehydration or hunger at that time.  States the only similar factor is that she was sitting down reading all day which she does often. ? ? ?Additionally she reports she has been having a lot of headaches lately.  States that for the past 6 months she has woken up with a headache every day.  She takes Tylenol and that typically helps to relieve it however will return later in the day for the next morning.  States that sometimes this would happen with season changes and she was not initially alarmed by the frequency of the headaches.  She denies any asymmetrical weakness, slurred speech, gait abnormalities, weakness, falling.  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 ? ? ? ?Past Medical History:  ?Diagnosis Date  ? Abdominal pain 03/02/2011  ? Arthritis 03/02/2012  ? Bronchitis 04/04/2011  ? Cataract   ? Chicken pox as a child  ? Cough 01/29/2013  ? Female bladder prolapse, acquired 01/29/2011  ?  Glaucoma 01/29/2011  ? Hearing aid worn   ? History of pneumonia   ? HTN (hypertension) 01/29/2011  ? Hyperlipidemia   ? Measles as a child  ? Meniere's disease   ? Pedal edema 04/01/2016  ? Preventative health care 01/29/2011  ? Shingles   ? Vaginosis 01/29/2013  ? ? ?Past Surgical History:  ?Procedure Laterality Date  ? BREAST BIOPSY Left   ? lump removed on left breast    ? clogged milk duct, benign  ? TUBAL LIGATION    ? ? ?Family History  ?Problem Relation Age of Onset  ? Breast cancer Mother   ? Stroke Father   ? Heart disease Father   ? Hyperlipidemia Father   ? Hypertension Father   ? Breast cancer Sister 77  ? Uterine cancer Maternal Grandmother   ?     uterus  ? ? ?Social History  ? ?Socioeconomic History  ? Marital status: Widowed  ?  Spouse name: Not on file  ? Number of children: 3  ? Years of education: Not on file  ? Highest education level: Not on file  ?Occupational History  ? Occupation: Retired  ?Tobacco Use  ? Smoking status: Former  ?  Types: Cigarettes  ?  Quit date: 06/21/1970  ?  Years since quitting: 51.2  ? Smokeless tobacco: Never  ?  Tobacco comments:  ?  occasional smoker  ?Vaping Use  ? Vaping Use: Never used  ?Substance and Sexual Activity  ? Alcohol use: No  ? Drug use: No  ? Sexual activity: Never  ?Other Topics Concern  ? Not on file  ?Social History Narrative  ? Not on file  ? ?Social Determinants of Health  ? ?Financial Resource Strain: Low Risk   ? Difficulty of Paying Living Expenses: Not hard at all  ?Food Insecurity: No Food Insecurity  ? Worried About Charity fundraiser in the Last Year: Never true  ? Ran Out of Food in the Last Year: Never true  ?Transportation Needs: No Transportation Needs  ? Lack of Transportation (Medical): No  ? Lack of Transportation (Non-Medical): No  ?Physical Activity: Not on file  ?Stress: No Stress Concern Present  ? Feeling of Stress : Not at all  ?Social Connections: Moderately Integrated  ? Frequency of Communication with Friends and Family: More  than three times a week  ? Frequency of Social Gatherings with Friends and Family: More than three times a week  ? Attends Religious Services: More than 4 times per year  ? Active Member of Clubs or Organizations: Yes  ? Attends Archivist Meetings: More than 4 times per year  ? Marital Status: Widowed  ?Intimate Partner Violence: Not At Risk  ? Fear of Current or Ex-Partner: No  ? Emotionally Abused: No  ? Physically Abused: No  ? Sexually Abused: No  ? ? ?Outpatient Medications Prior to Visit  ?Medication Sig Dispense Refill  ? atorvastatin (LIPITOR) 10 MG tablet TAKE 1 TABLET BY MOUTH  DAILY 90 tablet 3  ? dorzolamide (TRUSOPT) 2 % ophthalmic solution Place 1 drop into both eyes 2 (two) times daily.     ? fluticasone (FLONASE) 50 MCG/ACT nasal spray Place 2 sprays into both nostrils daily. 48 g 1  ? latanoprost (XALATAN) 0.005 % ophthalmic solution Place 1 drop into both eyes at bedtime.     ? levocetirizine (XYZAL) 5 MG tablet TAKE 1 TABLET BY MOUTH AT  BEDTIME AS NEEDED FOR  ALLERGIES 90 tablet 1  ? metoprolol succinate (TOPROL-XL) 100 MG 24 hr tablet TAKE 1 TABLET BY MOUTH  DAILY WITH OR IMMEDIATELY  FOLLOWING A MEAL 90 tablet 0  ? timolol (BETIMOL) 0.5 % ophthalmic solution Place 1 drop into both eyes 2 (two) times daily.    ? ?No facility-administered medications prior to visit.  ? ? ?Allergies  ?Allergen Reactions  ? Codeine Other (See Comments)  ?  Makes feel funny  ? ? ?Review of Systems ?All review of systems negative except what is listed in the HPI ? ?   ?Objective:  ?  ?Physical Exam ?Vitals reviewed.  ?Constitutional:   ?   Appearance: Normal appearance.  ?HENT:  ?   Head: Normocephalic and atraumatic.  ?Cardiovascular:  ?   Rate and Rhythm: Normal rate and regular rhythm.  ?Pulmonary:  ?   Effort: Pulmonary effort is normal.  ?   Breath sounds: Normal breath sounds.  ?Musculoskeletal:  ?   Cervical back: Normal range of motion and neck supple.  ?Skin: ?   General: Skin is warm and dry.   ?Neurological:  ?   General: No focal deficit present.  ?   Mental Status: She is alert and oriented to person, place, and time. Mental status is at baseline.  ?   Cranial Nerves: No cranial nerve deficit.  ?   Sensory:  No sensory deficit.  ?   Motor: No weakness.  ?   Coordination: Coordination normal.  ?   Gait: Gait normal.  ?   Comments: NIHSS = 0  ?Psychiatric:     ?   Mood and Affect: Mood normal.     ?   Behavior: Behavior normal.     ?   Thought Content: Thought content normal.     ?   Judgment: Judgment normal.  ? ? ?BP (!) 154/69   Pulse 76   Ht '5\' 8"'$  (1.727 m)   Wt 196 lb (88.9 kg)   BMI 29.80 kg/m?  ?Wt Readings from Last 3 Encounters:  ?09/16/21 196 lb (88.9 kg)  ?05/25/21 196 lb 3.2 oz (89 kg)  ?11/06/20 195 lb 12.8 oz (88.8 kg)  ? ? ?Health Maintenance Due  ?Topic Date Due  ? Zoster Vaccines- Shingrix (1 of 2) Never done  ? DEXA SCAN  Never done  ? COVID-19 Vaccine (4 - Booster for Pfizer series) 05/27/2020  ? ? ?There are no preventive care reminders to display for this patient. ? ? ?Lab Results  ?Component Value Date  ? TSH 2.26 11/06/2020  ? ?Lab Results  ?Component Value Date  ? WBC 7.1 11/06/2020  ? HGB 15.2 (H) 11/06/2020  ? HCT 45.8 11/06/2020  ? MCV 90.8 11/06/2020  ? PLT 245.0 11/06/2020  ? ?Lab Results  ?Component Value Date  ? NA 141 11/06/2020  ? K 4.5 11/06/2020  ? CO2 27 11/06/2020  ? GLUCOSE 99 11/06/2020  ? BUN 13 11/06/2020  ? CREATININE 0.83 11/06/2020  ? BILITOT 0.6 11/06/2020  ? ALKPHOS 92 11/06/2020  ? AST 15 11/06/2020  ? ALT 15 11/06/2020  ? PROT 6.4 11/06/2020  ? ALBUMIN 4.3 11/06/2020  ? CALCIUM 9.4 11/06/2020  ? GFR 66.91 11/06/2020  ? ?Lab Results  ?Component Value Date  ? CHOL 177 11/06/2020  ? ?Lab Results  ?Component Value Date  ? HDL 57.60 11/06/2020  ? ?Lab Results  ?Component Value Date  ? [place] 68 12/26/2019  ? ?Lab Results  ?Component Value Date  ? TRIG 263.0 (H) 11/06/2020  ? ?Lab Results  ?Component Value Date  ? CHOLHDL 3 11/06/2020  ? ?Lab Results   ?Component Value Date  ? HGBA1C 5.7 11/06/2020  ? ? ?   ?Assessment & Plan:  ? ?1. Blurred vision, bilateral ?2. Nonintractable headache, unspecified chronicity pattern, unspecified headache type ?3. Chronic nonintractab

## 2021-09-17 LAB — COMPREHENSIVE METABOLIC PANEL
ALT: 15 U/L (ref 0–35)
AST: 16 U/L (ref 0–37)
Albumin: 4.4 g/dL (ref 3.5–5.2)
Alkaline Phosphatase: 92 U/L (ref 39–117)
BUN: 15 mg/dL (ref 6–23)
CO2: 27 mEq/L (ref 19–32)
Calcium: 10.1 mg/dL (ref 8.4–10.5)
Chloride: 102 mEq/L (ref 96–112)
Creatinine, Ser: 0.83 mg/dL (ref 0.40–1.20)
GFR: 66.5 mL/min (ref >60.00)
Glucose, Bld: 94 mg/dL (ref 70–99)
Potassium: 5.1 mEq/L (ref 3.5–5.1)
Sodium: 138 mEq/L (ref 135–145)
Total Bilirubin: 0.5 mg/dL (ref 0.2–1.2)
Total Protein: 6.4 g/dL (ref 6.0–8.3)

## 2021-09-17 LAB — LIPID PANEL
Cholesterol: 180 mg/dL (ref 0–200)
HDL: 61.2 mg/dL (ref >39.00)
NonHDL: 118.76
Total CHOL/HDL Ratio: 3
Triglycerides: 219 mg/dL — ABNORMAL HIGH (ref 0.0–149.0)
VLDL: 43.8 mg/dL — ABNORMAL HIGH (ref 0.0–40.0)

## 2021-09-17 LAB — HEMOGLOBIN A1C: Hgb A1c MFr Bld: 6 % (ref 4.6–6.5)

## 2021-09-17 LAB — CBC
HCT: 45.7 % (ref 36.0–46.0)
Hemoglobin: 14.9 g/dL (ref 12.0–15.0)
MCHC: 32.7 g/dL (ref 30.0–36.0)
MCV: 90.8 fl (ref 78.0–100.0)
Platelets: 273 10*3/uL (ref 150.0–400.0)
RBC: 5.03 Mil/uL (ref 3.87–5.11)
RDW: 14.5 % (ref 11.5–15.5)
WBC: 8.1 10*3/uL (ref 4.0–10.5)

## 2021-09-17 LAB — LDL CHOLESTEROL, DIRECT: Direct LDL: 87 mg/dL

## 2021-09-23 ENCOUNTER — Ambulatory Visit (HOSPITAL_BASED_OUTPATIENT_CLINIC_OR_DEPARTMENT_OTHER)
Admission: RE | Admit: 2021-09-23 | Discharge: 2021-09-23 | Disposition: A | Discharge status: Home / Self Care | Payer: Medicare Other | Source: Ambulatory Visit | Attending: Family Medicine | Admitting: Family Medicine

## 2021-09-23 DIAGNOSIS — H538 Other visual disturbances: Secondary | ICD-10-CM | POA: Diagnosis not present

## 2021-09-23 DIAGNOSIS — R9082 White matter disease, unspecified: Secondary | ICD-10-CM | POA: Insufficient documentation

## 2021-09-23 DIAGNOSIS — R519 Headache, unspecified: Secondary | ICD-10-CM | POA: Diagnosis not present

## 2021-09-23 DIAGNOSIS — G8929 Other chronic pain: Secondary | ICD-10-CM | POA: Diagnosis not present

## 2021-09-23 IMAGING — MR MR HEAD W/O CM
10 series · 48 of 48 positions shown · non-contrast
Comparison: No direct comparison study available. CTA neck
[DATE]

CLINICAL DATA: Chronic headache, episodic bilateral blurred vision
6 months

EXAM:
MRI HEAD WITHOUT CONTRAST
MRA HEAD WITHOUT CONTRAST
TECHNIQUE: Multiplanar, multi-echo pulse sequences of the brain and surrounding
structures were acquired without intravenous contrast. Angiographic
images of the Circle of Willis were acquired using MRA technique
without intravenous contrast.

[Series 2: T1 · sagittal · 5.0mm · 0.45mm/px · 3 of 25 slices shown (1 of 2)]
[im 1/25]
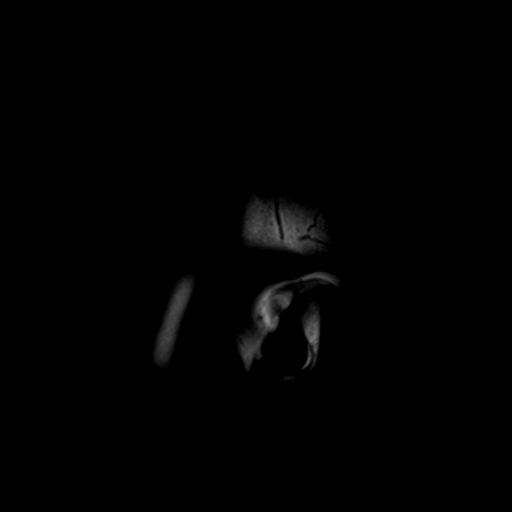
[im 13/25]
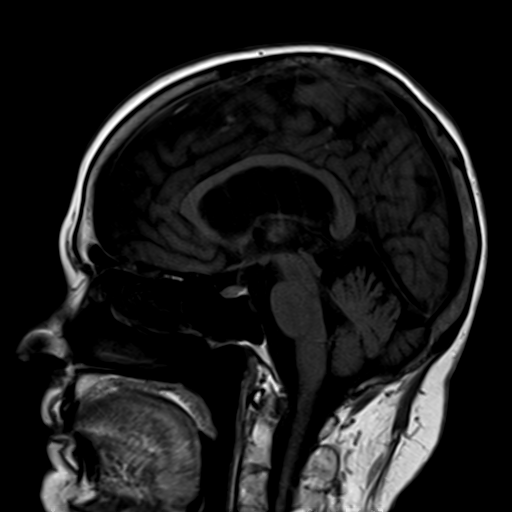
[im 25/25]
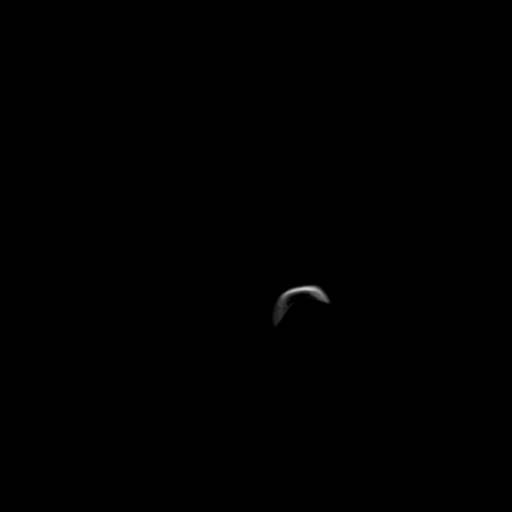

[Series 3: DWI · axial · 3.0mm · 1.20mm/px · z∈[-94,+68]mm · 9 of 110 slices shown (1 of 4)]
[im 1/110]
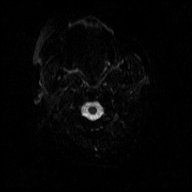
[im 14/110]
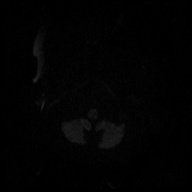
[im 28/110]
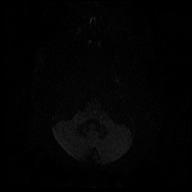
[im 41/110]
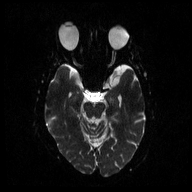
[im 55/110]
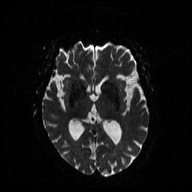
[im 69/110]
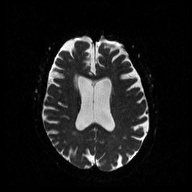
[im 82/110]
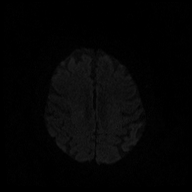
[im 96/110]
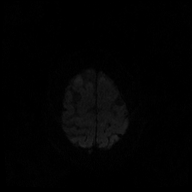
[im 110/110]
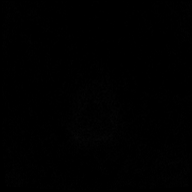

[Series 4: DWI · axial · 3.0mm · 1.20mm/px · z∈[-94,+68]mm · 4 of 55 slices shown (2 of 4)]
[im 1/55]
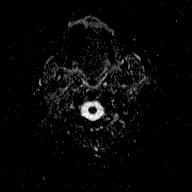
[im 19/55]
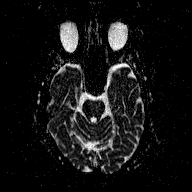
[im 37/55]
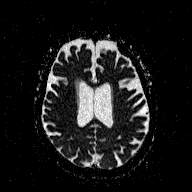
[im 55/55]
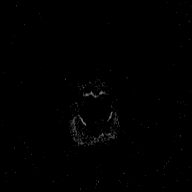

[Series 5: DWI · coronal · 5.0mm · 1.20mm/px · 5 of 67 slices shown (3 of 4)]
[im 1/67]
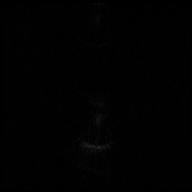
[im 17/67]
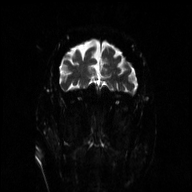
[im 34/67]
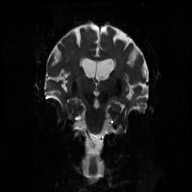
[im 50/67]
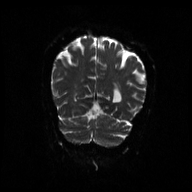
[im 67/67]
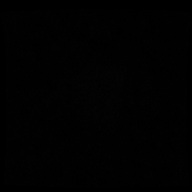

[Series 6: DWI · coronal · 5.0mm · 1.20mm/px · 3 of 34 slices shown (4 of 4)]
[im 1/34]
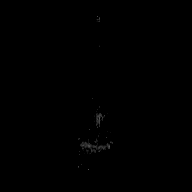
[im 17/34]
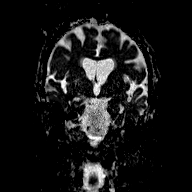
[im 34/34]
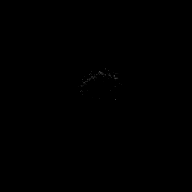

[Series 7: T2 · axial · 5.0mm · 0.69mm/px · z∈[-98,+70]mm · 2 of 25 slices shown (1 of 3)]
[im 1/25]
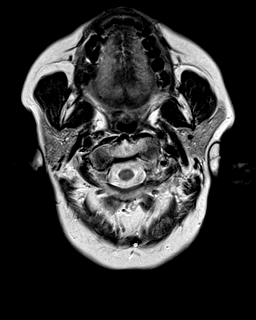
[im 25/25]
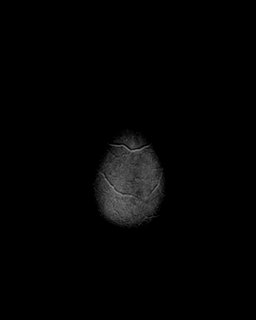

[Series 8: FLAIR · axial · 3.0mm · 0.43mm/px · z∈[-93,+69]mm · 4 of 55 slices shown]
[im 1/55]
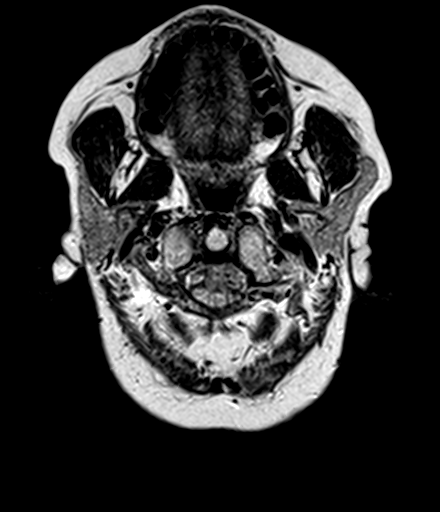
[im 19/55]
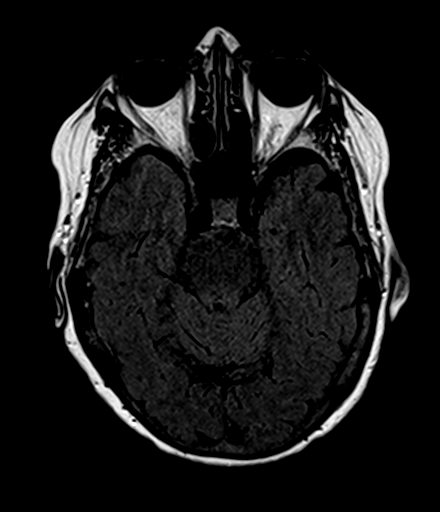
[im 37/55]
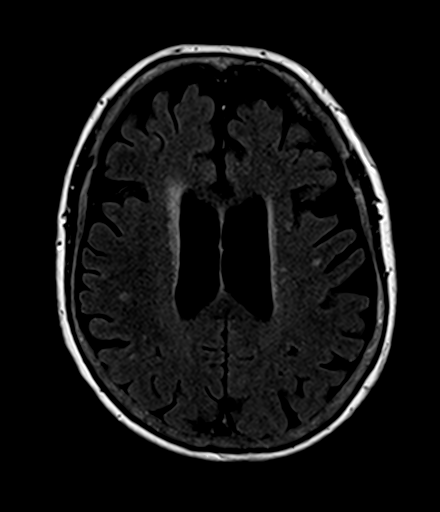
[im 55/55]
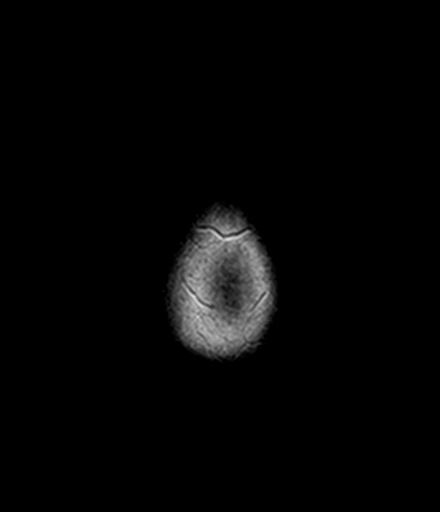

[Series 9: T2 · axial · 5.0mm · 0.69mm/px · z∈[-96,+72]mm · 2 of 25 slices shown (2 of 3)]
[im 1/25]
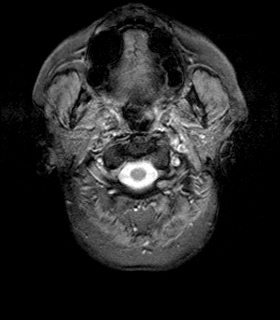
[im 25/25]
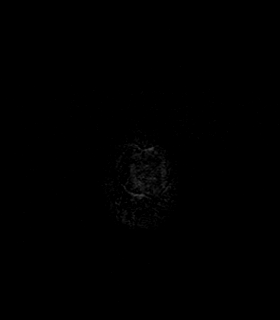

[Series 10: T1 · axial · 1.0mm · 0.94mm/px · z∈[-102,+73]mm · 14 of 176 slices shown (2 of 2)]
[im 1/176]
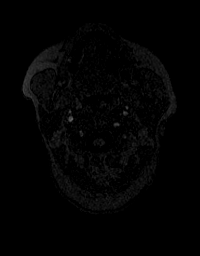
[im 14/176]
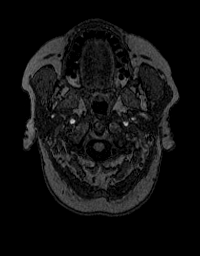
[im 27/176]
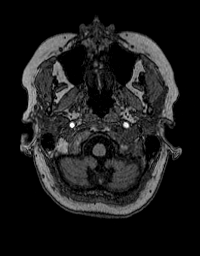
[im 41/176]
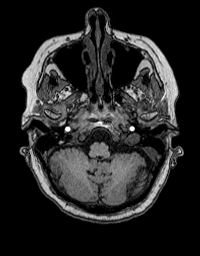
[im 54/176]
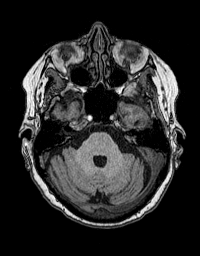
[im 68/176]
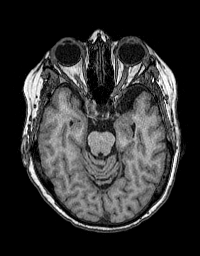
[im 81/176]
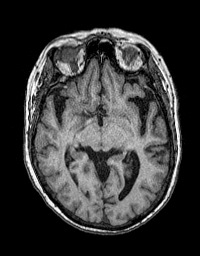
[im 95/176]
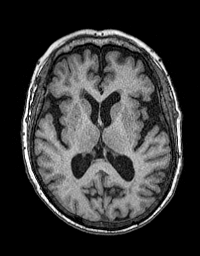
[im 108/176]
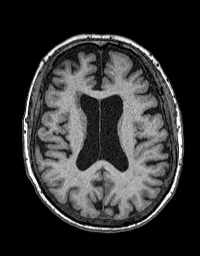
[im 122/176]
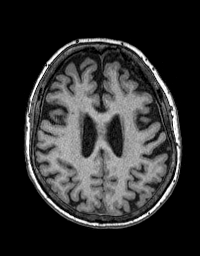
[im 135/176]
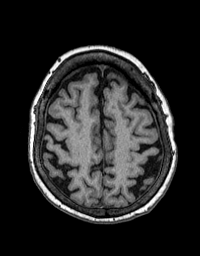
[im 149/176]
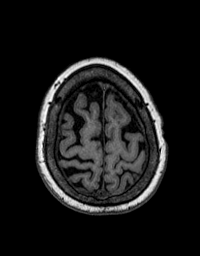
[im 162/176]
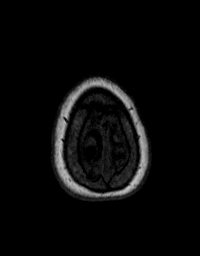
[im 176/176]
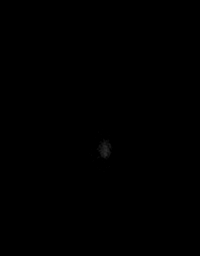

[Series 11: T2 · coronal · 5.0mm · 0.43mm/px · 2 of 31 slices shown (3 of 3)]
[im 1/31]
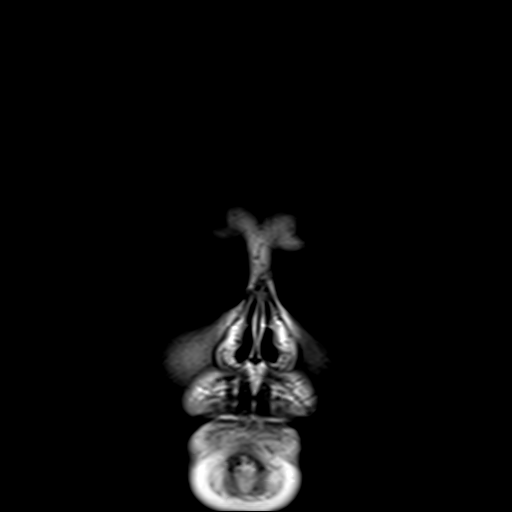
[im 31/31]
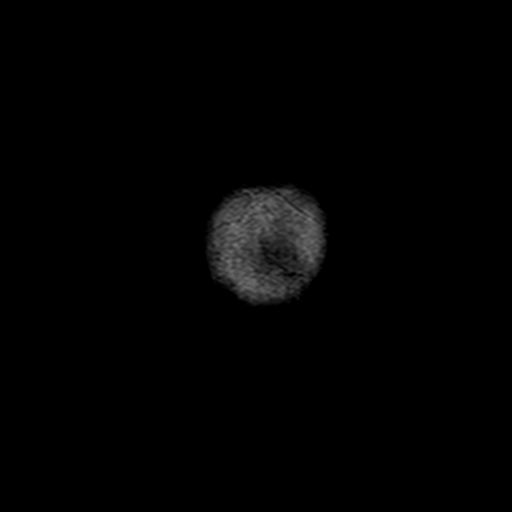

[48 of 48 positions shown; findings below may reference images not displayed]

FINDINGS: MRI HEAD FINDINGS

Brain: There is no evidence of acute intracranial hemorrhage,
extra-axial fluid collection, or acute infarct.

There is a background of mild global parenchymal volume loss with
prominence of the ventricular system and extra-axial CSF spaces.
Patchy small foci of FLAIR signal abnormality throughout the
subcortical and periventricular white matter are nonspecific but
likely reflects sequela of chronic white matter microangiopathy,
within expected limits for age. There is no suspicious parenchymal
signal abnormality. Gray-white differentiation is preserved.

There is no solid mass lesion. There is no mass effect or midline
shift.

Vascular: The major flow voids are preserved. The vasculature is
assessed in full below.

Skull and upper cervical spine: Normal marrow signal.

Sinuses/Orbits: The paranasal sinuses are clear. Bilateral lens
implants are in place. The globes and orbits are otherwise
unremarkable.

Other: None.

MRA HEAD FINDINGS

Anterior circulation: There is beading of the imaged upper cervical
internal carotid arteries consistent with fibromuscular dysplasia,
as seen on prior CTA neck from [[facility]]. The intracranial ICAs are
patent, without hemodynamically significant stenosis.

The bilateral MCAs are patent

The bilateral ACAs are patent.

There is no aneurysm or AVM.

Posterior circulation: The bilateral V4 segments are patent. PICA is
identified bilaterally. The basilar artery is patent.

The bilateral PCAs are patent. There is a fetal origin of the right
PCA. The left posterior communicating artery is not definitely seen.

There is no aneurysm or AVM.

Anatomic variants: As above.
IMPRESSION: 1. Unremarkable for age brain MRI with no acute intracranial
pathology. Background mild parenchymal volume loss and chronic white
matter microangiopathy.
2. Beading of the high cervical internal carotid arteries consistent
with fibromuscular dysplasia as seen on prior CTA neck from [[facility]].
3. Normal intracranial vasculature. No hemodynamically significant
stenosis or aneurysm identified.

## 2021-09-23 IMAGING — MR MR MRA HEAD W/O CM
1 series · 18 of 48 positions shown · non-contrast
Comparison: No direct comparison study available. CTA neck
[DATE]

CLINICAL DATA: Chronic headache, episodic bilateral blurred vision
6 months

EXAM:
MRI HEAD WITHOUT CONTRAST
MRA HEAD WITHOUT CONTRAST
TECHNIQUE: Multiplanar, multi-echo pulse sequences of the brain and surrounding
structures were acquired without intravenous contrast. Angiographic
images of the Circle of Willis were acquired using MRA technique
without intravenous contrast.

[Series 1: TOF · axial · non-contrast · 0.5mm · 0.39mm/px · z∈[-87,+24]mm · 18 of 232 slices shown]
[im 1/232]
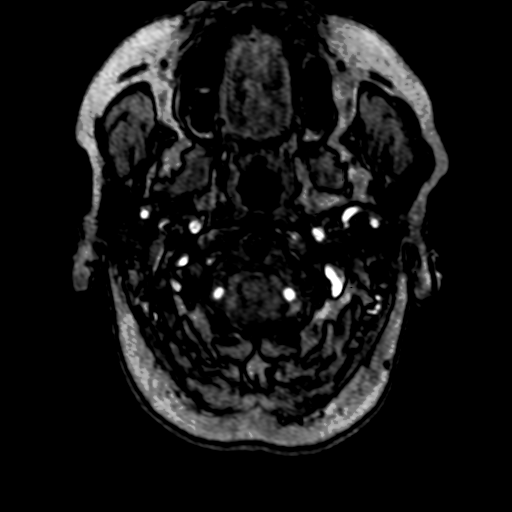
[im 5/232]
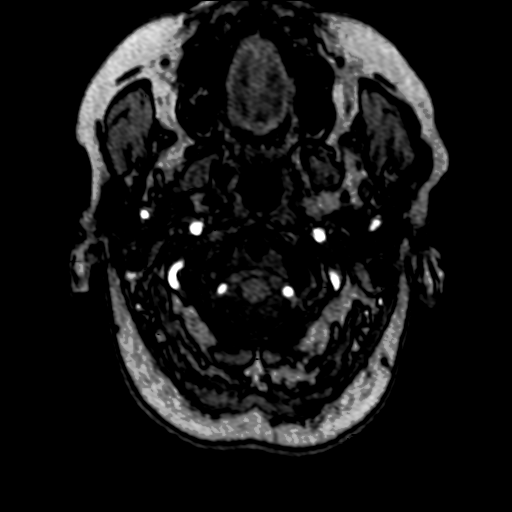
[im 10/232]
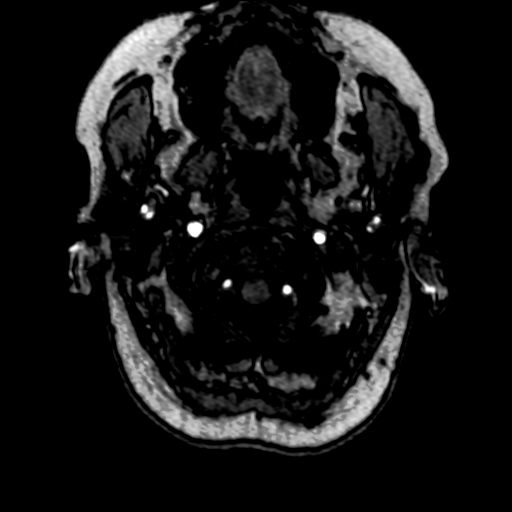
[im 15/232]
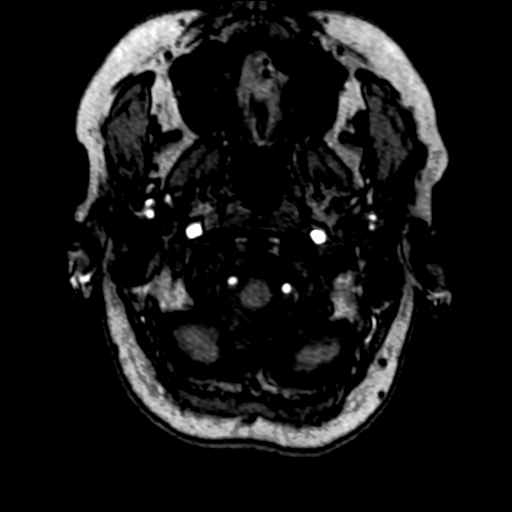
[im 20/232]
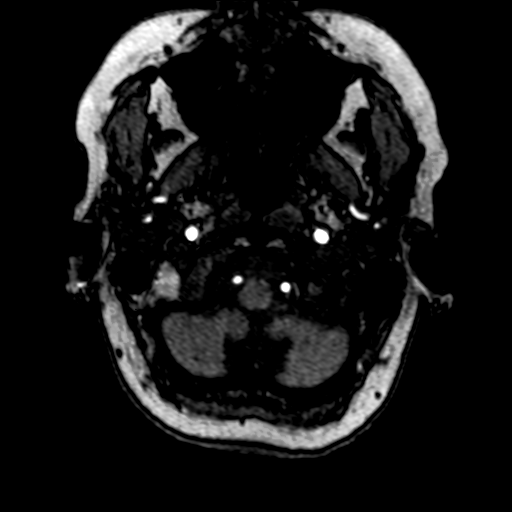
[im 25/232]
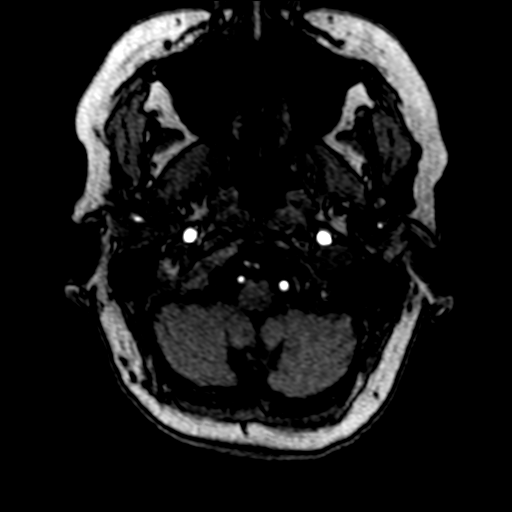
[im 30/232]
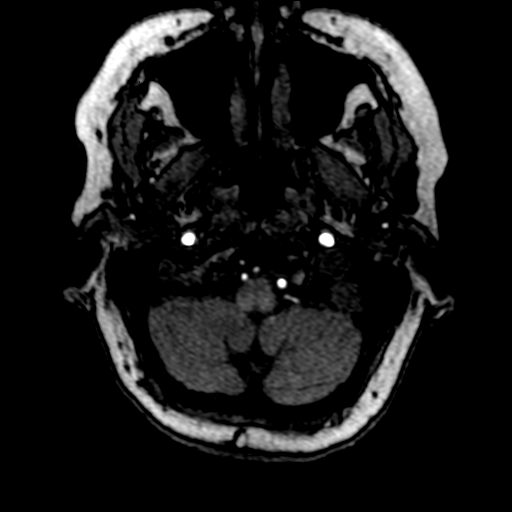
[im 35/232]
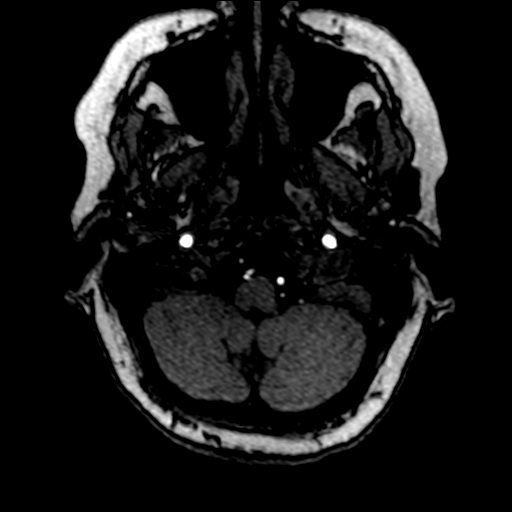
[im 40/232]
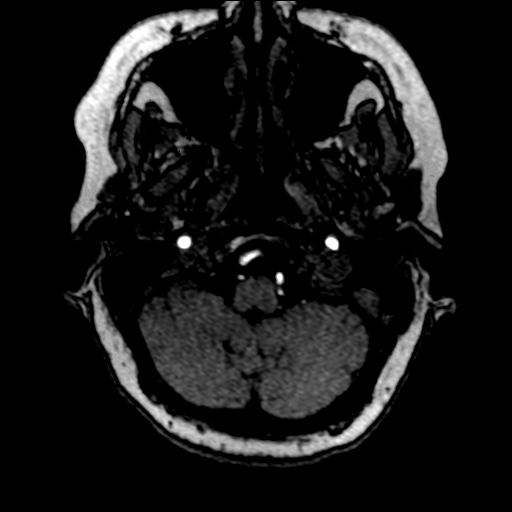
[im 45/232]
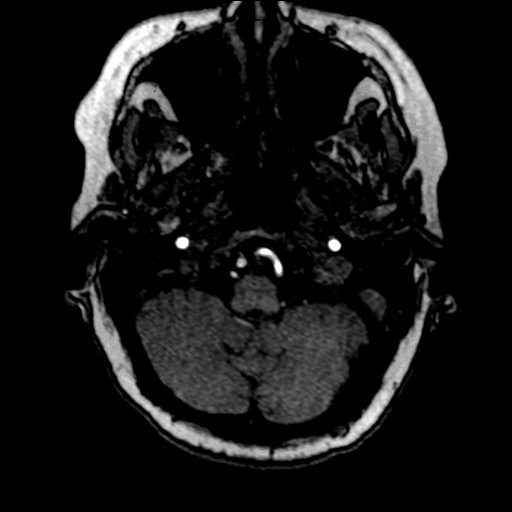
[im 74/232]
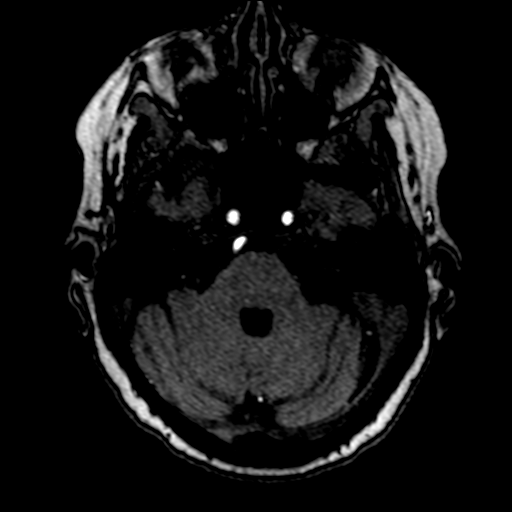
[im 104/232]
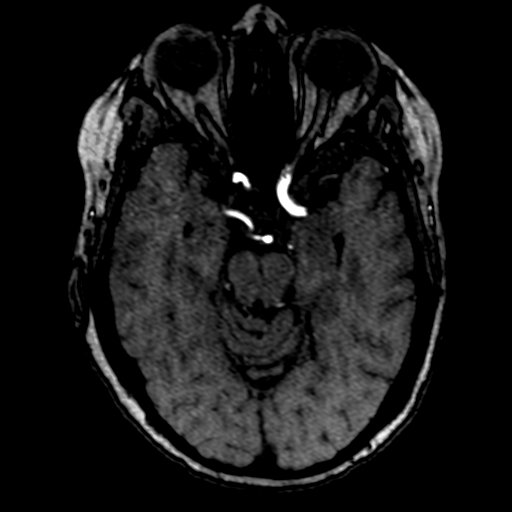
[im 118/232]
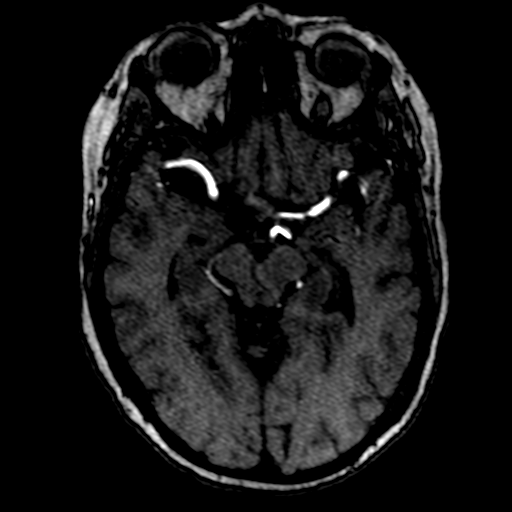
[im 133/232]
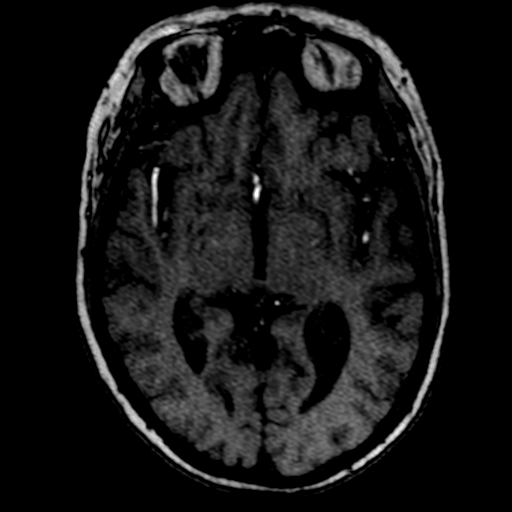
[im 163/232]
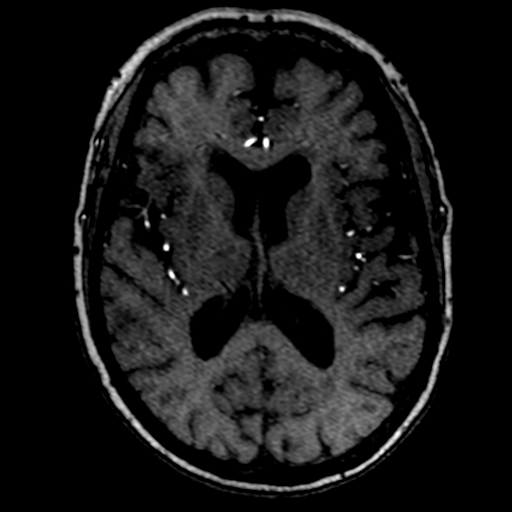
[im 192/232]
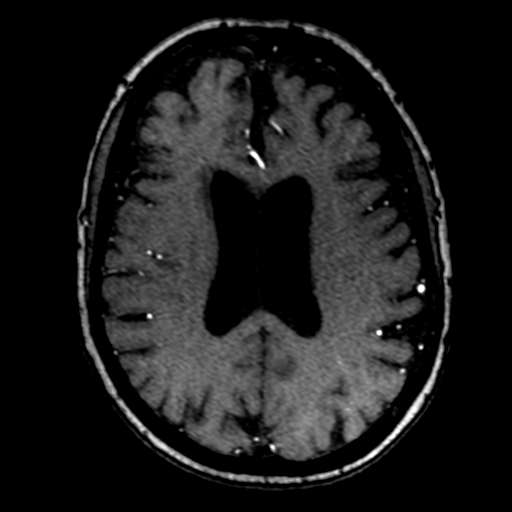
[im 197/232]
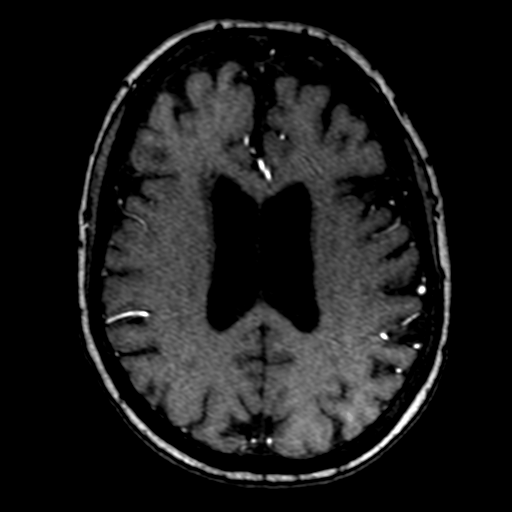
[im 222/232]
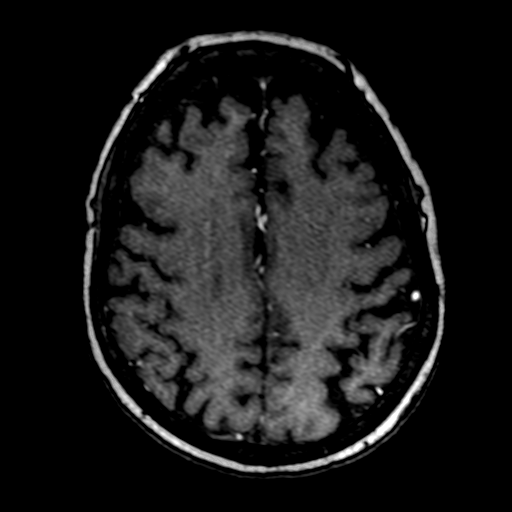

[18 of 48 positions shown; findings below may reference images not displayed]

FINDINGS: MRI HEAD FINDINGS

Brain: There is no evidence of acute intracranial hemorrhage,
extra-axial fluid collection, or acute infarct.

There is a background of mild global parenchymal volume loss with
prominence of the ventricular system and extra-axial CSF spaces.
Patchy small foci of FLAIR signal abnormality throughout the
subcortical and periventricular white matter are nonspecific but
likely reflects sequela of chronic white matter microangiopathy,
within expected limits for age. There is no suspicious parenchymal
signal abnormality. Gray-white differentiation is preserved.

There is no solid mass lesion. There is no mass effect or midline
shift.

Vascular: The major flow voids are preserved. The vasculature is
assessed in full below.

Skull and upper cervical spine: Normal marrow signal.

Sinuses/Orbits: The paranasal sinuses are clear. Bilateral lens
implants are in place. The globes and orbits are otherwise
unremarkable.

Other: None.

MRA HEAD FINDINGS

Anterior circulation: There is beading of the imaged upper cervical
internal carotid arteries consistent with fibromuscular dysplasia,
as seen on prior CTA neck from [[facility]]. The intracranial ICAs are
patent, without hemodynamically significant stenosis.

The bilateral MCAs are patent

The bilateral ACAs are patent.

There is no aneurysm or AVM.

Posterior circulation: The bilateral V4 segments are patent. PICA is
identified bilaterally. The basilar artery is patent.

The bilateral PCAs are patent. There is a fetal origin of the right
PCA. The left posterior communicating artery is not definitely seen.

There is no aneurysm or AVM.

Anatomic variants: As above.
IMPRESSION: 1. Unremarkable for age brain MRI with no acute intracranial
pathology. Background mild parenchymal volume loss and chronic white
matter microangiopathy.
2. Beading of the high cervical internal carotid arteries consistent
with fibromuscular dysplasia as seen on prior CTA neck from [[facility]].
3. Normal intracranial vasculature. No hemodynamically significant
stenosis or aneurysm identified.

## 2021-09-24 ENCOUNTER — Encounter: Payer: Self-pay | Admitting: *Deleted

## 2021-09-25 ENCOUNTER — Other Ambulatory Visit: Payer: Self-pay | Admitting: Family Medicine

## 2021-09-30 ENCOUNTER — Ambulatory Visit (INDEPENDENT_AMBULATORY_CARE_PROVIDER_SITE_OTHER): Payer: Medicare Other

## 2021-09-30 DIAGNOSIS — I10 Essential (primary) hypertension: Secondary | ICD-10-CM

## 2021-09-30 NOTE — Progress Notes (Signed)
Pt here for Blood pressure check per: BP was 154/69 in office on 09/16/2021 ?  ?Bp pressure was elevated today recommend she monitor at home and come back in 2 weeks for nurse visitised per  ? ?Pt currently takes:atorvastatin (LIPITOR) 10 MG tablet TAKE 1 TABLET BY MOUTH DAILY 90 tablet 3  ? ?metoprolol succinate (TOPROL-XL) 100 MG 24 hr tablet TAKE 1 TABLET BY MOUTH DAILY WITH OR IMMEDIATELY FOLLOWING A MEAL 90 tablet   ? ?Pt reports compliance with medication. ? ?BP today @ = 142/64 ?HR =87 ? ?Pt advise: No changes and check Bp at home, Follow up in 6 months. ?

## 2021-11-17 ENCOUNTER — Other Ambulatory Visit: Payer: Self-pay | Admitting: Family Medicine

## 2022-01-06 NOTE — Progress Notes (Deleted)
Subjective:    Patient ID: Jasmine Wilson, female    DOB: 1940/08/21, 81 y.o.   MRN: [id number]  No chief complaint on file.   HPI Patient is in today for a follow up.  Past Medical History:  Diagnosis Date   Abdominal pain 03/02/2011   Arthritis 03/02/2012   Bronchitis 04/04/2011   Cataract    Chicken pox as a child   Cough 01/29/2013   Female bladder prolapse, acquired 01/29/2011   Glaucoma 01/29/2011   Hearing aid worn    History of pneumonia    HTN (hypertension) 01/29/2011   Hyperlipidemia    Measles as a child   Meniere's disease    Pedal edema 04/01/2016   Preventative health care 01/29/2011   Shingles    Vaginosis 01/29/2013    Past Surgical History:  Procedure Laterality Date   BREAST BIOPSY Left    lump removed on left breast     clogged milk duct, benign   TUBAL LIGATION      Family History  Problem Relation Age of Onset   Breast cancer Mother    Stroke Father    Heart disease Father    Hyperlipidemia Father    Hypertension Father    Breast cancer Sister 13   Uterine cancer Maternal Grandmother        uterus    Social History   Socioeconomic History   Marital status: Widowed    Spouse name: Not on file   Number of children: 3   Years of education: Not on file   Highest education level: Not on file  Occupational History   Occupation: Retired  Tobacco Use   Smoking status: Former    Types: Cigarettes    Quit date: 06/21/1970    Years since quitting: 51.5   Smokeless tobacco: Never   Tobacco comments:    occasional smoker  Vaping Use   Vaping Use: Never used  Substance and Sexual Activity   Alcohol use: No   Drug use: No   Sexual activity: Never  Other Topics Concern   Not on file  Social History Narrative   Not on file   Social Determinants of Health   Financial Resource Strain: Low Risk  (05/25/2021)   Overall Financial Resource Strain (CARDIA)    Difficulty of Paying Living Expenses: Not hard at all  Food Insecurity: No Food  Insecurity (05/25/2021)   Hunger Vital Sign    Worried About Running Out of Food in the Last Year: Never true    [place] in the Last Year: Never true  Transportation Needs: No Transportation Needs (05/25/2021)   PRAPARE - Hydrologist (Medical): No    Lack of Transportation (Non-Medical): No  Physical Activity: Not on file  Stress: No Stress Concern Present (05/25/2021)   [place]    Feeling of Stress : Not at all  Social Connections: Moderately Integrated (05/25/2021)   Social Connection and Isolation Panel [NHANES]    Frequency of Communication with Friends and Family: More than three times a week    Frequency of Social Gatherings with Friends and Family: More than three times a week    Attends Religious Services: More than 4 times per year    Active Member of [employer] or Organizations: Yes    Attends Archivist Meetings: More than 4 times per year    Marital Status: Widowed  Intimate Partner Violence: Not  At Risk (05/25/2021)   Humiliation, Afraid, Rape, and Kick questionnaire    Fear of Current or Ex-Partner: No    Emotionally Abused: No    Physically Abused: No    Sexually Abused: No    Outpatient Medications Prior to Visit  Medication Sig Dispense Refill   atorvastatin (LIPITOR) 10 MG tablet TAKE 1 TABLET BY MOUTH  DAILY 90 tablet 1   dorzolamide (TRUSOPT) 2 % ophthalmic solution Place 1 drop into both eyes 2 (two) times daily.      fluticasone (FLONASE) 50 MCG/ACT nasal spray Place 2 sprays into both nostrils daily. 48 g 1   latanoprost (XALATAN) 0.005 % ophthalmic solution Place 1 drop into both eyes at bedtime.      levocetirizine (XYZAL) 5 MG tablet TAKE 1 TABLET BY MOUTH AT  BEDTIME AS NEEDED FOR ALLERGIES 90 tablet 1   metoprolol succinate (TOPROL-XL) 100 MG 24 hr tablet TAKE 1 TABLET BY MOUTH DAILY  WITH OR IMMEDIATELY FOLLOWING A  MEAL 90 tablet 1   timolol  (BETIMOL) 0.5 % ophthalmic solution Place 1 drop into both eyes 2 (two) times daily.     No facility-administered medications prior to visit.    Allergies  Allergen Reactions   Codeine Other (See Comments)    Makes feel funny    ROS     Objective:    Physical Exam  There were no vitals taken for this visit. Wt Readings from Last 3 Encounters:  09/16/21 196 lb (88.9 kg)  05/25/21 196 lb 3.2 oz (89 kg)  11/06/20 195 lb 12.8 oz (88.8 kg)    Diabetic Foot Exam - Simple   No data filed    Lab Results  Component Value Date   WBC 8.1 09/16/2021   HGB 14.9 09/16/2021   HCT 45.7 09/16/2021   PLT 273.0 09/16/2021   GLUCOSE 94 09/16/2021   CHOL 180 09/16/2021   TRIG 219.0 (H) 09/16/2021   HDL 61.20 09/16/2021   LDLDIRECT 87.0 09/16/2021   LDLCALC 68 12/26/2019   ALT 15 09/16/2021   AST 16 09/16/2021   NA 138 09/16/2021   K 5.1 09/16/2021   CL 102 09/16/2021   CREATININE 0.83 09/16/2021   BUN 15 09/16/2021   CO2 27 09/16/2021   TSH 2.26 11/06/2020   HGBA1C 6.0 09/16/2021    Lab Results  Component Value Date   TSH 2.26 11/06/2020   Lab Results  Component Value Date   WBC 8.1 09/16/2021   HGB 14.9 09/16/2021   HCT 45.7 09/16/2021   MCV 90.8 09/16/2021   PLT 273.0 09/16/2021   Lab Results  Component Value Date   NA 138 09/16/2021   K 5.1 09/16/2021   CO2 27 09/16/2021   GLUCOSE 94 09/16/2021   BUN 15 09/16/2021   CREATININE 0.83 09/16/2021   BILITOT 0.5 09/16/2021   ALKPHOS 92 09/16/2021   AST 16 09/16/2021   ALT 15 09/16/2021   PROT 6.4 09/16/2021   ALBUMIN 4.4 09/16/2021   CALCIUM 10.1 09/16/2021   GFR 66.50 09/16/2021   Lab Results  Component Value Date   CHOL 180 09/16/2021   Lab Results  Component Value Date   HDL 61.20 09/16/2021   Lab Results  Component Value Date   LDLCALC 68 12/26/2019   Lab Results  Component Value Date   TRIG 219.0 (H) 09/16/2021   Lab Results  Component Value Date   CHOLHDL 3 09/16/2021   Lab Results   Component Value Date   HGBA1C  6.0 09/16/2021       Assessment & Plan:      Problem List Items Addressed This Visit   None   I am having Jasmine Wilson maintain her latanoprost, dorzolamide, timolol, fluticasone, atorvastatin, metoprolol succinate, and levocetirizine.  No orders of the defined types were placed in this encounter.

## 2022-01-07 ENCOUNTER — Ambulatory Visit (INDEPENDENT_AMBULATORY_CARE_PROVIDER_SITE_OTHER): Payer: Medicare Other | Admitting: Family Medicine

## 2022-01-07 ENCOUNTER — Encounter: Payer: Self-pay | Admitting: Family Medicine

## 2022-01-07 VITALS — BP 130/70 | HR 85 | Resp 20 | Ht 68.0 in | Wt 187.0 lb

## 2022-01-07 DIAGNOSIS — I10 Essential (primary) hypertension: Secondary | ICD-10-CM

## 2022-01-07 DIAGNOSIS — I779 Disorder of arteries and arterioles, unspecified: Secondary | ICD-10-CM

## 2022-01-07 DIAGNOSIS — E782 Mixed hyperlipidemia: Secondary | ICD-10-CM

## 2022-01-07 DIAGNOSIS — I6522 Occlusion and stenosis of left carotid artery: Secondary | ICD-10-CM | POA: Diagnosis not present

## 2022-01-07 NOTE — Patient Instructions (Addendum)
Yerba matte tea do not drink  Consider Tetanus shot in the fall at pharmacy  RSV vaccine in fall at pharmacy  Shingrix is the new shingles shot, 2 shots over 2-6 months, confirm coverage with insurance and document, then can return here for shots with nurse appt or at pharmacy   Hypertension, Adult High blood pressure (hypertension) is when the force of blood pumping through the arteries is too strong. The arteries are the blood vessels that carry blood from the heart throughout the body. Hypertension forces the heart to work harder to pump blood and may cause arteries to become narrow or stiff. Untreated or uncontrolled hypertension can lead to a heart attack, heart failure, a stroke, kidney disease, and other problems. A blood pressure reading consists of a higher number over a lower number. Ideally, your blood pressure should be below 120/80. The first ("top") number is called the systolic pressure. It is a measure of the pressure in your arteries as your heart beats. The second ("bottom") number is called the diastolic pressure. It is a measure of the pressure in your arteries as the heart relaxes. What are the causes? The exact cause of this condition is not known. There are some conditions that result in high blood pressure. What increases the risk? Certain factors may make you more likely to develop high blood pressure. Some of these risk factors are under your control, including: Smoking. Not getting enough exercise or physical activity. Being overweight. Having too much fat, sugar, calories, or salt (sodium) in your diet. Drinking too much alcohol. Other risk factors include: Having a personal history of heart disease, diabetes, high cholesterol, or kidney disease. Stress. Having a family history of high blood pressure and high cholesterol. Having obstructive sleep apnea. Age. The risk increases with age. What are the signs or symptoms? High blood pressure may not cause symptoms.  Very high blood pressure (hypertensive crisis) may cause: Headache. Fast or irregular heartbeats (palpitations). Shortness of breath. Nosebleed. Nausea and vomiting. Vision changes. Severe chest pain, dizziness, and seizures. How is this diagnosed? This condition is diagnosed by measuring your blood pressure while you are seated, with your arm resting on a flat surface, your legs uncrossed, and your feet flat on the floor. The cuff of the blood pressure monitor will be placed directly against the skin of your upper arm at the level of your heart. Blood pressure should be measured at least twice using the same arm. Certain conditions can cause a difference in blood pressure between your right and left arms. If you have a high blood pressure reading during one visit or you have normal blood pressure with other risk factors, you may be asked to: Return on a different day to have your blood pressure checked again. Monitor your blood pressure at home for 1 week or longer. If you are diagnosed with hypertension, you may have other blood or imaging tests to help your health care provider understand your overall risk for other conditions. How is this treated? This condition is treated by making healthy lifestyle changes, such as eating healthy foods, exercising more, and reducing your alcohol intake. You may be referred for counseling on a healthy diet and physical activity. Your health care provider may prescribe medicine if lifestyle changes are not enough to get your blood pressure under control and if: Your systolic blood pressure is above 130. Your diastolic blood pressure is above 80. Your personal target blood pressure may vary depending on your medical conditions, your age,  and other factors. Follow these instructions at home: Eating and drinking  Eat a diet that is high in fiber and potassium, and low in sodium, added sugar, and fat. An example of this eating plan is called the DASH diet.  DASH stands for Dietary Approaches to Stop Hypertension. To eat this way: Eat plenty of fresh fruits and vegetables. Try to fill one half of your plate at each meal with fruits and vegetables. Eat whole grains, such as whole-wheat pasta, brown rice, or whole-grain bread. Fill about one fourth of your plate with whole grains. Eat or drink low-fat dairy products, such as skim milk or low-fat yogurt. Avoid fatty cuts of meat, processed or cured meats, and poultry with skin. Fill about one fourth of your plate with lean proteins, such as fish, chicken without skin, beans, eggs, or tofu. Avoid pre-made and processed foods. These tend to be higher in sodium, added sugar, and fat. Reduce your daily sodium intake. Many people with hypertension should eat less than 1,500 mg of sodium a day. Do not drink alcohol if: Your health care provider tells you not to drink. You are pregnant, may be pregnant, or are planning to become pregnant. If you drink alcohol: Limit how much you have to: 0-1 drink a day for women. 0-2 drinks a day for men. Know how much alcohol is in your drink. In the [place] one drink equals one 12 oz bottle of beer (355 mL), one 5 oz glass of wine (148 mL), or one 1 oz glass of hard liquor (44 mL). Lifestyle  Work with your health care provider to maintain a healthy body weight or to lose weight. Ask what an ideal weight is for you. Get at least 30 minutes of exercise that causes your heart to beat faster (aerobic exercise) most days of the week. Activities may include walking, swimming, or biking. Include exercise to strengthen your muscles (resistance exercise), such as Pilates or lifting weights, as part of your weekly exercise routine. Try to do these types of exercises for 30 minutes at least 3 days a week. Do not use any products that contain nicotine or tobacco. These products include cigarettes, chewing tobacco, and vaping devices, such as e-cigarettes. If you need help quitting, ask  your health care provider. Monitor your blood pressure at home as told by your health care provider. Keep all follow-up visits. This is important. Medicines Take over-the-counter and prescription medicines only as told by your health care provider. Follow directions carefully. Blood pressure medicines must be taken as prescribed. Do not skip doses of blood pressure medicine. Doing this puts you at risk for problems and can make the medicine less effective. Ask your health care provider about side effects or reactions to medicines that you should watch for. Contact a health care provider if you: Think you are having a reaction to a medicine you are taking. Have headaches that keep coming back (recurring). Feel dizzy. Have swelling in your ankles. Have trouble with your vision. Get help right away if you: Develop a severe headache or confusion. Have unusual weakness or numbness. Feel faint. Have severe pain in your chest or abdomen. Vomit repeatedly. Have trouble breathing. These symptoms may be an emergency. Get help right away. Call 911. Do not wait to see if the symptoms will go away. Do not drive yourself to the hospital. Summary Hypertension is when the force of blood pumping through your arteries is too strong. If this condition is not controlled, it may put  you at risk for serious complications. Your personal target blood pressure may vary depending on your medical conditions, your age, and other factors. For most people, a normal blood pressure is less than 120/80. Hypertension is treated with lifestyle changes, medicines, or a combination of both. Lifestyle changes include losing weight, eating a healthy, low-sodium diet, exercising more, and limiting alcohol. This information is not intended to replace advice given to you by your health care provider. Make sure you discuss any questions you have with your health care provider. Document Revised: 04/14/2021 Document Reviewed:  04/14/2021 Elsevier Patient Education  [place].

## 2022-01-07 NOTE — Assessment & Plan Note (Signed)
Encourage heart healthy diet such as MIND or DASH diet, increase exercise, avoid trans fats, simple carbohydrates and processed foods, consider a krill or fish or flaxseed oil cap daily.  °

## 2022-01-07 NOTE — Progress Notes (Signed)
Subjective:   By signing my name below, I, Jasmine Wilson, attest that this documentation has been prepared under the direction and in the presence of [name], MD 01/07/2022.   Patient ID: Jasmine Wilson, female    DOB: [birth year], 81 y.o.   MRN: [id number]  Chief Complaint  Patient presents with   Follow-up   HPI Patient is in today for an office visit.  Throat tickle: She reports having a tickle in her throat. She is requesting a refill on her Levocetirizine 5 mg.   MRI: She is inquiring about her MRI results. The results show: -Unremarkable for age brain MRI with no acute intracranial pathology. Background mild parenchymal volume loss and chronic white matter microangiopathy. -Beading of the high cervical internal carotid arteries consistent with fibromuscular dysplasia as seen on prior CTA neck from 2020. -Normal intracranial vasculature. No hemodynamically significant stenosis or aneurysm identified.   Headaches: She complains of having daily headaches each morning. She denies stiffness in the neck.  Polyuria: She reports that she is waking up each night every couple hours to use the restroom. She states that her bladder hurts, which is causing her to wake up. She denies snoring and rules out sleep apnea.  Immunizations: She has not received her shingles immunizations. She is UTD on her pneumonia immunizations. She has been informed about RSV and COVID-19 vaccination in the fall.  Hearing: She regularly wears her hearing aids.  Weight: She has lost 9 lb since her last visit. Wt Readings from Last 3 Encounters:  01/07/22 187 lb (84.8 kg)  09/16/21 196 lb (88.9 kg)  05/25/21 196 lb 3.2 oz (89 kg)   Lab Results  Component Value Date   CHOL 180 09/16/2021   CHOL 177 11/06/2020   CHOL 159 12/26/2019   Lab Results  Component Value Date   LDLCALC 68 12/26/2019   LDLCALC 81 03/27/2019   LDLCALC 66 10/03/2017   Lab Results  Component Value Date   TRIG 219.0 (H)  09/16/2021   TRIG 263.0 (H) 11/06/2020   TRIG 171.0 (H) 12/26/2019   Blood pressure: Her blood pressure is within normal range today. She is inquiring whether beets are effective at lowering blood pressure.  BP Readings from Last 3 Encounters:  01/07/22 130/70  09/16/21 (!) 154/69  05/25/21 (!) 147/75   Pulse Readings from Last 3 Encounters:  01/07/22 85  09/16/21 76  05/25/21 80   Mammogram: She does not show interest in further receiving mammograms.   Past Medical History:  Diagnosis Date   Abdominal pain 03/02/2011   Arthritis 03/02/2012   Bronchitis 04/04/2011   Cataract    Chicken pox as a child   Cough 01/29/2013   Female bladder prolapse, acquired 01/29/2011   Glaucoma 01/29/2011   Hearing aid worn    History of pneumonia    HTN (hypertension) 01/29/2011   Hyperlipidemia    Measles as a child   Meniere's disease    Pedal edema 04/01/2016   Preventative health care 01/29/2011   Shingles    Vaginosis 01/29/2013    Past Surgical History:  Procedure Laterality Date   BREAST BIOPSY Left    lump removed on left breast     clogged milk duct, benign   TUBAL LIGATION      Family History  Problem Relation Age of Onset   Breast cancer Mother    Stroke Father    Heart disease Father    Hyperlipidemia Father    Hypertension Father  Breast cancer Sister 71   Uterine cancer Maternal Grandmother        uterus    Social History   Socioeconomic History   Marital status: Widowed    Spouse name: Not on file   Number of children: 3   Years of education: Not on file   Highest education level: Not on file  Occupational History   Occupation: Retired  Tobacco Use   Smoking status: Former    Types: Cigarettes    Quit date: 06/21/1970    Years since quitting: 51.5   Smokeless tobacco: Never   Tobacco comments:    occasional smoker  Vaping Use   Vaping Use: Never used  Substance and Sexual Activity   Alcohol use: No   Drug use: No   Sexual activity: Never  Other  Topics Concern   Not on file  Social History Narrative   Not on file   Social Determinants of Health   Financial Resource Strain: Low Risk  (05/25/2021)   Overall Financial Resource Strain (CARDIA)    Difficulty of Paying Living Expenses: Not hard at all  Food Insecurity: No Food Insecurity (05/25/2021)   Hunger Vital Sign    Worried About Running Out of Food in the Last Year: Never true    [place] in the Last Year: Never true  Transportation Needs: No Transportation Needs (05/25/2021)   PRAPARE - Hydrologist (Medical): No    Lack of Transportation (Non-Medical): No  Physical Activity: Not on file  Stress: No Stress Concern Present (05/25/2021)   [place]    Feeling of Stress : Not at all  Social Connections: Moderately Integrated (05/25/2021)   Social Connection and Isolation Panel [NHANES]    Frequency of Communication with Friends and Family: More than three times a week    Frequency of Social Gatherings with Friends and Family: More than three times a week    Attends Religious Services: More than 4 times per year    Active Member of [employer] or Organizations: Yes    Attends Archivist Meetings: More than 4 times per year    Marital Status: Widowed  Intimate Partner Violence: Not At Risk (05/25/2021)   Humiliation, Afraid, Rape, and Kick questionnaire    Fear of Current or Ex-Partner: No    Emotionally Abused: No    Physically Abused: No    Sexually Abused: No   Outpatient Medications Prior to Visit  Medication Sig Dispense Refill   atorvastatin (LIPITOR) 10 MG tablet TAKE 1 TABLET BY MOUTH  DAILY 90 tablet 1   dorzolamide (TRUSOPT) 2 % ophthalmic solution Place 1 drop into both eyes 2 (two) times daily.      fluticasone (FLONASE) 50 MCG/ACT nasal spray Place 2 sprays into both nostrils daily. 48 g 1   latanoprost (XALATAN) 0.005 % ophthalmic solution Place 1 drop  into both eyes at bedtime.      levocetirizine (XYZAL) 5 MG tablet TAKE 1 TABLET BY MOUTH AT  BEDTIME AS NEEDED FOR ALLERGIES 90 tablet 1   metoprolol succinate (TOPROL-XL) 100 MG 24 hr tablet TAKE 1 TABLET BY MOUTH DAILY  WITH OR IMMEDIATELY FOLLOWING A  MEAL 90 tablet 1   timolol (BETIMOL) 0.5 % ophthalmic solution Place 1 drop into both eyes 2 (two) times daily.     No facility-administered medications prior to visit.    Allergies  Allergen Reactions   Codeine  Other (See Comments)    Makes feel funny    Review of Systems  Respiratory:         Throat tickle       Objective:    Physical Exam Constitutional:      General: She is not in acute distress.    Appearance: Normal appearance. She is not ill-appearing.  HENT:     Head: Normocephalic and atraumatic.     Right Ear: External ear normal.     Left Ear: External ear normal.  Eyes:     Extraocular Movements: Extraocular movements intact.     Pupils: Pupils are equal, round, and reactive to light.  Cardiovascular:     Rate and Rhythm: Normal rate and regular rhythm.     Pulses: Normal pulses.     Heart sounds: Normal heart sounds. No murmur heard.    No gallop.  Pulmonary:     Effort: Pulmonary effort is normal. No respiratory distress.     Breath sounds: Normal breath sounds. No wheezing or rales.  Abdominal:     General: Bowel sounds are normal.  Skin:    General: Skin is warm and dry.  Neurological:     Mental Status: She is alert and oriented to person, place, and time.  Psychiatric:        Mood and Affect: Mood normal.        Behavior: Behavior normal.        Judgment: Judgment normal.     BP 130/70 (BP Location: Left Arm, Patient Position: Sitting, Cuff Size: Normal)   Pulse 85   Resp 20   Ht '5\' 8"'$  (1.727 m)   Wt 187 lb (84.8 kg)   SpO2 97%   BMI 28.43 kg/m  Wt Readings from Last 3 Encounters:  01/07/22 187 lb (84.8 kg)  09/16/21 196 lb (88.9 kg)  05/25/21 196 lb 3.2 oz (89 kg)    Diabetic  Foot Exam - Simple   No data filed    Lab Results  Component Value Date   WBC 8.1 09/16/2021   HGB 14.9 09/16/2021   HCT 45.7 09/16/2021   PLT 273.0 09/16/2021   GLUCOSE 94 09/16/2021   CHOL 180 09/16/2021   TRIG 219.0 (H) 09/16/2021   HDL 61.20 09/16/2021   LDLDIRECT 87.0 09/16/2021   LDLCALC 68 12/26/2019   ALT 15 09/16/2021   AST 16 09/16/2021   NA 138 09/16/2021   K 5.1 09/16/2021   CL 102 09/16/2021   CREATININE 0.83 09/16/2021   BUN 15 09/16/2021   CO2 27 09/16/2021   TSH 2.26 11/06/2020   HGBA1C 6.0 09/16/2021    Lab Results  Component Value Date   TSH 2.26 11/06/2020   Lab Results  Component Value Date   WBC 8.1 09/16/2021   HGB 14.9 09/16/2021   HCT 45.7 09/16/2021   MCV 90.8 09/16/2021   PLT 273.0 09/16/2021   Lab Results  Component Value Date   NA 138 09/16/2021   K 5.1 09/16/2021   CO2 27 09/16/2021   GLUCOSE 94 09/16/2021   BUN 15 09/16/2021   CREATININE 0.83 09/16/2021   BILITOT 0.5 09/16/2021   ALKPHOS 92 09/16/2021   AST 16 09/16/2021   ALT 15 09/16/2021   PROT 6.4 09/16/2021   ALBUMIN 4.4 09/16/2021   CALCIUM 10.1 09/16/2021   GFR 66.50 09/16/2021   Lab Results  Component Value Date   CHOL 180 09/16/2021   Lab Results  Component Value Date   HDL  61.20 09/16/2021   Lab Results  Component Value Date   LDLCALC 68 12/26/2019   Lab Results  Component Value Date   TRIG 219.0 (H) 09/16/2021   Lab Results  Component Value Date   CHOLHDL 3 09/16/2021   Lab Results  Component Value Date   HGBA1C 6.0 09/16/2021      Assessment & Plan:   Problem List Items Addressed This Visit     Hyperlipidemia    Encourage heart healthy diet such as MIND or DASH diet, increase exercise, avoid trans fats, simple carbohydrates and processed foods, consider a krill or fish or flaxseed oil cap daily.       Relevant Orders   Lipid panel   HTN (hypertension) - Primary    Well controlled, no changes to meds. Encouraged heart healthy diet  such as the DASH diet and exercise as tolerated.       Relevant Orders   Comprehensive metabolic panel   CBC   TSH   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HCC)    Recent CT scan showed some possible abnormalities in both carotid arteries will proceed with Ultrasound for further evaluation      Relevant Orders   US Carotid Bilateral   RESOLVED: Stenosis of left carotid artery   No orders of the defined types were placed in this encounter.  I, Penni Homans, MD, personally preformed the services described in this documentation.  All medical record entries made by the scribe were at my direction and in my presence.  I have reviewed the chart and discharge instructions (if applicable) and agree that the record reflects my personal performance and is accurate and complete. 01/07/2022.  I,Mohammed Iqbal,acting as a scribe for Penni Homans, MD.,have documented all relevant documentation on the behalf of Penni Homans, MD,as directed by  Penni Homans, MD while in the presence of Penni Homans, MD.  Penni Homans, MD

## 2022-01-08 NOTE — Assessment & Plan Note (Deleted)
Recent CT scan showed some possible abnormalities in both carotid arteries will proceed with Ultrasound for further evaluation

## 2022-01-08 NOTE — Assessment & Plan Note (Signed)
Recent CT scan showed some possible abnormalities in both carotid arteries will proceed with Ultrasound for further evaluation

## 2022-01-08 NOTE — Assessment & Plan Note (Signed)
Well controlled, no changes to meds. Encouraged heart healthy diet such as the DASH diet and exercise as tolerated.  °

## 2022-01-12 ENCOUNTER — Ambulatory Visit (HOSPITAL_BASED_OUTPATIENT_CLINIC_OR_DEPARTMENT_OTHER)
Admission: RE | Admit: 2022-01-12 | Discharge: 2022-01-12 | Disposition: A | Discharge status: Home / Self Care | Payer: Medicare Other | Source: Ambulatory Visit | Attending: Family Medicine | Admitting: Family Medicine

## 2022-01-12 DIAGNOSIS — H539 Unspecified visual disturbance: Secondary | ICD-10-CM | POA: Diagnosis not present

## 2022-01-12 DIAGNOSIS — I779 Disorder of arteries and arterioles, unspecified: Secondary | ICD-10-CM | POA: Diagnosis not present

## 2022-01-12 DIAGNOSIS — E7800 Pure hypercholesterolemia, unspecified: Secondary | ICD-10-CM | POA: Diagnosis not present

## 2022-01-12 DIAGNOSIS — I6523 Occlusion and stenosis of bilateral carotid arteries: Secondary | ICD-10-CM | POA: Diagnosis not present

## 2022-01-12 DIAGNOSIS — I10 Essential (primary) hypertension: Secondary | ICD-10-CM | POA: Diagnosis not present

## 2022-01-19 ENCOUNTER — Ambulatory Visit: Payer: Medicare Other | Admitting: Family Medicine

## 2022-01-20 DIAGNOSIS — H401131 Primary open-angle glaucoma, bilateral, mild stage: Secondary | ICD-10-CM | POA: Diagnosis not present

## 2022-02-26 ENCOUNTER — Other Ambulatory Visit: Payer: Self-pay | Admitting: Family Medicine

## 2022-03-01 DIAGNOSIS — M25511 Pain in right shoulder: Secondary | ICD-10-CM | POA: Diagnosis not present

## 2022-03-02 DIAGNOSIS — M25511 Pain in right shoulder: Secondary | ICD-10-CM | POA: Diagnosis not present

## 2022-03-03 DIAGNOSIS — R29898 Other symptoms and signs involving the musculoskeletal system: Secondary | ICD-10-CM | POA: Diagnosis not present

## 2022-03-03 DIAGNOSIS — M25511 Pain in right shoulder: Secondary | ICD-10-CM | POA: Diagnosis not present

## 2022-03-03 DIAGNOSIS — M25611 Stiffness of right shoulder, not elsewhere classified: Secondary | ICD-10-CM | POA: Diagnosis not present

## 2022-03-03 DIAGNOSIS — Z789 Other specified health status: Secondary | ICD-10-CM | POA: Diagnosis not present

## 2022-03-03 DIAGNOSIS — Z7409 Other reduced mobility: Secondary | ICD-10-CM | POA: Diagnosis not present

## 2022-03-09 ENCOUNTER — Ambulatory Visit (INDEPENDENT_AMBULATORY_CARE_PROVIDER_SITE_OTHER): Payer: Medicare Other | Admitting: Family

## 2022-03-09 VITALS — BP 149/78 | HR 87 | Temp 98.0°F | Resp 16 | Wt 184.0 lb

## 2022-03-09 DIAGNOSIS — R300 Dysuria: Secondary | ICD-10-CM | POA: Diagnosis not present

## 2022-03-09 DIAGNOSIS — N3001 Acute cystitis with hematuria: Secondary | ICD-10-CM | POA: Diagnosis not present

## 2022-03-09 LAB — POC URINALSYSI DIPSTICK (AUTOMATED)
Glucose, UA: NEGATIVE
Ketones, UA: NEGATIVE
Nitrite, UA: NEGATIVE
Protein, UA: POSITIVE — AB
Spec Grav, UA: 1.025 (ref 1.010–1.025)
Urobilinogen, UA: 0.2 E.U./dL
pH, UA: 5 (ref 5.0–8.0)

## 2022-03-09 MED ORDER — CEPHALEXIN 500 MG PO CAPS: 500.0000 mg | ORAL_CAPSULE | Freq: Four times a day (QID) | ORAL | 0 refills | Status: DC

## 2022-03-09 NOTE — Progress Notes (Signed)
Subjective:   By signing my name below, I, Carylon Perches, attest that this documentation has been prepared under the direction and in the presence of [place], NP 03/09/2022   Patient ID: Jasmine Wilson, female    DOB: 1941/01/23, 81 y.o.   MRN: [id number]  Chief Complaint  Patient presents with   Dysuria    Patient complains of pain with urination    HPI Patient is in today for an office visit  Urinary Symptoms: She complains of urinary symptoms that appeared on 03/06/2022. She noticed that she was wobbly and warm. She did not feel well overall. On 03/08/2022, she noticed that it was painful to urinate. She took cranberry supplements but symptoms have not improved. She denies of any nasal drainage, headache, nausea, sore throat.    Health Maintenance Due  Topic Date Due   Zoster Vaccines- Shingrix (1 of 2) Never done   DEXA SCAN  Never done   COVID-19 Vaccine (4 - Pfizer risk series) 05/27/2020   TETANUS/TDAP  02/23/2022    Past Medical History:  Diagnosis Date   Abdominal pain 03/02/2011   Arthritis 03/02/2012   Bronchitis 04/04/2011   Cataract    Chicken pox as a child   Cough 01/29/2013   Female bladder prolapse, acquired 01/29/2011   Glaucoma 01/29/2011   Hearing aid worn    History of pneumonia    HTN (hypertension) 01/29/2011   Hyperlipidemia    Measles as a child   Meniere's disease    Pedal edema 04/01/2016   Preventative health care 01/29/2011   Shingles    Vaginosis 01/29/2013    Past Surgical History:  Procedure Laterality Date   BREAST BIOPSY Left    lump removed on left breast     clogged milk duct, benign   TUBAL LIGATION      Family History  Problem Relation Age of Onset   Breast cancer Mother    Stroke Father    Heart disease Father    Hyperlipidemia Father    Hypertension Father    Breast cancer Sister 62   Uterine cancer Maternal Grandmother        uterus    Social History   Socioeconomic History   Marital status: Widowed     Spouse name: Not on file   Number of children: 3   Years of education: Not on file   Highest education level: Not on file  Occupational History   Occupation: Retired  Tobacco Use   Smoking status: Former    Types: Cigarettes    Quit date: 06/21/1970    Years since quitting: 51.7   Smokeless tobacco: Never   Tobacco comments:    occasional smoker  Vaping Use   Vaping Use: Never used  Substance and Sexual Activity   Alcohol use: No   Drug use: No   Sexual activity: Never  Other Topics Concern   Not on file  Social History Narrative   Not on file   Social Determinants of Health   Financial Resource Strain: Low Risk  (05/25/2021)   Overall Financial Resource Strain (CARDIA)    Difficulty of Paying Living Expenses: Not hard at all  Food Insecurity: No Food Insecurity (05/25/2021)   Hunger Vital Sign    Worried About Running Out of Food in the Last Year: Never true    [place] in the Last Year: Never true  Transportation Needs: No Transportation Needs (05/25/2021)   [place] - Transportation  Lack of Transportation (Medical): No    Lack of Transportation (Non-Medical): No  Physical Activity: Not on file  Stress: No Stress Concern Present (05/25/2021)   [place]    Feeling of Stress : Not at all  Social Connections: Moderately Integrated (05/25/2021)   Social Connection and Isolation Panel [NHANES]    Frequency of Communication with Friends and Family: More than three times a week    Frequency of Social Gatherings with Friends and Family: More than three times a week    Attends Religious Services: More than 4 times per year    Active Member of [employer] or Organizations: Yes    Attends Archivist Meetings: More than 4 times per year    Marital Status: Widowed  Intimate Partner Violence: Not At Risk (05/25/2021)   Humiliation, Afraid, Rape, and Kick questionnaire    Fear of Current or Ex-Partner:  No    Emotionally Abused: No    Physically Abused: No    Sexually Abused: No    Outpatient Medications Prior to Visit  Medication Sig Dispense Refill   atorvastatin (LIPITOR) 10 MG tablet Take 1 tablet (10 mg total) by mouth daily. 90 tablet 1   dorzolamide (TRUSOPT) 2 % ophthalmic solution Place 1 drop into both eyes 2 (two) times daily.      fluticasone (FLONASE) 50 MCG/ACT nasal spray Place 2 sprays into both nostrils daily. 48 g 1   latanoprost (XALATAN) 0.005 % ophthalmic solution Place 1 drop into both eyes at bedtime.      levocetirizine (XYZAL) 5 MG tablet TAKE 1 TABLET BY MOUTH AT  BEDTIME AS NEEDED FOR ALLERGIES 90 tablet 1   metoprolol succinate (TOPROL-XL) 100 MG 24 hr tablet TAKE 1 TABLET BY MOUTH DAILY  WITH OR IMMEDIATELY FOLLOWING A  MEAL 90 tablet 1   timolol (BETIMOL) 0.5 % ophthalmic solution Place 1 drop into both eyes 2 (two) times daily.     No facility-administered medications prior to visit.    Allergies  Allergen Reactions   Codeine Other (See Comments)    Makes feel funny    Review of Systems  HENT:  Negative for sore throat.        (-) Nasal Drainage  Gastrointestinal:  Negative for nausea.  Genitourinary:  Positive for dysuria.  Neurological:  Negative for headaches.       Objective:    Physical Exam Constitutional:      General: She is not in acute distress.    Appearance: Normal appearance. She is not ill-appearing.  HENT:     Head: Normocephalic and atraumatic.     Right Ear: External ear normal.     Left Ear: External ear normal.  Eyes:     Extraocular Movements: Extraocular movements intact.     Pupils: Pupils are equal, round, and reactive to light.  Cardiovascular:     Rate and Rhythm: Normal rate and regular rhythm.     Heart sounds: Normal heart sounds. No murmur heard.    No gallop.  Pulmonary:     Effort: Pulmonary effort is normal. No respiratory distress.     Breath sounds: Normal breath sounds. No wheezing or rales.   Skin:    General: Skin is warm and dry.  Neurological:     Mental Status: She is alert and oriented to person, place, and time.  Psychiatric:        Mood and Affect: Mood normal.  Behavior: Behavior normal.        Judgment: Judgment normal.     BP (!) 149/78 (BP Location: Right Arm, Patient Position: Sitting, Cuff Size: Small)   Pulse 87   Temp 98 F (36.7 C) (Oral)   Resp 16   Wt 184 lb (83.5 kg)   SpO2 98%   BMI 27.98 kg/m  Wt Readings from Last 3 Encounters:  03/09/22 184 lb (83.5 kg)  01/07/22 187 lb (84.8 kg)  09/16/21 196 lb (88.9 kg)       Assessment & Plan:   Problem List Items Addressed This Visit       Unprioritized   Acute cystitis with hematuria    New. UA is suggestive of UTI. Will send urine for culture and begin Keflex. Pt is advised to call if symptoms worsen or if symptoms do not improve. Pt verbalizes understanding.       Other Visit Diagnoses     Dysuria    -  Primary   Relevant Orders   POCT Urinalysis Dipstick (Automated)      Meds ordered this encounter  Medications   cephALEXin (KEFLEX) 500 MG capsule    Sig: Take 1 capsule (500 mg total) by mouth 4 (four) times daily.    Dispense:  28 capsule    Refill:  0    Order Specific Question:   Supervising Provider    Answer:   Penni Homans A [[id number]]    I, Nance Pear, NP, personally preformed the services described in this documentation.  All medical record entries made by the scribe were at my direction and in my presence.  I have reviewed the chart and discharge instructions (if applicable) and agree that the record reflects my personal performance and is accurate and complete. 03/09/2022   I,Amber Collins,acting as a Education administrator for Nance Pear, NP.,have documented all relevant documentation on the behalf of Nance Pear, NP,as directed by  Nance Pear, NP while in the presence of Nance Pear, NP.    Nance Pear, NP

## 2022-03-09 NOTE — Patient Instructions (Signed)
Begin Cephalexin for urinary tract infection. Call if symptoms worsen or if symptoms are not improved in 2-3 days.

## 2022-03-09 NOTE — Assessment & Plan Note (Signed)
New. UA is suggestive of UTI. Will send urine for culture and begin Keflex. Pt is advised to call if symptoms worsen or if symptoms do not improve. Pt verbalizes understanding.

## 2022-03-12 ENCOUNTER — Other Ambulatory Visit: Payer: Self-pay | Admitting: Family Medicine

## 2022-03-12 LAB — URINE CULTURE
MICRO NUMBER:: 13937714
SPECIMEN QUALITY:: ADEQUATE

## 2022-03-17 DIAGNOSIS — M25611 Stiffness of right shoulder, not elsewhere classified: Secondary | ICD-10-CM | POA: Diagnosis not present

## 2022-03-17 DIAGNOSIS — R29898 Other symptoms and signs involving the musculoskeletal system: Secondary | ICD-10-CM | POA: Diagnosis not present

## 2022-03-17 DIAGNOSIS — M25511 Pain in right shoulder: Secondary | ICD-10-CM | POA: Diagnosis not present

## 2022-03-17 DIAGNOSIS — Z789 Other specified health status: Secondary | ICD-10-CM | POA: Diagnosis not present

## 2022-03-17 DIAGNOSIS — Z7409 Other reduced mobility: Secondary | ICD-10-CM | POA: Diagnosis not present

## 2022-03-22 DIAGNOSIS — M25611 Stiffness of right shoulder, not elsewhere classified: Secondary | ICD-10-CM | POA: Diagnosis not present

## 2022-03-22 DIAGNOSIS — Z7409 Other reduced mobility: Secondary | ICD-10-CM | POA: Diagnosis not present

## 2022-03-22 DIAGNOSIS — R29898 Other symptoms and signs involving the musculoskeletal system: Secondary | ICD-10-CM | POA: Diagnosis not present

## 2022-03-22 DIAGNOSIS — M25511 Pain in right shoulder: Secondary | ICD-10-CM | POA: Diagnosis not present

## 2022-03-22 DIAGNOSIS — Z789 Other specified health status: Secondary | ICD-10-CM | POA: Diagnosis not present

## 2022-03-29 DIAGNOSIS — M7541 Impingement syndrome of right shoulder: Secondary | ICD-10-CM | POA: Diagnosis not present

## 2022-03-29 DIAGNOSIS — M7581 Other shoulder lesions, right shoulder: Secondary | ICD-10-CM | POA: Diagnosis not present

## 2022-04-01 DIAGNOSIS — Z789 Other specified health status: Secondary | ICD-10-CM | POA: Diagnosis not present

## 2022-04-01 DIAGNOSIS — R29898 Other symptoms and signs involving the musculoskeletal system: Secondary | ICD-10-CM | POA: Diagnosis not present

## 2022-04-01 DIAGNOSIS — Z7409 Other reduced mobility: Secondary | ICD-10-CM | POA: Diagnosis not present

## 2022-04-01 DIAGNOSIS — M25611 Stiffness of right shoulder, not elsewhere classified: Secondary | ICD-10-CM | POA: Diagnosis not present

## 2022-04-01 DIAGNOSIS — M25511 Pain in right shoulder: Secondary | ICD-10-CM | POA: Diagnosis not present

## 2022-04-26 DIAGNOSIS — Z7409 Other reduced mobility: Secondary | ICD-10-CM | POA: Diagnosis not present

## 2022-04-26 DIAGNOSIS — R29898 Other symptoms and signs involving the musculoskeletal system: Secondary | ICD-10-CM | POA: Diagnosis not present

## 2022-04-26 DIAGNOSIS — M25511 Pain in right shoulder: Secondary | ICD-10-CM | POA: Diagnosis not present

## 2022-04-26 DIAGNOSIS — M25611 Stiffness of right shoulder, not elsewhere classified: Secondary | ICD-10-CM | POA: Diagnosis not present

## 2022-04-26 DIAGNOSIS — Z789 Other specified health status: Secondary | ICD-10-CM | POA: Diagnosis not present

## 2022-05-03 DIAGNOSIS — M25511 Pain in right shoulder: Secondary | ICD-10-CM | POA: Diagnosis not present

## 2022-05-03 DIAGNOSIS — R29898 Other symptoms and signs involving the musculoskeletal system: Secondary | ICD-10-CM | POA: Diagnosis not present

## 2022-05-03 DIAGNOSIS — Z789 Other specified health status: Secondary | ICD-10-CM | POA: Diagnosis not present

## 2022-05-03 DIAGNOSIS — M25611 Stiffness of right shoulder, not elsewhere classified: Secondary | ICD-10-CM | POA: Diagnosis not present

## 2022-05-03 DIAGNOSIS — Z7409 Other reduced mobility: Secondary | ICD-10-CM | POA: Diagnosis not present

## 2022-05-10 DIAGNOSIS — Z7409 Other reduced mobility: Secondary | ICD-10-CM | POA: Diagnosis not present

## 2022-05-10 DIAGNOSIS — M25511 Pain in right shoulder: Secondary | ICD-10-CM | POA: Diagnosis not present

## 2022-05-10 DIAGNOSIS — Z789 Other specified health status: Secondary | ICD-10-CM | POA: Diagnosis not present

## 2022-05-10 DIAGNOSIS — M25611 Stiffness of right shoulder, not elsewhere classified: Secondary | ICD-10-CM | POA: Diagnosis not present

## 2022-05-10 DIAGNOSIS — R29898 Other symptoms and signs involving the musculoskeletal system: Secondary | ICD-10-CM | POA: Diagnosis not present

## 2022-05-17 DIAGNOSIS — M7581 Other shoulder lesions, right shoulder: Secondary | ICD-10-CM | POA: Diagnosis not present

## 2022-05-17 DIAGNOSIS — M19011 Primary osteoarthritis, right shoulder: Secondary | ICD-10-CM | POA: Diagnosis not present

## 2022-05-17 DIAGNOSIS — M75111 Incomplete rotator cuff tear or rupture of right shoulder, not specified as traumatic: Secondary | ICD-10-CM | POA: Diagnosis not present

## 2022-05-17 DIAGNOSIS — M7521 Bicipital tendinitis, right shoulder: Secondary | ICD-10-CM | POA: Diagnosis not present

## 2022-05-17 DIAGNOSIS — M25511 Pain in right shoulder: Secondary | ICD-10-CM | POA: Diagnosis not present

## 2022-05-17 DIAGNOSIS — M7541 Impingement syndrome of right shoulder: Secondary | ICD-10-CM | POA: Diagnosis not present

## 2022-05-18 DIAGNOSIS — H401131 Primary open-angle glaucoma, bilateral, mild stage: Secondary | ICD-10-CM | POA: Diagnosis not present

## 2022-05-20 DIAGNOSIS — Z7409 Other reduced mobility: Secondary | ICD-10-CM | POA: Diagnosis not present

## 2022-05-20 DIAGNOSIS — M25511 Pain in right shoulder: Secondary | ICD-10-CM | POA: Diagnosis not present

## 2022-05-20 DIAGNOSIS — R29898 Other symptoms and signs involving the musculoskeletal system: Secondary | ICD-10-CM | POA: Diagnosis not present

## 2022-05-20 DIAGNOSIS — Z789 Other specified health status: Secondary | ICD-10-CM | POA: Diagnosis not present

## 2022-05-20 DIAGNOSIS — M25611 Stiffness of right shoulder, not elsewhere classified: Secondary | ICD-10-CM | POA: Diagnosis not present

## 2022-05-26 ENCOUNTER — Ambulatory Visit (INDEPENDENT_AMBULATORY_CARE_PROVIDER_SITE_OTHER): Payer: Medicare Other

## 2022-05-26 VITALS — Wt 180.0 lb

## 2022-05-26 DIAGNOSIS — Z0000 Encounter for general adult medical examination without abnormal findings: Secondary | ICD-10-CM | POA: Diagnosis not present

## 2022-05-26 NOTE — Patient Instructions (Signed)
Jasmine Wilson , Thank you for taking time to come for your Medicare Wellness Visit. I appreciate your ongoing commitment to your health goals. Please review the following plan we discussed and let me know if I can assist you in the future.   These are the goals we discussed:  Goals       Improve balance with new exercise (pt-stated)      Patient Stated      Increase exercise      Patient Stated      Continue to exercise with silver sneakers         This is a list of the screening recommended for you and due dates:  Health Maintenance  Topic Date Due   Zoster (Shingles) Vaccine (1 of 2) Never done   COVID-19 Vaccine (4 - 2023-24 season) 02/19/2022   DTaP/Tdap/Td vaccine (2 - Td or Tdap) 02/23/2022   DEXA scan (bone density measurement)  05/27/2023*   Medicare Annual Wellness Visit  05/27/2023   Pneumonia Vaccine  Completed   HPV Vaccine  Aged Out   Flu Shot  Discontinued  *Topic was postponed. The date shown is not the original due date.    Advanced directives: Please bring a copy of your health care power of attorney and living will to the office at your convenience.  Conditions/risks identified: continue to follow up with silver sneakers    Next appointment: Follow up in one year for your annual wellness visit   Preventive Care 65 Years and Older, Female Preventive care refers to lifestyle choices and visits with your health care provider that can promote health and wellness. What does preventive care include? A yearly physical exam. This is also called an annual well check. Dental exams once or twice a year. Routine eye exams. Ask your health care provider how often you should have your eyes checked. Personal lifestyle choices, including: Daily care of your teeth and gums. Regular physical activity. Eating a healthy diet. Avoiding tobacco and drug use. Limiting alcohol use. Practicing safe sex. Taking low-dose aspirin every day. Taking vitamin and mineral supplements  as recommended by your health care provider. What happens during an annual well check? The services and screenings done by your health care provider during your annual well check will depend on your age, overall health, lifestyle risk factors, and family history of disease. Counseling  Your health care provider may ask you questions about your: Alcohol use. Tobacco use. Drug use. Emotional well-being. Home and relationship well-being. Sexual activity. Eating habits. History of falls. Memory and ability to understand (cognition). Work and work Statistician. Reproductive health. Screening  You may have the following tests or measurements: Height, weight, and BMI. Blood pressure. Lipid and cholesterol levels. These may be checked every 5 years, or more frequently if you are over 30 years old. Skin check. Lung cancer screening. You may have this screening every year starting at age 11 if you have a 30-pack-year history of smoking and currently smoke or have quit within the past 15 years. Fecal occult blood test (FOBT) of the stool. You may have this test every year starting at age 97. Flexible sigmoidoscopy or colonoscopy. You may have a sigmoidoscopy every 5 years or a colonoscopy every 10 years starting at age 51. Hepatitis C blood test. Hepatitis B blood test. Sexually transmitted disease (STD) testing. Diabetes screening. This is done by checking your blood sugar (glucose) after you have not eaten for a while (fasting). You may have this done every  1-3 years. Bone density scan. This is done to screen for osteoporosis. You may have this done starting at age 42. Mammogram. This may be done every 1-2 years. Talk to your health care provider about how often you should have regular mammograms. Talk with your health care provider about your test results, treatment options, and if necessary, the need for more tests. Vaccines  Your health care provider may recommend certain vaccines, such  as: Influenza vaccine. This is recommended every year. Tetanus, diphtheria, and acellular pertussis (Tdap, Td) vaccine. You may need a Td booster every 10 years. Zoster vaccine. You may need this after age 40. Pneumococcal 13-valent conjugate (PCV13) vaccine. One dose is recommended after age 68. Pneumococcal polysaccharide (PPSV23) vaccine. One dose is recommended after age 13. Talk to your health care provider about which screenings and vaccines you need and how often you need them. This information is not intended to replace advice given to you by your health care provider. Make sure you discuss any questions you have with your health care provider. Document Released: 07/04/2015 Document Revised: 02/25/2016 Document Reviewed: 04/08/2015 Elsevier Interactive Patient Education  2017 [employer] Prevention in the Home Falls can cause injuries. They can happen to people of all ages. There are many things you can do to make your home safe and to help prevent falls. What can I do on the outside of my home? Regularly fix the edges of walkways and driveways and fix any cracks. Remove anything that might make you trip as you walk through a door, such as a raised step or threshold. Trim any bushes or trees on the path to your home. Use bright outdoor lighting. Clear any walking paths of anything that might make someone trip, such as rocks or tools. Regularly check to see if handrails are loose or broken. Make sure that both sides of any steps have handrails. Any raised decks and porches should have guardrails on the edges. Have any leaves, snow, or ice cleared regularly. Use sand or salt on walking paths during winter. Clean up any spills in your garage right away. This includes oil or grease spills. What can I do in the bathroom? Use night lights. Install grab bars by the toilet and in the tub and shower. Do not use towel bars as grab bars. Use non-skid mats or decals in the tub or  shower. If you need to sit down in the shower, use a plastic, non-slip stool. Keep the floor dry. Clean up any water that spills on the floor as soon as it happens. Remove soap buildup in the tub or shower regularly. Attach bath mats securely with double-sided non-slip rug tape. Do not have throw rugs and other things on the floor that can make you trip. What can I do in the bedroom? Use night lights. Make sure that you have a light by your bed that is easy to reach. Do not use any sheets or blankets that are too big for your bed. They should not hang down onto the floor. Have a firm chair that has side arms. You can use this for support while you get dressed. Do not have throw rugs and other things on the floor that can make you trip. What can I do in the kitchen? Clean up any spills right away. Avoid walking on wet floors. Keep items that you use a lot in easy-to-reach places. If you need to reach something above you, use a strong step stool that has a  grab bar. Keep electrical cords out of the way. Do not use floor polish or wax that makes floors slippery. If you must use wax, use non-skid floor wax. Do not have throw rugs and other things on the floor that can make you trip. What can I do with my stairs? Do not leave any items on the stairs. Make sure that there are handrails on both sides of the stairs and use them. Fix handrails that are broken or loose. Make sure that handrails are as long as the stairways. Check any carpeting to make sure that it is firmly attached to the stairs. Fix any carpet that is loose or worn. Avoid having throw rugs at the top or bottom of the stairs. If you do have throw rugs, attach them to the floor with carpet tape. Make sure that you have a light switch at the top of the stairs and the bottom of the stairs. If you do not have them, ask someone to add them for you. What else can I do to help prevent falls? Wear shoes that: Do not have high heels. Have  rubber bottoms. Are comfortable and fit you well. Are closed at the toe. Do not wear sandals. If you use a stepladder: Make sure that it is fully opened. Do not climb a closed stepladder. Make sure that both sides of the stepladder are locked into place. Ask someone to hold it for you, if possible. Clearly mark and make sure that you can see: Any grab bars or handrails. First and last steps. Where the edge of each step is. Use tools that help you move around (mobility aids) if they are needed. These include: Canes. Walkers. Scooters. Crutches. Turn on the lights when you go into a dark area. Replace any light bulbs as soon as they burn out. Set up your furniture so you have a clear path. Avoid moving your furniture around. If any of your floors are uneven, fix them. If there are any pets around you, be aware of where they are. Review your medicines with your doctor. Some medicines can make you feel dizzy. This can increase your chance of falling. Ask your doctor what other things that you can do to help prevent falls. This information is not intended to replace advice given to you by your health care provider. Make sure you discuss any questions you have with your health care provider. Document Released: 04/03/2009 Document Revised: 11/13/2015 Document Reviewed: 07/12/2014 Elsevier Interactive Patient Education  2017 [employer].

## 2022-05-26 NOTE — Progress Notes (Addendum)
I connected with  Jasmine Wilson on 05/26/22 by a audio enabled telemedicine application and verified that I am speaking with the correct person using two identifiers.  Patient Location: Home  Provider Location: Home Office  I discussed the limitations of evaluation and management by telemedicine. The patient expressed understanding and agreed to proceed.   Subjective:   Jasmine Wilson is a 81 y.o. female who presents for Medicare Annual (Subsequent) preventive examination.  Review of Systems     Cardiac Risk Factors include: advanced age (>25mn, >>88women);hypertension     Objective:    Today's Vitals   05/26/22 1404  Weight: 180 lb (81.6 kg)   Body mass index is 27.37 kg/m.     05/26/2022    2:10 PM 05/25/2021    1:12 PM 02/17/2018   11:14 AM 01/04/2017    1:34 PM 10/01/2014    3:24 PM  Advanced Directives  Does Patient Have a Medical Advance Directive? Yes No No No No  Type of AParamedicof AFowlerLiving will      Copy of HFish Hawkin Chart? No - copy requested      Would patient like information on creating a medical advance directive?  No - Patient declined Yes (MAU/Ambulatory/Procedural Areas - Information given) No - Patient declined Yes - Educational materials given    Current Medications (verified) Outpatient Encounter Medications as of 05/26/2022  Medication Sig   atorvastatin (LIPITOR) 10 MG tablet Take 1 tablet (10 mg total) by mouth daily.   brimonidine (ALPHAGAN) 0.2 % ophthalmic solution SMARTSIG:1 Drop(s) In Eye(s) Every 12 Hours   cephALEXin (KEFLEX) 500 MG capsule Take 1 capsule (500 mg total) by mouth 4 (four) times daily.   dorzolamide (TRUSOPT) 2 % ophthalmic solution Place 1 drop into both eyes 2 (two) times daily.    fluticasone (FLONASE) 50 MCG/ACT nasal spray Place 2 sprays into both nostrils daily.   latanoprost (XALATAN) 0.005 % ophthalmic solution Place 1 drop into both eyes at bedtime.    levocetirizine  (XYZAL) 5 MG tablet TAKE 1 TABLET BY MOUTH AT  BEDTIME AS NEEDED FOR ALLERGIES   metoprolol succinate (TOPROL-XL) 100 MG 24 hr tablet Take 1 tablet (100 mg total) by mouth daily. Take with or immediately following a meal   timolol (BETIMOL) 0.5 % ophthalmic solution Place 1 drop into both eyes 2 (two) times daily.   No facility-administered encounter medications on file as of 05/26/2022.    Allergies (verified) Codeine   History: Past Medical History:  Diagnosis Date   Abdominal pain 03/02/2011   Arthritis 03/02/2012   Bronchitis 04/04/2011   Cataract    Chicken pox as a child   Cough 01/29/2013   Female bladder prolapse, acquired 01/29/2011   Glaucoma 01/29/2011   Hearing aid worn    History of pneumonia    HTN (hypertension) 01/29/2011   Hyperlipidemia    Measles as a child   Meniere's disease    Pedal edema 04/01/2016   Preventative health care 01/29/2011   Shingles    Vaginosis 01/29/2013   Past Surgical History:  Procedure Laterality Date   BREAST BIOPSY Left    lump removed on left breast     clogged milk duct, benign   TUBAL LIGATION     Family History  Problem Relation Age of Onset   Breast cancer Mother    Stroke Father    Heart disease Father    Hyperlipidemia Father    Hypertension  Father    Breast cancer Sister 3   Uterine cancer Maternal Grandmother        uterus   Social History   Socioeconomic History   Marital status: Widowed    Spouse name: Not on file   Number of children: 3   Years of education: Not on file   Highest education level: Not on file  Occupational History   Occupation: Retired  Tobacco Use   Smoking status: Former    Types: Cigarettes    Quit date: 06/21/1970    Years since quitting: 51.9   Smokeless tobacco: Never   Tobacco comments:    occasional smoker  Vaping Use   Vaping Use: Never used  Substance and Sexual Activity   Alcohol use: No   Drug use: No   Sexual activity: Never  Other Topics Concern   Not on file   Social History Narrative   Not on file   Social Determinants of Health   Financial Resource Strain: Low Risk  (05/26/2022)   Overall Financial Resource Strain (CARDIA)    Difficulty of Paying Living Expenses: Not hard at all  Food Insecurity: No Food Insecurity (05/26/2022)   Hunger Vital Sign    Worried About Running Out of Food in the Last Year: Never true    [place] in the Last Year: Never true  Transportation Needs: No Transportation Needs (05/26/2022)   PRAPARE - Hydrologist (Medical): No    Lack of Transportation (Non-Medical): No  Physical Activity: Insufficiently Active (05/26/2022)   Exercise Vital Sign    Days of Exercise per Week: 2 days    Minutes of Exercise per Session: 60 min  Stress: No Stress Concern Present (05/26/2022)   [place]    Feeling of Stress : Not at all  Social Connections: Moderately Integrated (05/26/2022)   Social Connection and Isolation Panel [NHANES]    Frequency of Communication with Friends and Family: More than three times a week    Frequency of Social Gatherings with Friends and Family: More than three times a week    Attends Religious Services: More than 4 times per year    Active Member of [employer] or Organizations: Yes    Attends Archivist Meetings: 1 to 4 times per year    Marital Status: Widowed    Tobacco Counseling Counseling given: Not Answered Tobacco comments: occasional smoker   Clinical Intake:  Pre-visit preparation completed: Yes  Pain : No/denies pain     BMI - recorded: 27.37 Nutritional Status: BMI 25 -29 Overweight Nutritional Risks: None Diabetes: No  How often do you need to have someone help you when you read instructions, pamphlets, or other written materials from your doctor or pharmacy?: 1 - Never  Diabetic?no  Interpreter Needed?: No  Information entered by :: Charlott Rakes,  LPN   Activities of Daily Living    05/26/2022    2:11 PM  In your present state of health, do you have any difficulty performing the following activities:  Hearing? 1  Comment wears hearing aids  Vision? 0  Difficulty concentrating or making decisions? 0  Walking or climbing stairs? 0  Dressing or bathing? 0  Doing errands, shopping? 0  Preparing Food and eating ? N  Using the Toilet? N  In the past six months, have you accidently leaked urine? Y  Comment wears a pad  Do you have problems with loss  of bowel control? N  Managing your Medications? N  Managing your Finances? N  Housekeeping or managing your Housekeeping? N    Patient Care Team: [name], MD as PCP - General (Family Medicine) Macarthur Critchley, [place] as Referring Physician (Optometry)  Indicate any recent Medical Services you may have received from other than [facility] providers in the past year (date may be approximate).     Assessment:   This is a routine wellness examination for [facility].  Hearing/Vision screen Hearing Screening - Comments:: Pt wears hearing aids  Vision Screening - Comments:: Pt follows with Dr Macarthur Critchley for annual eye exams   Dietary issues and exercise activities discussed: Current Exercise Habits: Home exercise routine;Structured exercise class, Type of exercise: Other - see comments, Time (Minutes): 60, Frequency (Times/Week): 2, Weekly Exercise (Minutes/Week): 120   Goals Addressed             This Visit's Progress    Patient Stated       Continue to exercise with silver sneakers        Depression Screen    05/26/2022    2:09 PM 01/07/2022    1:44 PM 05/25/2021    1:16 PM 04/01/2020    2:07 PM 02/17/2018   11:15 AM 04/04/2017   11:18 AM 01/04/2017    1:35 PM  PHQ 2/9 Scores  PHQ - 2 Score 0 0 0 0 0 0 0    Fall Risk    05/26/2022    2:11 PM 01/07/2022    1:43 PM 05/25/2021    1:13 PM 04/01/2020    2:08 PM 02/17/2018   11:15 AM  [place] in the past year? 0 0 1 0 No   Number falls in past yr: 0 0 0 0   Injury with Fall? 0 0 0 0   Risk for fall due to : Impaired vision;Impaired balance/gait No Fall Risks History of fall(s)    Follow up Falls prevention discussed Falls evaluation completed Falls prevention discussed      FALL RISK PREVENTION PERTAINING TO THE HOME:  Any stairs in or around the home? No  If so, are there any without handrails? No  Home free of loose throw rugs in walkways, pet beds, electrical cords, etc? Yes  Adequate lighting in your home to reduce risk of falls? Yes   ASSISTIVE DEVICES UTILIZED TO PREVENT FALLS:  Life alert? Yes  Use of a cane, walker or w/c? No  Grab bars in the bathroom? Yes  Shower chair or bench in shower? Yes  Elevated toilet seat or a handicapped toilet? Yes   TIMED UP AND GO:  Was the test performed? No .   Cognitive Function: Pt declined    01/04/2017    1:35 PM  MMSE - Mini Mental State Exam  Orientation to time 5  Orientation to Place 5  Registration 3  Attention/ Calculation 5  Recall 2  Language- name 2 objects 2  Language- repeat 1  Language- follow 3 step command 3  Language- read & follow direction 1  Write a sentence 1  Copy design 1  Total score 29        Immunizations Immunization History  Administered Date(s) Administered   PFIZER(Purple Top)SARS-COV-2 Vaccination 07/27/2019, 08/17/2019, 04/01/2020   Pneumococcal Conjugate-13 05/06/2015   Pneumococcal Polysaccharide-23 04/03/2018   Tdap 02/24/2012    TDAP status: Due, Education has been provided regarding the importance of this vaccine. Advised may receive this vaccine at  local pharmacy or Health Dept. Aware to provide a copy of the vaccination record if obtained from local pharmacy or Health Dept. Verbalized acceptance and understanding.  Flu Vaccine status: Declined, Education has been provided regarding the importance of this vaccine but patient still declined. Advised may receive this vaccine at local pharmacy or  Health Dept. Aware to provide a copy of the vaccination record if obtained from local pharmacy or Health Dept. Verbalized acceptance and understanding.  Pneumococcal vaccine status: Up to date  Covid-19 vaccine status: Completed vaccines  Qualifies for Shingles Vaccine? Yes   Zostavax completed No   Shingrix Completed?: No.    Education has been provided regarding the importance of this vaccine. Patient has been advised to call insurance company to determine out of pocket expense if they have not yet received this vaccine. Advised may also receive vaccine at local pharmacy or Health Dept. Verbalized acceptance and understanding.  Screening Tests Health Maintenance  Topic Date Due   Zoster Vaccines- Shingrix (1 of 2) Never done   COVID-19 Vaccine (4 - 2023-24 season) 02/19/2022   DTaP/Tdap/Td (2 - Td or Tdap) 02/23/2022   DEXA SCAN  05/27/2023 (Originally 02/16/2006)   Medicare Annual Wellness (AWV)  05/27/2023   Pneumonia Vaccine 70+ Years old  Completed   HPV VACCINES  Aged Out   INFLUENZA VACCINE  Discontinued    Health Maintenance  Health Maintenance Due  Topic Date Due   Zoster Vaccines- Shingrix (1 of 2) Never done   COVID-19 Vaccine (4 - 2023-24 season) 02/19/2022   DTaP/Tdap/Td (2 - Td or Tdap) 02/23/2022    Colorectal cancer screening: No longer required.   Mammogram status: No longer required due to per pt.   Additional Screening:   Vision Screening: Recommended annual ophthalmology exams for early detection of glaucoma and other disorders of the eye. Is the patient up to date with their annual eye exam?  Yes  Who is the provider or what is the name of the office in which the patient attends annual eye exams? Dr Macarthur Critchley  If pt is not established with a provider, would they like to be referred to a provider to establish care? No .   Dental Screening: Recommended annual dental exams for proper oral hygiene  Community Resource Referral / Chronic Care  Management: CRR required this visit?  No   CCM required this visit?  No      Plan:     I have personally reviewed and noted the following in the patient's chart:   Medical and social history Use of alcohol, tobacco or illicit drugs  Current medications and supplements including opioid prescriptions. Patient is not currently taking opioid prescriptions. Functional ability and status Nutritional status Physical activity Advanced directives List of other physicians Hospitalizations, surgeries, and ER visits in previous 12 months Vitals Screenings to include cognitive, depression, and falls Referrals and appointments  In addition, I have reviewed and discussed with patient certain preventive protocols, quality metrics, and best practice recommendations. A written personalized care plan for preventive services as well as general preventive health recommendations were provided to patient.     Willette Brace, LPN   [id number]   Nurse Notes: Pt is alert and knowledgeable to questions asked.

## 2022-06-24 ENCOUNTER — Other Ambulatory Visit: Payer: Self-pay | Admitting: Family Medicine

## 2022-06-28 ENCOUNTER — Encounter: Payer: Medicare Other | Admitting: Family Medicine

## 2022-07-27 DIAGNOSIS — D1801 Hemangioma of skin and subcutaneous tissue: Secondary | ICD-10-CM | POA: Diagnosis not present

## 2022-07-27 DIAGNOSIS — L821 Other seborrheic keratosis: Secondary | ICD-10-CM | POA: Diagnosis not present

## 2022-07-27 DIAGNOSIS — Z85828 Personal history of other malignant neoplasm of skin: Secondary | ICD-10-CM | POA: Diagnosis not present

## 2022-07-27 DIAGNOSIS — L570 Actinic keratosis: Secondary | ICD-10-CM | POA: Diagnosis not present

## 2022-07-27 DIAGNOSIS — L814 Other melanin hyperpigmentation: Secondary | ICD-10-CM | POA: Diagnosis not present

## 2022-08-10 ENCOUNTER — Encounter: Payer: Medicare Other | Admitting: Family Medicine

## 2022-08-16 NOTE — Progress Notes (Incomplete)
Subjective:   By signing my name below, I, Jasmine Wilson, attest that this documentation has been prepared under the direction and in the presence of Nance Pear, NP 08/17/22   Patient ID: Jasmine Wilson, female    DOB: 1941-04-07, 82 y.o.   MRN: [id number]  Chief Complaint  Patient presents with   Dysuria    Patient complains of pain with urination    HPI Patient is in today for an office visit.   UTI: she endorses dysuria, frequency, and lower back pain beginning about a week and a half ago. Symptoms went away for a while but while on a trip to [place] for a week symptoms returned. She is unsure about a fever since she did not take her temperature. She endorses chills.   Past Medical History:  Diagnosis Date   Abdominal pain 03/02/2011   Arthritis 03/02/2012   Bronchitis 04/04/2011   Cataract    Chicken pox as a child   Cough 01/29/2013   Female bladder prolapse, acquired 01/29/2011   Glaucoma 01/29/2011   Hearing aid worn    History of pneumonia    HTN (hypertension) 01/29/2011   Hyperlipidemia    Measles as a child   Meniere's disease    Pedal edema 04/01/2016   Preventative health care 01/29/2011   Shingles    Vaginosis 01/29/2013    Past Surgical History:  Procedure Laterality Date   BREAST BIOPSY Left    lump removed on left breast     clogged milk duct, benign   TUBAL LIGATION      Family History  Problem Relation Age of Onset   Breast cancer Mother    Stroke Father    Heart disease Father    Hyperlipidemia Father    Hypertension Father    Breast cancer Sister 70   Uterine cancer Maternal Grandmother        uterus    Social History   Socioeconomic History   Marital status: Widowed    Spouse name: Not on file   Number of children: 3   Years of education: Not on file   Highest education level: Not on file  Occupational History   Occupation: Retired  Tobacco Use   Smoking status: Former    Types: Cigarettes    Quit date: 06/21/1970    Years  since quitting: 52.1   Smokeless tobacco: Never   Tobacco comments:    occasional smoker  Vaping Use   Vaping Use: Never used  Substance and Sexual Activity   Alcohol use: No   Drug use: No   Sexual activity: Never  Other Topics Concern   Not on file  Social History Narrative   Not on file   Social Determinants of Health   Financial Resource Strain: Low Risk  (05/26/2022)   Overall Financial Resource Strain (CARDIA)    Difficulty of Paying Living Expenses: Not hard at all  Food Insecurity: No Food Insecurity (05/26/2022)   Hunger Vital Sign    Worried About Running Out of Food in the Last Year: Never true    [place] in the Last Year: Never true  Transportation Needs: No Transportation Needs (05/26/2022)   PRAPARE - Hydrologist (Medical): No    Lack of Transportation (Non-Medical): No  Physical Activity: Insufficiently Active (05/26/2022)   Exercise Vital Sign    Days of Exercise per Week: 2 days    Minutes of Exercise per Session: 60 min  Stress: No Stress Concern Present (05/26/2022)   [place]    Feeling of Stress : Not at all  Social Connections: Moderately Integrated (05/26/2022)   Social Connection and Isolation Panel [NHANES]    Frequency of Communication with Friends and Family: More than three times a week    Frequency of Social Gatherings with Friends and Family: More than three times a week    Attends Religious Services: More than 4 times per year    Active Member of [employer] or Organizations: Yes    Attends Archivist Meetings: 1 to 4 times per year    Marital Status: Widowed  Intimate Partner Violence: Not At Risk (05/26/2022)   Humiliation, Afraid, Rape, and Kick questionnaire    Fear of Current or Ex-Partner: No    Emotionally Abused: No    Physically Abused: No    Sexually Abused: No    Outpatient Medications Prior to Visit  Medication Sig  Dispense Refill   atorvastatin (LIPITOR) 10 MG tablet TAKE 1 TABLET BY MOUTH DAILY 100 tablet 2   brimonidine (ALPHAGAN) 0.2 % ophthalmic solution SMARTSIG:1 Drop(s) In Eye(s) Every 12 Hours     dorzolamide (TRUSOPT) 2 % ophthalmic solution Place 1 drop into both eyes 2 (two) times daily.      fluticasone (FLONASE) 50 MCG/ACT nasal spray Place 2 sprays into both nostrils daily. 48 g 1   latanoprost (XALATAN) 0.005 % ophthalmic solution Place 1 drop into both eyes at bedtime.      levocetirizine (XYZAL) 5 MG tablet TAKE 1 TABLET BY MOUTH AT  BEDTIME AS NEEDED FOR ALLERGIES 90 tablet 1   metoprolol succinate (TOPROL-XL) 100 MG 24 hr tablet TAKE 1 TABLET BY MOUTH DAILY  TAKE WITH OR IMMEDIATELY  FOLLOWING A MEAL 100 tablet 2   timolol (BETIMOL) 0.5 % ophthalmic solution Place 1 drop into both eyes 2 (two) times daily.     cephALEXin (KEFLEX) 500 MG capsule Take 1 capsule (500 mg total) by mouth 4 (four) times daily. 28 capsule 0   No facility-administered medications prior to visit.    Allergies  Allergen Reactions   Codeine Other (See Comments)    Makes feel funny    Review of Systems  Constitutional:  Positive for chills.  Genitourinary:  Positive for dysuria and frequency.  Musculoskeletal:  Positive for back pain.       Objective:    Physical Exam Constitutional:      General: She is not in acute distress.    Appearance: Normal appearance. She is well-developed.  HENT:     Head: Normocephalic and atraumatic.     Right Ear: External ear normal.     Left Ear: External ear normal.  Eyes:     General: No scleral icterus. Neck:     Thyroid: No thyromegaly.  Cardiovascular:     Rate and Rhythm: Normal rate and regular rhythm.     Heart sounds: Normal heart sounds. No murmur heard. Pulmonary:     Effort: Pulmonary effort is normal. No respiratory distress.     Breath sounds: Normal breath sounds. No wheezing.  Musculoskeletal:     Cervical back: Neck supple.  Skin:     General: Skin is warm and dry.  Neurological:     Mental Status: She is alert and oriented to person, place, and time.  Psychiatric:        Mood and Affect: Mood normal.  Behavior: Behavior normal.        Thought Content: Thought content normal.        Judgment: Judgment normal.     BP 115/85 (BP Location: Left Arm, Patient Position: Sitting, Cuff Size: Large)   Pulse 77   Resp 16   Wt 190 lb (86.2 kg)   SpO2 96%   BMI 28.89 kg/m  Wt Readings from Last 3 Encounters:  08/17/22 190 lb (86.2 kg)  05/26/22 180 lb (81.6 kg)  03/09/22 184 lb (83.5 kg)       Assessment & Plan:  Dysuria Assessment & Plan: UA + leuks, will send for culture and begin empiric rx with keflex.  Pt is advised to call if symptoms worsen or if symptoms do not improve.   Orders: -     POCT Urinalysis Dipstick (Automated) -     Urine Culture  Other orders -     Cephalexin; Take 1 capsule (500 mg total) by mouth 2 (two) times daily.  Dispense: 10 capsule; Refill: 0     I,Rachel Rivera,acting as a scribe for Nance Pear, NP.,have documented all relevant documentation on the behalf of Nance Pear, NP,as directed by  Nance Pear, NP while in the presence of Nance Pear, NP.   I, Nance Pear, NP, personally preformed the services described in this documentation.  All medical record entries made by the scribe were at my direction and in my presence.  I have reviewed the chart and discharge instructions (if applicable) and agree that the record reflects my personal performance and is accurate and complete. 08/17/22   Nance Pear, NP

## 2022-08-17 ENCOUNTER — Ambulatory Visit (INDEPENDENT_AMBULATORY_CARE_PROVIDER_SITE_OTHER): Payer: Medicare Other | Admitting: Family

## 2022-08-17 ENCOUNTER — Encounter: Payer: Self-pay | Admitting: Family

## 2022-08-17 VITALS — BP 115/85 | HR 77 | Resp 16 | Wt 190.0 lb

## 2022-08-17 DIAGNOSIS — R300 Dysuria: Secondary | ICD-10-CM | POA: Insufficient documentation

## 2022-08-17 LAB — POC URINALSYSI DIPSTICK (AUTOMATED)
Bilirubin, UA: NEGATIVE
Blood, UA: NEGATIVE
Glucose, UA: NEGATIVE
Ketones, UA: NEGATIVE
Nitrite, UA: NEGATIVE
Protein, UA: NEGATIVE
Spec Grav, UA: <=1.005 — AB (ref 1.010–1.025)
Urobilinogen, UA: 0.2 E.U./dL

## 2022-08-17 MED ORDER — CEPHALEXIN 500 MG PO CAPS: 500.0000 mg | ORAL_CAPSULE | Freq: Two times a day (BID) | ORAL | 0 refills | Status: DC

## 2022-08-17 NOTE — Assessment & Plan Note (Signed)
UA + leuks, will send for culture and begin empiric rx with keflex.  Pt is advised to call if symptoms worsen or if symptoms do not improve.

## 2022-08-18 ENCOUNTER — Ambulatory Visit
Admission: EM | Admit: 2022-08-18 | Discharge: 2022-08-18 | Disposition: A | Discharge status: Home / Self Care | Payer: Medicare Other | Attending: Nurse Practitioner | Admitting: Nurse Practitioner

## 2022-08-18 ENCOUNTER — Emergency Department (HOSPITAL_BASED_OUTPATIENT_CLINIC_OR_DEPARTMENT_OTHER)
Admission: EM | Admit: 2022-08-18 | Discharge: 2022-08-18 | Disposition: A | Discharge status: Home / Self Care | Payer: Medicare Other | Attending: Emergency Medicine | Admitting: Emergency Medicine

## 2022-08-18 ENCOUNTER — Telehealth: Payer: Self-pay | Admitting: Family Medicine

## 2022-08-18 ENCOUNTER — Encounter (HOSPITAL_BASED_OUTPATIENT_CLINIC_OR_DEPARTMENT_OTHER): Payer: Self-pay | Admitting: Emergency Medicine

## 2022-08-18 ENCOUNTER — Emergency Department (HOSPITAL_BASED_OUTPATIENT_CLINIC_OR_DEPARTMENT_OTHER): Payer: Medicare Other

## 2022-08-18 ENCOUNTER — Other Ambulatory Visit: Payer: Self-pay

## 2022-08-18 DIAGNOSIS — K8020 Calculus of gallbladder without cholecystitis without obstruction: Secondary | ICD-10-CM | POA: Diagnosis not present

## 2022-08-18 DIAGNOSIS — Z79899 Other long term (current) drug therapy: Secondary | ICD-10-CM | POA: Insufficient documentation

## 2022-08-18 DIAGNOSIS — K8080 Other cholelithiasis without obstruction: Secondary | ICD-10-CM

## 2022-08-18 DIAGNOSIS — I700 Atherosclerosis of aorta: Secondary | ICD-10-CM | POA: Diagnosis not present

## 2022-08-18 DIAGNOSIS — R300 Dysuria: Secondary | ICD-10-CM

## 2022-08-18 DIAGNOSIS — K8050 Calculus of bile duct without cholangitis or cholecystitis without obstruction: Secondary | ICD-10-CM | POA: Insufficient documentation

## 2022-08-18 DIAGNOSIS — R109 Unspecified abdominal pain: Secondary | ICD-10-CM | POA: Diagnosis present

## 2022-08-18 DIAGNOSIS — I10 Essential (primary) hypertension: Secondary | ICD-10-CM | POA: Insufficient documentation

## 2022-08-18 LAB — POCT URINALYSIS DIP (MANUAL ENTRY)
Bilirubin, UA: NEGATIVE
Blood, UA: NEGATIVE
Glucose, UA: NEGATIVE mg/dL
Ketones, POC UA: NEGATIVE mg/dL
Nitrite, UA: NEGATIVE
Protein Ur, POC: NEGATIVE mg/dL
Spec Grav, UA: 1.01 (ref 1.010–1.025)
Urobilinogen, UA: 0.2 E.U./dL
pH, UA: 5.5 (ref 5.0–8.0)

## 2022-08-18 LAB — URINALYSIS, MICROSCOPIC (REFLEX)

## 2022-08-18 LAB — URINALYSIS, ROUTINE W REFLEX MICROSCOPIC
Bilirubin Urine: NEGATIVE
Glucose, UA: NEGATIVE mg/dL
Hgb urine dipstick: NEGATIVE
Ketones, ur: NEGATIVE mg/dL
Nitrite: NEGATIVE
Protein, ur: NEGATIVE mg/dL
Specific Gravity, Urine: 1.01 (ref 1.005–1.030)
pH: 6 (ref 5.0–8.0)

## 2022-08-18 LAB — TROPONIN I (HIGH SENSITIVITY)
Troponin I (High Sensitivity): 5 ng/L (ref <18)
Troponin I (High Sensitivity): 5 ng/L (ref <18)

## 2022-08-18 LAB — HEPATIC FUNCTION PANEL
ALT: 15 U/L (ref 0–44)
AST: 18 U/L (ref 15–41)
Albumin: 4 g/dL (ref 3.5–5.0)
Alkaline Phosphatase: 85 U/L (ref 38–126)
Bilirubin, Direct: <0.1 mg/dL (ref 0.0–0.2)
Total Bilirubin: 0.6 mg/dL (ref 0.3–1.2)
Total Protein: 6.9 g/dL (ref 6.5–8.1)

## 2022-08-18 LAB — CBC
HCT: 45.8 % (ref 36.0–46.0)
Hemoglobin: 15.2 g/dL — ABNORMAL HIGH (ref 12.0–15.0)
MCH: 29.7 pg (ref 26.0–34.0)
MCHC: 33.2 g/dL (ref 30.0–36.0)
MCV: 89.5 fL (ref 80.0–100.0)
Platelets: 271 10*3/uL (ref 150–400)
RBC: 5.12 MIL/uL — ABNORMAL HIGH (ref 3.87–5.11)
RDW: 13.6 % (ref 11.5–15.5)
WBC: 10.1 10*3/uL (ref 4.0–10.5)
nRBC: 0 % (ref 0.0–0.2)

## 2022-08-18 LAB — BASIC METABOLIC PANEL
Anion gap: 9 (ref 5–15)
BUN: 18 mg/dL (ref 8–23)
CO2: 23 mmol/L (ref 22–32)
Calcium: 8.9 mg/dL (ref 8.9–10.3)
Chloride: 103 mmol/L (ref 98–111)
Creatinine, Ser: 1.09 mg/dL — ABNORMAL HIGH (ref 0.44–1.00)
GFR, Estimated: 51 mL/min — ABNORMAL LOW (ref >60)
Glucose, Bld: 95 mg/dL (ref 70–99)
Potassium: 4.4 mmol/L (ref 3.5–5.1)
Sodium: 135 mmol/L (ref 135–145)

## 2022-08-18 LAB — LIPASE, BLOOD: Lipase: 34 U/L (ref 11–51)

## 2022-08-18 MED ORDER — HYDROMORPHONE HCL 1 MG/ML IJ SOLN
1.0000 mg | Freq: Once | INTRAMUSCULAR | Status: AC
  Administered 2022-08-18: 1 mg via INTRAVENOUS
  Filled 2022-08-18: qty 1

## 2022-08-18 MED ORDER — IOHEXOL 350 MG/ML SOLN
100.0000 mL | Freq: Once | INTRAVENOUS | Status: AC | PRN
  Administered 2022-08-18: 100 mL via INTRAVENOUS

## 2022-08-18 MED ORDER — MORPHINE SULFATE (PF) 4 MG/ML IV SOLN
4.0000 mg | Freq: Once | INTRAVENOUS | Status: AC
  Administered 2022-08-18: 4 mg via INTRAVENOUS
  Filled 2022-08-18: qty 1

## 2022-08-18 MED ORDER — HYDRALAZINE HCL 20 MG/ML IJ SOLN
10.0000 mg | Freq: Once | INTRAMUSCULAR | Status: AC
  Administered 2022-08-18: 10 mg via INTRAVENOUS
  Filled 2022-08-18: qty 1

## 2022-08-18 MED ORDER — ONDANSETRON 4 MG PO TBDP: ORAL_TABLET | ORAL | 0 refills | Status: DC

## 2022-08-18 MED ORDER — HYDROCODONE-ACETAMINOPHEN 5-325 MG PO TABS: 1.0000 | ORAL_TABLET | Freq: Four times a day (QID) | ORAL | 0 refills | Status: DC | PRN

## 2022-08-18 MED ORDER — ONDANSETRON HCL 4 MG/2ML IJ SOLN
4.0000 mg | Freq: Once | INTRAMUSCULAR | Status: AC
  Administered 2022-08-18: 4 mg via INTRAVENOUS
  Filled 2022-08-18: qty 2

## 2022-08-18 NOTE — ED Provider Notes (Signed)
[facility] [place] Provider Note   CSN: [id number] Arrival date & time: 08/18/22  1827     History  Chief Complaint  Patient presents with   Flank Pain    Jasmine Wilson is a 82 y.o. female history of hypertension, UTI here presenting with flank pain.  Patient states that she had acute onset of right flank pain for the last 2 to 3 days got worse today.  Patient states that she went to urgent care and was noted to be hypertensive and was sent here for further evaluation.  Urinalysis at urgent care did not show an obvious UTI.  Patient is sent here for further evaluation.  The history is provided by the patient.       Home Medications Prior to Admission medications   Medication Sig Start Date End Date Taking? Authorizing Provider  atorvastatin (LIPITOR) 10 MG tablet TAKE 1 TABLET BY MOUTH DAILY 06/24/22   [name], MD  brimonidine [facility]) 0.2 % ophthalmic solution SMARTSIG:1 Drop(s) In Eye(s) Every 12 Hours 01/20/22   [provider]  cephALEXin (KEFLEX) 500 MG capsule Take 1 capsule (500 mg total) by mouth 2 (two) times daily. 08/17/22   Debbrah Alar, NP  dorzolamide (TRUSOPT) 2 % ophthalmic solution Place 1 drop into both eyes 2 (two) times daily.  01/26/11   [provider]  fluticasone (FLONASE) 50 MCG/ACT nasal spray Place 2 sprays into both nostrils daily. 11/06/20   [name], MD  latanoprost (XALATAN) 0.005 % ophthalmic solution Place 1 drop into both eyes at bedtime.  11/30/10   [provider]  levocetirizine (XYZAL) 5 MG tablet TAKE 1 TABLET BY MOUTH AT  BEDTIME AS NEEDED FOR ALLERGIES 11/17/21   [name], MD  metoprolol succinate (TOPROL-XL) 100 MG 24 hr tablet TAKE 1 TABLET BY MOUTH DAILY  TAKE WITH OR IMMEDIATELY  FOLLOWING A MEAL 06/24/22   [name], MD  timolol (BETIMOL) 0.5 % ophthalmic solution Place 1 drop into both eyes 2 (two) times daily.    [provider]       Allergies    Codeine    Review of Systems   Review of Systems  Genitourinary:  Positive for flank pain.  All other systems reviewed and are negative.   Physical Exam Updated Vital Signs BP (!) 134/45 (BP Location: Left Arm)   Pulse 80   Temp 98.1 F (36.7 C) (Oral)   Resp 20   SpO2 96%  Physical Exam Vitals and nursing note reviewed.  Constitutional:      Appearance: Normal appearance.     Comments: Uncomfortable  HENT:     Head: Normocephalic.     Nose: Nose normal.     Mouth/Throat:     Mouth: Mucous membranes are moist.  Eyes:     Extraocular Movements: Extraocular movements intact.     Pupils: Pupils are equal, round, and reactive to light.  Cardiovascular:     Rate and Rhythm: Normal rate and regular rhythm.     Pulses: Normal pulses.     Heart sounds: Normal heart sounds.  Pulmonary:     Effort: Pulmonary effort is normal.     Breath sounds: Normal breath sounds.  Abdominal:     General: Abdomen is flat.     Comments: + Mild right upper quadrant tenderness and right CVA tenderness  Musculoskeletal:        General: Normal range of motion.  Cervical back: Normal range of motion and neck supple.  Skin:    General: Skin is warm.     Capillary Refill: Capillary refill takes less than 2 seconds.  Neurological:     General: No focal deficit present.     Mental Status: She is oriented to person, place, and time.  Psychiatric:        Mood and Affect: Mood normal.        Behavior: Behavior normal.     ED Results / Procedures / Treatments   Labs (all labs ordered are listed, but only abnormal results are displayed) Labs Reviewed  URINALYSIS, ROUTINE W REFLEX MICROSCOPIC - Abnormal; Notable for the following components:      Result Value   Leukocytes,Ua SMALL (*)    All other components within normal limits  BASIC METABOLIC PANEL - Abnormal; Notable for the following components:   Creatinine, Ser 1.09 (*)    GFR, Estimated 51 (*)    All other  components within normal limits  CBC - Abnormal; Notable for the following components:   RBC 5.12 (*)    Hemoglobin 15.2 (*)    All other components within normal limits  URINALYSIS, MICROSCOPIC (REFLEX) - Abnormal; Notable for the following components:   Bacteria, UA RARE (*)    All other components within normal limits  URINE CULTURE  HEPATIC FUNCTION PANEL  LIPASE, BLOOD  TROPONIN I (HIGH SENSITIVITY)  TROPONIN I (HIGH SENSITIVITY)    EKG None  Radiology CT Angio Chest/Abd/Pel for Dissection W and/or Wo Contrast  Result Date: 08/18/2022 CLINICAL DATA:  Acute aortic syndrome suspected EXAM: CT ANGIOGRAPHY CHEST, ABDOMEN AND PELVIS TECHNIQUE: Non-contrast CT of the chest was initially obtained. Multidetector CT imaging through the chest, abdomen and pelvis was performed using the standard protocol during bolus administration of intravenous contrast. Multiplanar reconstructed images and MIPs were obtained and reviewed to evaluate the vascular anatomy. RADIATION DOSE REDUCTION: This exam was performed according to the departmental dose-optimization program which includes automated exposure control, adjustment of the mA and/or kV according to patient size and/or use of iterative reconstruction technique. CONTRAST:  168m OMNIPAQUE IOHEXOL 350 MG/ML SOLN COMPARISON:  None Available. FINDINGS: CTA CHEST FINDINGS Cardiovascular: Preferential opacification of the thoracic aorta. No evidence of thoracic aortic aneurysm or dissection. Normal heart size. No pericardial effusion. There are atherosclerotic calcifications of the aorta. Mediastinum/Nodes: There are numerous bilateral thyroid nodules. The largest is in the left lobe measuring 14 mm. There are no enlarged mediastinal or hilar lymph nodes. The esophagus is nondilated. There is a small hiatal hernia. Lungs/Pleura: There is a small amount of dependent atelectasis in the bilateral lower lobes. There is a small amount of atelectasis in the left  lung base. The lungs are otherwise clear. There is no pleural effusion or pneumothorax. Musculoskeletal: No acute osseous findings. Review of the MIP images confirms the above findings. CTA ABDOMEN AND PELVIS FINDINGS VASCULAR Aorta: Normal caliber aorta without aneurysm, dissection, vasculitis or significant stenosis. There is mild calcified atherosclerotic disease throughout the aorta. Celiac: Patent without evidence of aneurysm, dissection, vasculitis or significant stenosis. SMA: Patent without evidence of aneurysm, dissection, vasculitis or significant stenosis. Renals: There is moderate focal stenosis at the origin of the right renal artery secondary to atherosclerotic disease. Left renal artery within normal limits. IMA: Patent without evidence of aneurysm, dissection, vasculitis or significant stenosis. Inflow: Patent without evidence of aneurysm, dissection, vasculitis or significant stenosis. Veins: No obvious venous abnormality within the limitations of this arterial phase  study. Review of the MIP images confirms the above findings. NON-VASCULAR Hepatobiliary: Gallstones are present. There is no biliary ductal dilatation. The liver is within normal limits. Pancreas: Unremarkable. No pancreatic ductal dilatation or surrounding inflammatory changes. Spleen: Normal in size without focal abnormality. Adrenals/Urinary Tract: There is no hydronephrosis or perinephric fluid. There is bilateral renal cortical scarring, left greater than right. There is a punctate nonobstructing right renal calculus. The adrenal glands the bladder are within normal limits. Stomach/Bowel: Stomach is within normal limits. Appendix appears normal. No evidence of bowel wall thickening, distention, or inflammatory changes. There is descending and sigmoid colon diverticulosis. Lymphatic: No enlarged lymph nodes are seen. Reproductive: Uterus and bilateral adnexa are unremarkable. Other: There is no ascites. There is a small fat  containing umbilical hernia. Musculoskeletal: No acute or significant osseous findings. Review of the MIP images confirms the above findings. IMPRESSION: 1. No evidence for aortic dissection or aneurysm. 2. 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 3. Cholelithiasis. 4. Nonobstructing right renal calculus. 5. Colonic diverticulosis. 6. 1.4 cm incidental thyroid nodule. No follow-up imaging is recommended. Reference: J Am Coll Radiol. 2015 Feb;12(2): 143-50 7.  Aortic Atherosclerosis (ICD10-I70.0). Electronically Signed   By: Ronney Asters M.D.   On: 08/18/2022 21:09    Procedures Procedures    Medications Ordered in ED Medications  morphine (PF) 4 MG/ML injection 4 mg (4 mg Intravenous Given 08/18/22 2005)  ondansetron (ZOFRAN) injection 4 mg (4 mg Intravenous Given 08/18/22 2004)  hydrALAZINE (APRESOLINE) injection 10 mg (10 mg Intravenous Given 08/18/22 2004)  iohexol (OMNIPAQUE) 350 MG/ML injection 100 mL (100 mLs Intravenous Contrast Given 08/18/22 2041)    ED Course/ Medical Decision Making/ A&P                             Medical Decision Making Jasmine Wilson is a 82 y.o. female here presenting with right flank pain and right upper quadrant pain.  Concern for possible cholecystitis versus renal colic versus dissection.  Plan to get CT dissection study and CBC and CMP and lipase and troponin x 2.  Will give pain medicine and IV hydralazine and reassess  11:12 PM Patient blood pressure is down to 130s.  I reviewed patient's labs and independently interpreted imaging studies.  CTA did not show any dissection.  Patient had nonobstructive right renal stone and also gallstones.  I suspect that her pain is actually from gallstones.  Troponin negative x 2.  Patient's pain is under control.  Given her age and risk factors, I recommend pain medicine and outpatient surgical follow-up.  I also recommend diet modification.  Gave strict return precautions  Problems  Addressed: Biliary calculus of other site without obstruction: acute illness or injury Hypertension, unspecified type: acute illness or injury  Amount and/or Complexity of Data Reviewed Labs: ordered. Decision-making details documented in ED Course. Radiology: ordered and independent interpretation performed. Decision-making details documented in ED Course.  Risk Prescription drug management.    Final Clinical Impression(s) / ED Diagnoses Final diagnoses:  None    Rx / DC Orders ED Discharge Orders     None         Drenda Freeze, MD 08/18/22 2313

## 2022-08-18 NOTE — ED Triage Notes (Signed)
Yesterday pt was given prescription for UTI, she states this morning she is now having back pain that radiates to abd.   Started: today

## 2022-08-18 NOTE — ED Triage Notes (Addendum)
Pt here from UC; c/o pain to RT flank w/ some radiation to RUQ; took Aleve x 3 today; pain started today and has steadily increased

## 2022-08-18 NOTE — Discharge Instructions (Addendum)
Please go to the emergency room for further evaluation of your symptoms

## 2022-08-18 NOTE — ED Notes (Signed)
Patient is being discharged from the Urgent Care and sent to the Emergency Department via POV with family. Per Rosario Adie NP , patient is in need of higher level of care due to need for further evaluation . Patient is aware and verbalizes understanding of plan of care.  Vitals:   08/18/22 1731 08/18/22 1740  BP: (!) 175/89 (!) 177/93  Pulse: 74   Resp: 16   Temp: (!) 97.4 F (36.3 C)   SpO2: 95%

## 2022-08-18 NOTE — ED Provider Notes (Signed)
[facility]    CSN: [id number] Arrival date & time: 08/18/22  1717      History   Chief Complaint Chief Complaint  Patient presents with   Back Pain   Abdominal Pain    HPI Jasmine Wilson is a 82 y.o. female presents for evaluation of flank pain.  Patient was seen by her PCP yesterday for dysuria.  She was placed on Keflex and urine culture pending.  Patient reports today she awoke with right flank pain that has been increasing since that time.  She now states it is a constant 10 out of 10 and radiates through her back towards her abdomen.  She denies any nausea/vomiting or fevers.  Denies dysuria.  Denies history of kidney stones or pyelonephritis.  She is taken 3 Aleve and Tylenol with no improvement in her pain.  States she has had a broken back in the past and this pain is much worse.  She does not feel it is musculoskeletal in nature.   Back Pain Associated symptoms: abdominal pain   Abdominal Pain   Past Medical History:  Diagnosis Date   Abdominal pain 03/02/2011   Arthritis 03/02/2012   Bronchitis 04/04/2011   Cataract    Chicken pox as a child   Cough 01/29/2013   Female bladder prolapse, acquired 01/29/2011   Glaucoma 01/29/2011   Hearing aid worn    History of pneumonia    HTN (hypertension) 01/29/2011   Hyperlipidemia    Measles as a child   Meniere's disease    Pedal edema 04/01/2016   Preventative health care 01/29/2011   Shingles    Vaginosis 01/29/2013    Patient Active Problem List   Diagnosis Date Noted   Dysuria 08/17/2022   Acute cystitis with hematuria 03/09/2022   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[place]) 01/07/2022   Nonintractable headache 04/08/2019   Visual changes 04/08/2019   Abnormal hemoglobin (Hgb) ([place]) 04/01/2019   Right hip pain 10/10/2017   Environmental allergies 10/10/2017   Pedal edema 04/01/2016   Medicare annual wellness visit, subsequent 10/06/2014   Snoring 10/06/2014   Sun-damaged skin 10/06/2014   COVID 01/29/2013    Vaginosis 01/29/2013   Arthritis 03/02/2012   Abdominal pain 03/02/2011   Female bladder prolapse, acquired 01/29/2011   Preventative health care 01/29/2011   HTN (hypertension) 01/29/2011   Glaucoma 01/29/2011   Hyperlipidemia    Meniere's disease    Shingles    Hearing aid worn    Measles    Chicken pox     Past Surgical History:  Procedure Laterality Date   BREAST BIOPSY Left    lump removed on left breast     clogged milk duct, benign   TUBAL LIGATION      OB History   No obstetric history on file.      Home Medications    Prior to Admission medications   Medication Sig Start Date End Date Taking? Authorizing Provider  atorvastatin (LIPITOR) 10 MG tablet TAKE 1 TABLET BY MOUTH DAILY 06/24/22   [name], MD  brimonidine [facility]) 0.2 % ophthalmic solution SMARTSIG:1 Drop(s) In Eye(s) Every 12 Hours 01/20/22   [provider]  cephALEXin (KEFLEX) 500 MG capsule Take 1 capsule (500 mg total) by mouth 2 (two) times daily. 08/17/22   Debbrah Alar, NP  dorzolamide (TRUSOPT) 2 % ophthalmic solution Place 1 drop into both eyes 2 (two) times daily.  01/26/11   [provider]  fluticasone (FLONASE) 50 MCG/ACT nasal spray  Place 2 sprays into both nostrils daily. 11/06/20   [name], MD  latanoprost (XALATAN) 0.005 % ophthalmic solution Place 1 drop into both eyes at bedtime.  11/30/10   [provider]  levocetirizine (XYZAL) 5 MG tablet TAKE 1 TABLET BY MOUTH AT  BEDTIME AS NEEDED FOR ALLERGIES 11/17/21   [name], MD  metoprolol succinate (TOPROL-XL) 100 MG 24 hr tablet TAKE 1 TABLET BY MOUTH DAILY  TAKE WITH OR IMMEDIATELY  FOLLOWING A MEAL 06/24/22   [name], MD  timolol (BETIMOL) 0.5 % ophthalmic solution Place 1 drop into both eyes 2 (two) times daily.    [provider]    Family History Family History  Problem Relation Age of Onset   Breast cancer Mother    Stroke Father    Heart disease Father     Hyperlipidemia Father    Hypertension Father    Breast cancer Sister 1   Uterine cancer Maternal Grandmother        uterus    Social History Social History   Tobacco Use   Smoking status: Former    Types: Cigarettes    Quit date: 06/21/1970    Years since quitting: 52.1   Smokeless tobacco: Never   Tobacco comments:    occasional smoker  Vaping Use   Vaping Use: Never used  Substance Use Topics   Alcohol use: No   Drug use: No     Allergies   Codeine   Review of Systems Review of Systems  Gastrointestinal:  Positive for abdominal pain.  Musculoskeletal:  Positive for back pain.     Physical Exam Triage Vital Signs ED Triage Vitals  Enc Vitals Group     BP 08/18/22 1731 (!) 175/89     Pulse Rate 08/18/22 1731 74     Resp 08/18/22 1731 16     Temp 08/18/22 1731 (!) 97.4 F (36.3 C)     Temp Source 08/18/22 1731 Oral     SpO2 08/18/22 1731 95 %     Weight --      Height --      Head Circumference --      Peak Flow --      Pain Score 08/18/22 1738 10     Pain Loc --      Pain Edu? --      Excl. in [place]? --    No data found.  Updated Vital Signs BP (!) 177/93 (BP Location: Right Arm)   Pulse 74   Temp (!) 97.4 F (36.3 C) (Oral)   Resp 16   SpO2 95%   Visual Acuity Right Eye Distance:   Left Eye Distance:   Bilateral Distance:    Right Eye Near:   Left Eye Near:    Bilateral Near:     Physical Exam Vitals and nursing note reviewed.  Constitutional:      Appearance: She is well-developed.  HENT:     Head: Normocephalic and atraumatic.  Eyes:     Pupils: Pupils are equal, round, and reactive to light.  Cardiovascular:     Rate and Rhythm: Normal rate.  Pulmonary:     Effort: Pulmonary effort is normal.  Abdominal:     General: Bowel sounds are normal.     Tenderness: There is abdominal tenderness in the right lower quadrant. There is right CVA tenderness. Negative signs include Rovsing's sign and McBurney's sign.  Skin:    General:  Skin is warm and  dry.  Neurological:     General: No focal deficit present.     Mental Status: She is alert and oriented to person, place, and time.  Psychiatric:        Mood and Affect: Mood normal.        Behavior: Behavior normal.      UC Treatments / Results  Labs (all labs ordered are listed, but only abnormal results are displayed) Labs Reviewed  POCT URINALYSIS DIP (MANUAL ENTRY) - Abnormal; Notable for the following components:      Result Value   Leukocytes, UA Small (1+) (*)    All other components within normal limits    EKG   Radiology No results found.  Procedures Procedures (including critical care time)  Medications Ordered in UC Medications - No data to display  Initial Impression / Assessment and Plan / UC Course  I have reviewed the triage vital signs and the nursing notes.  Pertinent labs & imaging results that were available during my care of the patient were reviewed by me and considered in my medical decision making (see chart for details).     Reviewed exam and symptoms with patient No blood on urine, doubt kidney stone.  Given her 10 out of 10 pain that has not been touched by over-the-counter analgesics I advised ER evaluation for at minimum pain management.  I am unable to give her Toradol here as she is taken 3 Aleve today.  Discussed treating for pyelo with ceftriaxone in clinic and changing oral medication but her primary concern now is pain management.  Discussed this with patient and her son-in-law who are in agreement and will go POV to the emergency room. Final Clinical Impressions(s) / UC Diagnoses   Final diagnoses:  Dysuria   Discharge Instructions   None    ED Prescriptions   None    PDMP not reviewed this encounter.   Melynda Ripple, NP 08/18/22 [facility]

## 2022-08-18 NOTE — Discharge Instructions (Addendum)
As we discussed, you have gallstones that is likely causing your pain.  I recommend that you eat low-fat diet  I also recommend that you take Norco for pain and take Zofran before you take pain medicine so you will not be nauseated  See your doctor for follow-up  Please take your blood pressure medicine as prescribed  Return to ER if you have worse abdominal pain or vomiting or chest pain

## 2022-08-18 NOTE — Telephone Encounter (Signed)
Pt states she saw Melissa for a uti. Today she is having such bad back pain it is hard to breath. Advised pt a message would be sent to the provider and transferred to triage as she was not sure she could wait for a provider's response.

## 2022-08-18 NOTE — Telephone Encounter (Signed)
Initial Comment Caller states she was in yesterday for a UTI. She was prescribed antibiotics. She woke up with back pain today and she wants advice. Translation No Nurse Assessment Nurse: Tanya Nones, RN, [place] Date/Time (Eastern Time): 08/18/2022 4:31:09 PM Confirm and document reason for call. If symptomatic, describe symptoms. ---Caller states she was in yesterday for a UTI. She was prescribed antibiotics. She woke up with back pain today and she wants advice. She reports the pain is a 10/10. She took Tylenol, ibuprofen, and naproxen and none of it helped. Does the patient have any new or worsening symptoms? ---Yes Will a triage be completed? ---Yes Related visit to physician within the last 2 weeks? ---Yes Does the PT have any chronic conditions? (i.e. diabetes, asthma, this includes High risk factors for pregnancy, etc.) ---No Is this a behavioral health or substance abuse call? ---No Guidelines Guideline Title Affirmed Question Affirmed Notes Nurse Date/Time Eilene Ghazi Time) Urinary Tract Infection on Antibiotic Follow-up Call - Female [1] SEVERE pain (e.g., excruciating) AND [2] no improvement 2 hours after pain medications Tanya Nones, RN, [place] Hawks 08/18/2022 4:32:28 PM PLEASE NOTE: All timestamps contained within this report are represented as [place] Standard Time. CONFIDENTIALTY NOTICE: This fax transmission is intended only for the addressee. It contains information that is legally privileged, confidential or otherwise protected from use or disclosure. If you are not the intended recipient, you are strictly prohibited from reviewing, disclosing, copying using or disseminating any of this information or taking any action in reliance on or regarding this information. If you have received this fax in error, please notify us immediately by telephone so that we can arrange for its return to [place]. Phone: [phone], Toll-Free: [phone], Fax: [phone] Page: 2 of  2 Call Id: [id number] [place]. Time Eilene Ghazi Time) Disposition Final User 08/18/2022 4:33:48 PM See HCP within 4 Hours (or PCP triage) Yes Tanya Nones, [place], [place] Final Disposition 08/18/2022 4:33:48 PM See HCP within 4 Hours (or PCP triage) Yes Tanya Nones, RN, Beatrix Shipper Disagree/Comply Comply Caller Understands Yes PreDisposition Did not know what to do Care Advice Given Per Guideline SEE HCP (OR PCP TRIAGE) WITHIN 4 HOURS: * IF OFFICE WILL BE OPEN: You need to be seen within the next 3 or 4 hours. Call your doctor (or NP/PA) now or as soon as the office opens. CALL BACK IF: * You become worse CARE ADVICE given per Urinary Tract Infection on Antibiotic Follow-Up Call, Female (Adult) guideline. Referrals REFERRED TO PCP OFFICE [facility]

## 2022-08-19 ENCOUNTER — Encounter: Payer: Self-pay | Admitting: Family Medicine

## 2022-08-19 ENCOUNTER — Ambulatory Visit (INDEPENDENT_AMBULATORY_CARE_PROVIDER_SITE_OTHER): Payer: Medicare Other | Admitting: Family Medicine

## 2022-08-19 VITALS — BP 124/80 | HR 72 | Temp 97.6°F | Resp 18 | Ht 68.0 in | Wt 189.8 lb

## 2022-08-19 DIAGNOSIS — R1011 Right upper quadrant pain: Secondary | ICD-10-CM

## 2022-08-19 LAB — URINE CULTURE
MICRO NUMBER:: 14620568
SPECIMEN QUALITY:: ADEQUATE

## 2022-08-19 MED ORDER — HYDROCODONE-ACETAMINOPHEN 7.5-325 MG PO TABS: 1.0000 | ORAL_TABLET | Freq: Four times a day (QID) | ORAL | 0 refills | Status: AC | PRN

## 2022-08-19 NOTE — Progress Notes (Signed)
Acute Office Visit  Subjective:     Patient ID: Jasmine Wilson, female    DOB: 28-Sep-1940, 82 y.o.   MRN: [id number]  Chief Complaint  Patient presents with   follow up ER Gallbladder    Concerns/ questions: 1.pt says her pain meds are not helping much. 2. Do you agree that she needs surgery.     HPI Patient is in today for ED follow-up.  Patient was seen in our office on 08/17/2022 by Lemar Livings, NP for dysuria.  UA was positive for leukocytes and it was sent for culture.  She was started on empiric Keflex.  She went to urgent care yesterday with back pain, right flank 10/10 radiating towards RUQ.  Given the severity of her pain and hypertension, she was instructed to go to the emergency department.  She had a CTA showing a nonobstructive right renal stone and gallstones.  They suspected the pain was actually from the gallstones.  She was recommended to continue with outpatient pain management and referral to surgical team.  Today she reports pain is not responding as expected to [place]. She is still reporting 8/10 RUQ/right back pain. The norco has caused some nausea which is improved with Zofran. She is here with her son-in-law today and is wanting our opinion on whether the surgical consult is necessary. Patient denies any chest pain, palpitations, dyspnea, wheezing, edema, recurrent headaches, vision changes, fevers, chills, vomiting, diarrhea.     ROS All review of systems negative except what is listed in the HPI      Objective:    BP 124/80 (BP Location: Left Arm, Patient Position: Sitting, Cuff Size: Normal)   Pulse 72   Temp 97.6 F (36.4 C) (Oral)   Resp 18   Ht '5\' 8"'$  (1.727 m)   Wt 189 lb 12.8 oz (86.1 kg)   SpO2 96%   BMI 28.86 kg/m    Physical Exam Constitutional:      Appearance: Normal appearance.  Cardiovascular:     Rate and Rhythm: Normal rate and regular rhythm.  Pulmonary:     Effort: Pulmonary effort is normal.     Breath sounds: Normal  breath sounds.  Abdominal:     General: Abdomen is flat. Bowel sounds are normal.     Palpations: Abdomen is soft.     Tenderness: There is abdominal tenderness. There is no right CVA tenderness or left CVA tenderness.     Comments: RUQ discomfort with palpation  Skin:    General: Skin is warm and dry.  Neurological:     General: No focal deficit present.     Mental Status: She is alert and oriented to person, place, and time. Mental status is at baseline.  Psychiatric:        Mood and Affect: Mood normal.        Behavior: Behavior normal.        Thought Content: Thought content normal.        Judgment: Judgment normal.          No results found for any visits on 08/19/22.      Assessment & Plan:   Problem List Items Addressed This Visit   None Visit Diagnoses     RUQ pain    -  Primary Discussed case with PCP - Dr. Charlett Blake spoke with patient. Advised her that given the severity of her discomfort, surgical consult was recommended to evaluate cholelithiasis seen on CT. They agreed to referral. Thorough discussion regarding  pain management and safety. Increasing Norco to 7.5-325 mg q6h PRN and recommended she add ibuprofen 200 mg BID and acetaminophen 500 mg BID for the next few days until pain improves or sees surgery. Discussed constipation prevention measures including adequate fluid and fiber intake, Benefiber and Docusate. Discussed diet changes for cholecystitis/cholelithiasis.  Patient aware of signs/symptoms requiring further/urgent evaluation.       Relevant Medications   HYDROcodone-acetaminophen (NORCO) 7.5-325 MG tablet   Other Relevant Orders   Ambulatory referral to General Surgery       Meds ordered this encounter  Medications   HYDROcodone-acetaminophen (NORCO) 7.5-325 MG tablet    Sig: Take 1 tablet by mouth every 6 (six) hours as needed for up to 3 days for moderate pain.    Dispense:  12 tablet    Refill:  0    Order Specific Question:    Supervising Provider    Answer:   Penni Homans A [[id number]]    Return if symptoms worsen or fail to improve.  Terrilyn Saver, NP

## 2022-08-19 NOTE — Telephone Encounter (Signed)
Pt was seen in ER.

## 2022-08-19 NOTE — Patient Instructions (Addendum)
Advil 200 mg twice a day Tylenol 500 mg twice a day  Norco as needed every 6 hours - sending in slightly higher dose for you. We discussed safety with this medication. Be proactive to prevent constipation (see below).  Surgical referral placed   Low fat diet!   CONSTIPATION:  ALL THE TIME:  Lifestyle measures Hydration: drink plenty of water Physical activity: at least walking daily High-fiber foods Bulk forming laxatives  Psyllium (Konsyl; Metamucil; Perdiem) Methylcellulose (Citrucel) Calcium polycarbophil (FiberCon; Fiber-Lax; Mitrolan) Wheat dextrin (Benefiber)  AS NEEDED FOR 3-5 DAYS AT A TIME OR ALL THE TIME 1-2 TIMES PER WEEK  (CAN TAKE ONE FROM EACH CATEGORY E.G. MIRALAX + SENNA) Stool softener: Docusate 100 mg BID Hyperosmolar or saline laxatives: Polyethylene glycol (MiraLAX, GlycoLax) Lactulose Sorbitol Magnesium hydroxide (Milk of Magnesia)  Magnesium citrate (Evac-Q-Mag)  Stimulant laxatives Senna (eg, Black Draught, Ex-Lax, Fletcher's, Castoria, Senokot)  Bisacodyl (eg, Correctol, Doxidan, Dulcolax).  Taking stimulant laxatives regularly or in large amounts can cause side effects, including low potassium levels. Thus, you should take these drugs carefully if you must use them regularly.

## 2022-08-20 ENCOUNTER — Emergency Department (HOSPITAL_COMMUNITY): Payer: Medicare Other

## 2022-08-20 ENCOUNTER — Observation Stay (HOSPITAL_COMMUNITY)
Admission: EM | Admit: 2022-08-20 | Discharge: 2022-08-22 | Disposition: A | Discharge status: Home / Self Care | Payer: Medicare Other | Attending: Surgery | Admitting: Surgery

## 2022-08-20 ENCOUNTER — Encounter (HOSPITAL_COMMUNITY): Payer: Self-pay | Admitting: *Deleted

## 2022-08-20 ENCOUNTER — Telehealth: Payer: Self-pay | Admitting: Family Medicine

## 2022-08-20 ENCOUNTER — Other Ambulatory Visit: Payer: Self-pay

## 2022-08-20 DIAGNOSIS — Z8616 Personal history of COVID-19: Secondary | ICD-10-CM | POA: Insufficient documentation

## 2022-08-20 DIAGNOSIS — K760 Fatty (change of) liver, not elsewhere classified: Secondary | ICD-10-CM | POA: Diagnosis not present

## 2022-08-20 DIAGNOSIS — I10 Essential (primary) hypertension: Secondary | ICD-10-CM | POA: Insufficient documentation

## 2022-08-20 DIAGNOSIS — R1011 Right upper quadrant pain: Secondary | ICD-10-CM | POA: Diagnosis not present

## 2022-08-20 DIAGNOSIS — K8012 Calculus of gallbladder with acute and chronic cholecystitis without obstruction: Secondary | ICD-10-CM | POA: Diagnosis not present

## 2022-08-20 DIAGNOSIS — Z87891 Personal history of nicotine dependence: Secondary | ICD-10-CM | POA: Insufficient documentation

## 2022-08-20 DIAGNOSIS — Z79899 Other long term (current) drug therapy: Secondary | ICD-10-CM | POA: Insufficient documentation

## 2022-08-20 DIAGNOSIS — K8020 Calculus of gallbladder without cholecystitis without obstruction: Secondary | ICD-10-CM | POA: Diagnosis present

## 2022-08-20 LAB — URINALYSIS, ROUTINE W REFLEX MICROSCOPIC
Bilirubin Urine: NEGATIVE
Glucose, UA: NEGATIVE mg/dL
Hgb urine dipstick: NEGATIVE
Ketones, ur: 20 mg/dL — AB
Leukocytes,Ua: NEGATIVE
Nitrite: NEGATIVE
Protein, ur: NEGATIVE mg/dL
Specific Gravity, Urine: 1.03 (ref 1.005–1.030)
pH: 5 (ref 5.0–8.0)

## 2022-08-20 LAB — COMPREHENSIVE METABOLIC PANEL
ALT: 17 U/L (ref 0–44)
AST: 19 U/L (ref 15–41)
Albumin: 3.9 g/dL (ref 3.5–5.0)
Alkaline Phosphatase: 63 U/L (ref 38–126)
Anion gap: 9 (ref 5–15)
BUN: 14 mg/dL (ref 8–23)
CO2: 23 mmol/L (ref 22–32)
Calcium: 9 mg/dL (ref 8.9–10.3)
Chloride: 99 mmol/L (ref 98–111)
Creatinine, Ser: 0.85 mg/dL (ref 0.44–1.00)
GFR, Estimated: >60 mL/min (ref >60)
Glucose, Bld: 92 mg/dL (ref 70–99)
Potassium: 4.1 mmol/L (ref 3.5–5.1)
Sodium: 131 mmol/L — ABNORMAL LOW (ref 135–145)
Total Bilirubin: 0.9 mg/dL (ref 0.3–1.2)
Total Protein: 6.6 g/dL (ref 6.5–8.1)

## 2022-08-20 LAB — CBC WITH DIFFERENTIAL/PLATELET
Abs Immature Granulocytes: 0.06 10*3/uL (ref 0.00–0.07)
Basophils Absolute: 0.1 10*3/uL (ref 0.0–0.1)
Basophils Relative: 1 %
Eosinophils Absolute: 0 10*3/uL (ref 0.0–0.5)
Eosinophils Relative: 0 %
HCT: 43.5 % (ref 36.0–46.0)
Hemoglobin: 14.2 g/dL (ref 12.0–15.0)
Immature Granulocytes: 1 %
Lymphocytes Relative: 18 %
Lymphs Abs: 1.7 10*3/uL (ref 0.7–4.0)
MCH: 29.3 pg (ref 26.0–34.0)
MCHC: 32.6 g/dL (ref 30.0–36.0)
MCV: 89.9 fL (ref 80.0–100.0)
Monocytes Absolute: 0.6 10*3/uL (ref 0.1–1.0)
Monocytes Relative: 7 %
Neutro Abs: 7.2 10*3/uL (ref 1.7–7.7)
Neutrophils Relative %: 73 %
Platelets: 253 10*3/uL (ref 150–400)
RBC: 4.84 MIL/uL (ref 3.87–5.11)
RDW: 13.6 % (ref 11.5–15.5)
WBC: 9.7 10*3/uL (ref 4.0–10.5)
nRBC: 0 % (ref 0.0–0.2)

## 2022-08-20 LAB — LIPASE, BLOOD: Lipase: 42 U/L (ref 11–51)

## 2022-08-20 MED ORDER — LATANOPROST 0.005 % OP SOLN
1.0000 [drp] | Freq: Every day | OPHTHALMIC | Status: DC
  Administered 2022-08-20 – 2022-08-21 (×2): 1 [drp] via OPHTHALMIC
  Filled 2022-08-20: qty 2.5

## 2022-08-20 MED ORDER — SODIUM CHLORIDE 0.9 % IV SOLN
2.0000 g | INTRAVENOUS | Status: DC
  Administered 2022-08-20 – 2022-08-21 (×2): 2 g via INTRAVENOUS
  Filled 2022-08-20 (×3): qty 20

## 2022-08-20 MED ORDER — LORATADINE 10 MG PO TABS
10.0000 mg | ORAL_TABLET | Freq: Every day | ORAL | Status: DC
  Administered 2022-08-20 – 2022-08-21 (×2): 10 mg via ORAL
  Filled 2022-08-20 (×2): qty 1

## 2022-08-20 MED ORDER — ONDANSETRON HCL 4 MG/2ML IJ SOLN
4.0000 mg | Freq: Once | INTRAMUSCULAR | Status: AC
  Administered 2022-08-20: 4 mg via INTRAVENOUS
  Filled 2022-08-20: qty 2

## 2022-08-20 MED ORDER — LACTATED RINGERS IV BOLUS: 1000.0000 mL | Freq: Three times a day (TID) | INTRAVENOUS | Status: DC | PRN

## 2022-08-20 MED ORDER — SODIUM CHLORIDE 0.9 % IV BOLUS
1000.0000 mL | Freq: Once | INTRAVENOUS | Status: AC
  Administered 2022-08-20: 1000 mL via INTRAVENOUS

## 2022-08-20 MED ORDER — TRAMADOL HCL 50 MG PO TABS
50.0000 mg | ORAL_TABLET | Freq: Four times a day (QID) | ORAL | Status: DC | PRN
  Administered 2022-08-21: 50 mg via ORAL
  Filled 2022-08-20: qty 1

## 2022-08-20 MED ORDER — HYDROMORPHONE HCL 1 MG/ML IJ SOLN
0.5000 mg | Freq: Once | INTRAMUSCULAR | Status: AC
  Administered 2022-08-20: 0.5 mg via INTRAVENOUS
  Filled 2022-08-20: qty 1

## 2022-08-20 MED ORDER — HYDROCODONE-ACETAMINOPHEN 7.5-325 MG PO TABS
1.0000 | ORAL_TABLET | Freq: Four times a day (QID) | ORAL | Status: DC | PRN
  Administered 2022-08-21 – 2022-08-22 (×3): 1 via ORAL
  Filled 2022-08-20 (×3): qty 1

## 2022-08-20 MED ORDER — METHOCARBAMOL 1000 MG/10ML IJ SOLN: 1000.0000 mg | Freq: Four times a day (QID) | INTRAVENOUS | Status: DC | PRN

## 2022-08-20 MED ORDER — DIPHENHYDRAMINE-APAP (SLEEP) 38-500 MG PO TABS: 1.0000 | ORAL_TABLET | Freq: Every evening | ORAL | Status: DC | PRN

## 2022-08-20 MED ORDER — MORPHINE SULFATE (PF) 2 MG/ML IV SOLN
2.0000 mg | INTRAVENOUS | Status: DC | PRN
  Administered 2022-08-20: 2 mg via INTRAVENOUS
  Filled 2022-08-20: qty 1

## 2022-08-20 MED ORDER — DORZOLAMIDE HCL-TIMOLOL MAL 2-0.5 % OP SOLN
1.0000 [drp] | Freq: Two times a day (BID) | OPHTHALMIC | Status: DC
  Administered 2022-08-20 – 2022-08-22 (×2): 1 [drp] via OPHTHALMIC
  Filled 2022-08-20 (×2): qty 10

## 2022-08-20 MED ORDER — LEVOCETIRIZINE DIHYDROCHLORIDE 5 MG PO TABS: 5.0000 mg | ORAL_TABLET | Freq: Every day | ORAL | Status: DC

## 2022-08-20 MED ORDER — ONDANSETRON HCL 4 MG/2ML IJ SOLN
4.0000 mg | Freq: Four times a day (QID) | INTRAMUSCULAR | Status: DC | PRN
  Administered 2022-08-20 – 2022-08-22 (×4): 4 mg via INTRAVENOUS
  Filled 2022-08-20 (×5): qty 2

## 2022-08-20 MED ORDER — METOPROLOL SUCCINATE ER 25 MG PO TB24
100.0000 mg | ORAL_TABLET | Freq: Every day | ORAL | Status: DC
  Administered 2022-08-21 – 2022-08-22 (×2): 100 mg via ORAL
  Filled 2022-08-20 (×2): qty 4

## 2022-08-20 MED ORDER — HYDROMORPHONE HCL 1 MG/ML IJ SOLN
0.5000 mg | INTRAMUSCULAR | Status: DC | PRN
  Administered 2022-08-20 – 2022-08-21 (×4): 1 mg via INTRAVENOUS
  Filled 2022-08-20 (×4): qty 1

## 2022-08-20 MED ORDER — ALUM & MAG HYDROXIDE-SIMETH 200-200-20 MG/5ML PO SUSP: 30.0000 mL | Freq: Four times a day (QID) | ORAL | Status: DC | PRN

## 2022-08-20 MED ORDER — DIPHENHYDRAMINE HCL 25 MG PO CAPS: 25.0000 mg | ORAL_CAPSULE | Freq: Every evening | ORAL | Status: DC | PRN

## 2022-08-20 MED ORDER — ATORVASTATIN CALCIUM 10 MG PO TABS
10.0000 mg | ORAL_TABLET | Freq: Every day | ORAL | Status: DC
  Administered 2022-08-20 – 2022-08-22 (×2): 10 mg via ORAL
  Filled 2022-08-20 (×3): qty 1

## 2022-08-20 MED ORDER — ONDANSETRON 4 MG PO TBDP: 4.0000 mg | ORAL_TABLET | Freq: Four times a day (QID) | ORAL | Status: DC | PRN

## 2022-08-20 MED ORDER — MENTHOL 3 MG MT LOZG: 1.0000 | LOZENGE | OROMUCOSAL | Status: DC | PRN

## 2022-08-20 MED ORDER — ACETAMINOPHEN 500 MG PO TABS
500.0000 mg | ORAL_TABLET | Freq: Three times a day (TID) | ORAL | Status: DC
  Administered 2022-08-20 – 2022-08-22 (×5): 500 mg via ORAL
  Filled 2022-08-20 (×5): qty 1

## 2022-08-20 MED ORDER — ENOXAPARIN SODIUM 40 MG/0.4ML IJ SOSY
40.0000 mg | PREFILLED_SYRINGE | INTRAMUSCULAR | Status: DC
  Administered 2022-08-20 – 2022-08-21 (×2): 40 mg via SUBCUTANEOUS
  Filled 2022-08-20 (×2): qty 0.4

## 2022-08-20 MED ORDER — SIMETHICONE 40 MG/0.6ML PO SUSP: 80.0000 mg | Freq: Four times a day (QID) | ORAL | Status: DC | PRN

## 2022-08-20 MED ORDER — METHOCARBAMOL 500 MG PO TABS
1000.0000 mg | ORAL_TABLET | Freq: Four times a day (QID) | ORAL | Status: DC | PRN
  Filled 2022-08-20: qty 2

## 2022-08-20 MED ORDER — ACETAMINOPHEN 500 MG PO TABS
500.0000 mg | ORAL_TABLET | Freq: Every evening | ORAL | Status: DC | PRN
  Administered 2022-08-22: 500 mg via ORAL

## 2022-08-20 MED ORDER — FLUTICASONE PROPIONATE 50 MCG/ACT NA SUSP: 2.0000 | Freq: Every day | NASAL | Status: DC | PRN

## 2022-08-20 MED ORDER — PHENOL 1.4 % MT LIQD: 2.0000 | OROMUCOSAL | Status: DC | PRN

## 2022-08-20 MED ORDER — BRIMONIDINE TARTRATE 0.2 % OP SOLN
1.0000 [drp] | Freq: Two times a day (BID) | OPHTHALMIC | Status: DC
  Administered 2022-08-20 – 2022-08-22 (×3): 1 [drp] via OPHTHALMIC
  Filled 2022-08-20: qty 5

## 2022-08-20 MED ORDER — LIP MEDEX EX OINT
TOPICAL_OINTMENT | Freq: Two times a day (BID) | CUTANEOUS | Status: DC
  Administered 2022-08-20 – 2022-08-22 (×3): 75 via TOPICAL
  Filled 2022-08-20: qty 7

## 2022-08-20 MED ORDER — MAGIC MOUTHWASH: 15.0000 mL | Freq: Four times a day (QID) | ORAL | Status: DC | PRN

## 2022-08-20 MED ORDER — PROCHLORPERAZINE EDISYLATE 10 MG/2ML IJ SOLN
5.0000 mg | INTRAMUSCULAR | Status: DC | PRN
  Administered 2022-08-20: 5 mg via INTRAVENOUS
  Administered 2022-08-21 (×3): 10 mg via INTRAVENOUS
  Filled 2022-08-20 (×4): qty 2

## 2022-08-20 MED ORDER — LACTATED RINGERS IV SOLN: INTRAVENOUS | Status: DC

## 2022-08-20 MED ORDER — BISACODYL 10 MG RE SUPP: 10.0000 mg | Freq: Two times a day (BID) | RECTAL | Status: DC | PRN

## 2022-08-20 MED ORDER — ACETAMINOPHEN 325 MG PO TABS: 650.0000 mg | ORAL_TABLET | Freq: Four times a day (QID) | ORAL | Status: DC | PRN

## 2022-08-20 NOTE — Telephone Encounter (Signed)
Jasmine Wilson called back wanting to know if there was any way to expedite the referral for surgery. After discussing with Kristine Garbe, advised that pt should be taken to ED to be evaluated for her pain and if needed, they will look into doing the surgery there. Advised to go to [facility] ED per ED surge info to avoid unnecessary wait and transport time coming to [facility] ED. Jasmine Wilson advised that she would take her now to get seen and expressed thanks for looking into this.

## 2022-08-20 NOTE — ED Triage Notes (Signed)
Seen on 2/28 and was dx with GallStones, no relief form pain med. Feels sick on her stomach.

## 2022-08-20 NOTE — H&P (Signed)
Admission Note  Jasmine Wilson 08/11/1940  [id number].    Requesting MD: Dr. Truett Mainland Chief Complaint/Reason for Consult: cholelithiasis  HPI:  82 y.o. female with medical history significant for HTN, HLD, glaucoma who presented to [facility] ED with abdominal pain. Pain began 2/28 and she described it as flank pain at that time. She had been evaluated by her PCP the day prior and started on abx for UTI. She presented to UC on 2/28 for the pain and was recommended to go to the ED. She underwent workup which was concerning for cholelithiasis and was discharged with pain medications and surgical referral. Since then pain has persisted and is now located in RUQ wrapping around into her flank and is not improved with pain medications. She also has nausea and states emesis x 1 overnight 2/2-2/29. She has not eaten much since 2/28 but has been tolerating fluids. She does not notice any change in her symptoms with PO intake but has had reduced appetite. Last oral intake protein shake this am. She denies fever/chills. She denies prior episodes of similar symptoms  She is accompanied by her daughter who assists with history.  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 Her smoking use included cigarettes. She has never used smokeless tobacco. She reports that she does not drink alcohol and does not use drugs.  Allergies:  Allergies  Allergen Reactions   Codeine Other (See Comments)    Made the patient "feel funny"    (Not in a hospital admission)   Blood pressure (!) 189/97, pulse 80, temperature 98.1 F (36.7 C), temperature source Oral, resp. rate 20, height '5\' 8"'$  (1.727 m), weight 83.9 kg, SpO2 97 %. Physical Exam: General: pleasant, WD, female who is laying in bed in NAD HEENT: head is normocephalic, atraumatic.  Sclera are noninjected.  Pupils equal and round. EOMs intact.  Ears and nose without any masses or lesions.  Mouth is pink and moist Lungs: Respiratory effort nonlabored on room air Abd: soft, NT, ND, no masses, hernias, or organomegaly. Focal TTP in RUQ with voluntary guarding MSK: all 4 extremities are symmetrical with no cyanosis, clubbing, or edema. Skin: warm and dry with no masses, lesions, or rashes  Neuro: Cranial nerves 2-12 grossly intact, sensation is normal throughout Psych: A&Ox3 with an appropriate affect.    Results for orders placed or performed during the hospital encounter of 08/20/22 (from the past 48 hour(s))  Comprehensive metabolic panel     Status: Abnormal   Collection Time: 08/20/22  1:24 PM  Result Value Ref Range   Sodium 131 (L) 135 - 145 mmol/L   Potassium 4.1 3.5 - 5.1 mmol/L   Chloride 99 98 - 111 mmol/L   CO2 23 22 - 32 mmol/L   Glucose, Bld 92 70 - 99 mg/dL    Comment: Glucose reference range applies only to samples taken after fasting for at least 8 hours.   BUN 14 8 - 23 mg/dL   Creatinine, Ser 0.85  0.44 - 1.00 mg/dL   Calcium 9.0 8.9 - 10.3 mg/dL   Total Protein 6.6 6.5 - 8.1 g/dL   Albumin 3.9 3.5 - 5.0 g/dL   AST 19 15 - 41 U/L   ALT 17 0 - 44 U/L   Alkaline Phosphatase 63 38 - 126 U/L   Total Bilirubin 0.9 0.3 - 1.2 mg/dL   GFR, Estimated >60 >60 mL/min    Comment: (NOTE) Calculated using the CKD-EPI Creatinine Equation (2021)    Anion gap 9 5 - 15    Comment: Performed at [facility], [place] [address] [place], [place] [address]  CBC with Differential     Status: None   Collection Time: 08/20/22  1:24 PM  Result Value Ref Range   WBC 9.7 4.0 - 10.5 K/uL   RBC 4.84 3.87 - 5.11 MIL/uL   Hemoglobin 14.2 12.0 - 15.0 g/dL   HCT 43.5 36.0 - 46.0 %   MCV 89.9 80.0 - 100.0 fL   MCH 29.3 26.0 - 34.0 pg   MCHC 32.6 30.0 - 36.0 g/dL   RDW 13.6 11.5 - 15.5 %   Platelets 253 150 - 400 K/uL   nRBC 0.0 0.0 - 0.2 %   Neutrophils Relative % 73 %   Neutro Abs 7.2 1.7 - 7.7 K/uL   Lymphocytes Relative 18 %   Lymphs Abs 1.7 0.7 - 4.0 K/uL   Monocytes Relative 7 %   Monocytes Absolute 0.6 0.1 - 1.0 K/uL   Eosinophils Relative 0 %   Eosinophils Absolute 0.0 0.0 - 0.5 K/uL   Basophils Relative 1 %   Basophils Absolute 0.1 0.0 - 0.1 K/uL   Immature Granulocytes 1 %   Abs Immature Granulocytes 0.06 0.00 - 0.07 K/uL    Comment: Performed at [facility], [place] [address] [place], [place] [address]  Lipase, blood     Status: None   Collection Time: 08/20/22  1:24 PM  Result Value Ref Range   Lipase 42 11 - 51 U/L    Comment: Performed at [facility], [place] [address], [place], [place] [address]  Urinalysis, Routine w reflex microscopic -Urine, Clean Catch     Status: Abnormal   Collection Time: 08/20/22  2:29 PM  Result Value Ref Range   Color, Urine YELLOW YELLOW   APPearance CLEAR CLEAR   Specific Gravity, Urine 1.030 1.005 - 1.030   pH 5.0 5.0 - 8.0   Glucose, UA NEGATIVE NEGATIVE mg/dL   Hgb urine dipstick NEGATIVE NEGATIVE    Bilirubin Urine NEGATIVE NEGATIVE   Ketones, ur 20 (A) NEGATIVE mg/dL   Protein, ur NEGATIVE NEGATIVE mg/dL   Nitrite NEGATIVE NEGATIVE   Leukocytes,Ua  NEGATIVE NEGATIVE    Comment: Performed at [place] [address], [place], [place] [address]   US Abdomen Limited RUQ (LIVER/GB)  Result Date: 08/20/2022 CLINICAL DATA:  [id number] Gallstone [id number] EXAM: ULTRASOUND ABDOMEN LIMITED RIGHT UPPER QUADRANT COMPARISON:  CT abdomen/pelvis August 18, 2022. FINDINGS: Limited study due to patient body habitus, bowel gas and movement. Within this limitation: Gallbladder: Approximately 1.5 cm gallstone. No wall thickening visualized. No sonographic Murphy sign noted by sonographer. Common bile duct: Diameter: 4.4 mm, within normal limits Liver: No focal lesion identified. Increased echogenicity. Portal vein is patent on color Doppler imaging with normal direction of blood flow towards the liver. IMPRESSION: 1. Cholelithiasis. 2. Hepatic steatosis. 3. Otherwise, no evidence of acute abnormality on this limited ultrasound. Electronically Signed   By: Margaretha Sheffield M.D.   On: 08/20/2022 13:17   CT Angio Chest/Abd/Pel for Dissection W and/or Wo Contrast  Result Date: 08/18/2022 CLINICAL DATA:  Acute aortic syndrome suspected EXAM: CT ANGIOGRAPHY CHEST, ABDOMEN AND PELVIS TECHNIQUE: Non-contrast CT of the chest was initially obtained. Multidetector CT imaging through the chest, abdomen and pelvis was performed using the standard protocol during bolus administration of intravenous contrast. Multiplanar reconstructed images and MIPs were obtained and reviewed to evaluate the vascular anatomy. RADIATION DOSE REDUCTION: This exam was performed according to the departmental dose-optimization program which includes automated exposure control, adjustment of the mA and/or kV according to patient size and/or use of iterative reconstruction technique. CONTRAST:  16m OMNIPAQUE IOHEXOL 350 MG/ML SOLN  COMPARISON:  None Available. FINDINGS: CTA CHEST FINDINGS Cardiovascular: Preferential opacification of the thoracic aorta. No evidence of thoracic aortic aneurysm or dissection. Normal heart size. No pericardial effusion. There are atherosclerotic calcifications of the aorta. Mediastinum/Nodes: There are numerous bilateral thyroid nodules. The largest is in the left lobe measuring 14 mm. There are no enlarged mediastinal or hilar lymph nodes. The esophagus is nondilated. There is a small hiatal hernia. Lungs/Pleura: There is a small amount of dependent atelectasis in the bilateral lower lobes. There is a small amount of atelectasis in the left lung base. The lungs are otherwise clear. There is no pleural effusion or pneumothorax. Musculoskeletal: No acute osseous findings. Review of the MIP images confirms the above findings. CTA ABDOMEN AND PELVIS FINDINGS VASCULAR Aorta: Normal caliber aorta without aneurysm, dissection, vasculitis or significant stenosis. There is mild calcified atherosclerotic disease throughout the aorta. Celiac: Patent without evidence of aneurysm, dissection, vasculitis or significant stenosis. SMA: Patent without evidence of aneurysm, dissection, vasculitis or significant stenosis. Renals: There is moderate focal stenosis at the origin of the right renal artery secondary to atherosclerotic disease. Left renal artery within normal limits. IMA: Patent without evidence of aneurysm, dissection, vasculitis or significant stenosis. Inflow: Patent without evidence of aneurysm, dissection, vasculitis or significant stenosis. Veins: No obvious venous abnormality within the limitations of this arterial phase study. Review of the MIP images confirms the above findings. NON-VASCULAR Hepatobiliary: Gallstones are present. There is no biliary ductal dilatation. The liver is within normal limits. Pancreas: Unremarkable. No pancreatic ductal dilatation or surrounding inflammatory changes. Spleen: Normal in  size without focal abnormality. Adrenals/Urinary Tract: There is no hydronephrosis or perinephric fluid. There is bilateral renal cortical scarring, left greater than right. There is a punctate nonobstructing right renal calculus. The adrenal glands the bladder are within normal limits. Stomach/Bowel: Stomach is within normal limits. Appendix appears normal. No evidence of bowel wall thickening, distention, or inflammatory changes. There is descending and sigmoid colon diverticulosis. Lymphatic: No  enlarged lymph nodes are seen. Reproductive: Uterus and bilateral adnexa are unremarkable. Other: There is no ascites. There is a small fat containing umbilical hernia. Musculoskeletal: No acute or significant osseous findings. Review of the MIP images confirms the above findings. IMPRESSION: 1. No evidence for aortic dissection or aneurysm. 2. 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 3. Cholelithiasis. 4. Nonobstructing right renal calculus. 5. Colonic diverticulosis. 6. 1.4 cm incidental thyroid nodule. No follow-up imaging is recommended. Reference: J Am Coll Radiol. 2015 Feb;12(2): 143-50 7.  Aortic Atherosclerosis (ICD10-I70.0). Electronically Signed   By: Ronney Asters M.D.   On: 08/18/2022 21:09      Assessment/Plan Biliary colic  Patient seen and examined and relevant labs and imaging reviewed. Work up and history consistent with biliary colic. Treatment options discussed and recommend definitive treatment with laparoscopic cholecystectomy  I have explained the procedure, risks, and aftercare of cholecystectomy.  Risks include but are not limited to bleeding, infection, wound problems, anesthesia, diarrhea, bile leak, injury to common bile duct/liver/intestine.  She seems to understand and agrees to proceed.  Admit to observation tonight and plan for lap chole tomorrow  FEN: CLD, NPO MN, LR @ 43m/hr ID: [email or web address] (surgical ppx and UTI) VTE: lovenox  I reviewed ED provider  notes, last 24 h vitals and pain scores, last 48 h intake and output, last 24 h labs and trends, and last 24 h imaging results.   MWinferd Humphrey [facility] 08/20/2022, 3:31 PM Please see Amion for pager number during day hours 7:00am-4:30pm

## 2022-08-20 NOTE — Telephone Encounter (Signed)
Called pt no answer phone just ring.  NVM to leave a message per Caleen Jobs, NP if she still  Having pain to try call [facility] [place] to see if can be seen sooner. Pt need to go ER to be seen.

## 2022-08-20 NOTE — Telephone Encounter (Signed)
Pts daughter was advised that the ER was the best option since they have access to IV meds and stronger meds and possible immediate surgery. Sissy says they did call the surgery office and all they said was that once they received a referral, they would be in contact with the pt within 5 days.   Pt declines going to the ER since the last time she went she waited over 6 hours. I did explain the dosing of the Norco, Ibu 200, and Tylenol 500. But let her know I would still send this message back to see what else you recommended since the pt was adamant about increasing the pain med dose and not going back to the ER.   Please advise.

## 2022-08-20 NOTE — Telephone Encounter (Signed)
Pt states the rx is called in to help with her gallbladder pain is not working. She seemed to be very uncomfortable on the phone, so transferred to triage for nurse eval.

## 2022-08-20 NOTE — ED Provider Notes (Signed)
[facility] Provider Note  CSN: [id number] Arrival date & time: 08/20/22 1153  Chief Complaint(s) Cholelithiasis  HPI Jasmine Wilson is a 82 y.o. female with history of hypertension presenting to the emergency department for right upper quadrant pain.  Patient reports severe pain.  She was seen here 2 days ago for abdominal pain, diagnosed with gallstones and UTI.  She was discharged on antibiotics for UTI as well as Norco and Zofran.  She reports her dysuria from the UTI has resolved but she is having persistent severe right upper quadrant pain and nausea and vomiting.  She has been unable to tolerate any p.o.  She has been compliant with her Norco as well as taking some Motrin and Tylenol without relief.  No hematemesis.  No melena, hematochezia, diarrhea.  No other abdominal pain.  No shortness of breath or chest pain.  She has a referral for surgery but no scheduled appointment.  Past Medical History Past Medical History:  Diagnosis Date   Abdominal pain 03/02/2011   Arthritis 03/02/2012   Bronchitis 04/04/2011   Cataract    Chicken pox as a child   Cough 01/29/2013   Female bladder prolapse, acquired 01/29/2011   Glaucoma 01/29/2011   Hearing aid worn    History of pneumonia    HTN (hypertension) 01/29/2011   Hyperlipidemia    Measles as a child   Meniere's disease    Pedal edema 04/01/2016   Preventative health care 01/29/2011   Shingles    Vaginosis 01/29/2013   Patient Active Problem List   Diagnosis Date Noted   Dysuria 08/17/2022   Acute cystitis with hematuria 03/09/2022   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[place]) 01/07/2022   Nonintractable headache 04/08/2019   Visual changes 04/08/2019   Abnormal hemoglobin (Hgb) ([place]) 04/01/2019   Right hip pain 10/10/2017   Environmental allergies 10/10/2017   Pedal edema 04/01/2016   Medicare annual wellness visit, subsequent 10/06/2014   Snoring 10/06/2014   Sun-damaged skin 10/06/2014    COVID 01/29/2013   Vaginosis 01/29/2013   Arthritis 03/02/2012   Abdominal pain 03/02/2011   Female bladder prolapse, acquired 01/29/2011   Preventative health care 01/29/2011   HTN (hypertension) 01/29/2011   Glaucoma 01/29/2011   Hyperlipidemia    Meniere's disease    Shingles    Hearing aid worn    Measles    Chicken pox    Home Medication(s) Prior to Admission medications   Medication Sig Start Date End Date Taking? Authorizing Provider  atorvastatin (LIPITOR) 10 MG tablet TAKE 1 TABLET BY MOUTH DAILY 06/24/22   [name], MD  brimonidine [facility]) 0.2 % ophthalmic solution SMARTSIG:1 Drop(s) In Eye(s) Every 12 Hours 01/20/22   [provider]  cephALEXin (KEFLEX) 500 MG capsule Take 1 capsule (500 mg total) by mouth 2 (two) times daily. 08/17/22   Debbrah Alar, NP  dorzolamide (TRUSOPT) 2 % ophthalmic solution Place 1 drop into both eyes 2 (two) times daily.  01/26/11   [provider]  fluticasone (FLONASE) 50 MCG/ACT nasal spray Place 2 sprays into both nostrils daily. 11/06/20   [name], MD  HYDROcodone-acetaminophen (NORCO) 7.5-325 MG tablet Take 1 tablet by mouth every 6 (six) hours as needed for up to 3 days for moderate pain. 08/19/22 08/22/22  Terrilyn Saver, NP  latanoprost (XALATAN) 0.005 % ophthalmic solution Place 1 drop into both eyes at bedtime.  11/30/10   [provider]  levocetirizine (XYZAL) 5 MG tablet  TAKE 1 TABLET BY MOUTH AT  BEDTIME AS NEEDED FOR ALLERGIES 11/17/21   [name], MD  metoprolol succinate (TOPROL-XL) 100 MG 24 hr tablet TAKE 1 TABLET BY MOUTH DAILY  TAKE WITH OR IMMEDIATELY  FOLLOWING A MEAL 06/24/22   [name], MD  ondansetron (ZOFRAN-ODT) 4 MG disintegrating tablet '4mg'$  ODT q4 hours prn nausea/vomit 08/18/22   Drenda Freeze, MD  timolol (BETIMOL) 0.5 % ophthalmic solution Place 1 drop into both eyes 2 (two) times daily.    [provider]                                                                                                                                     Past Surgical History Past Surgical History:  Procedure Laterality Date   BREAST BIOPSY Left    lump removed on left breast     clogged milk duct, benign   TUBAL LIGATION     Family History Family History  Problem Relation Age of Onset   Breast cancer Mother    Stroke Father    Heart disease Father    Hyperlipidemia Father    Hypertension Father    Breast cancer Sister 26   Uterine cancer Maternal Grandmother        uterus    Social History Social History   Tobacco Use   Smoking status: Former    Types: Cigarettes    Quit date: 06/21/1970    Years since quitting: 52.2   Smokeless tobacco: Never   Tobacco comments:    occasional smoker  Vaping Use   Vaping Use: Never used  Substance Use Topics   Alcohol use: No   Drug use: No   Allergies Codeine  Review of Systems Review of Systems  All other systems reviewed and are negative.   Physical Exam Vital Signs  I have reviewed the triage vital signs BP (!) 189/97 (BP Location: Right Arm)   Pulse 80   Temp 98.1 F (36.7 C) (Oral)   Resp 20   Ht '5\' 8"'$  (1.727 m)   Wt 83.9 kg   SpO2 97%   BMI 28.13 kg/m  Physical Exam Vitals and nursing note reviewed.  Constitutional:      General: She is in acute distress.     Appearance: She is well-developed.  HENT:     Head: Normocephalic and atraumatic.     Mouth/Throat:     Mouth: Mucous membranes are moist.  Eyes:     Pupils: Pupils are equal, round, and reactive to light.  Cardiovascular:     Rate and Rhythm: Normal rate and regular rhythm.     Heart sounds: No murmur heard. Pulmonary:     Effort: Pulmonary effort is normal. No respiratory distress.     Breath sounds: Normal breath sounds.  Abdominal:     General: Abdomen is flat.     Palpations: Abdomen is soft.  Tenderness: There is abdominal tenderness in the right upper quadrant. Positive signs include Murphy's sign.   Musculoskeletal:        General: No tenderness.     Right lower leg: No edema.     Left lower leg: No edema.  Skin:    General: Skin is warm and dry.  Neurological:     General: No focal deficit present.     Mental Status: She is alert. Mental status is at baseline.  Psychiatric:        Mood and Affect: Mood normal.        Behavior: Behavior normal.     ED Results and Treatments Labs (all labs ordered are listed, but only abnormal results are displayed) Labs Reviewed  COMPREHENSIVE METABOLIC PANEL - Abnormal; Notable for the following components:      Result Value   Sodium 131 (*)    All other components within normal limits  URINALYSIS, ROUTINE W REFLEX MICROSCOPIC - Abnormal; Notable for the following components:   Ketones, ur 20 (*)    All other components within normal limits  CBC WITH DIFFERENTIAL/PLATELET  LIPASE, BLOOD                                                                                                                          Radiology US Abdomen Limited RUQ (LIVER/GB)  Result Date: 08/20/2022 CLINICAL DATA:  [id number] Gallstone [id number] EXAM: ULTRASOUND ABDOMEN LIMITED RIGHT UPPER QUADRANT COMPARISON:  CT abdomen/pelvis August 18, 2022. FINDINGS: Limited study due to patient body habitus, bowel gas and movement. Within this limitation: Gallbladder: Approximately 1.5 cm gallstone. No wall thickening visualized. No sonographic Murphy sign noted by sonographer. Common bile duct: Diameter: 4.4 mm, within normal limits Liver: No focal lesion identified. Increased echogenicity. Portal vein is patent on color Doppler imaging with normal direction of blood flow towards the liver. IMPRESSION: 1. Cholelithiasis. 2. Hepatic steatosis. 3. Otherwise, no evidence of acute abnormality on this limited ultrasound. Electronically Signed   By: Margaretha Sheffield M.D.   On: 08/20/2022 13:17    Pertinent labs & imaging results that were available during my care of the patient were  reviewed by me and considered in my medical decision making (see MDM for details).  Medications Ordered in ED Medications  ondansetron (ZOFRAN) injection 4 mg (4 mg Intravenous Given 08/20/22 1319)  sodium chloride 0.9 % bolus 1,000 mL (1,000 mLs Intravenous New Bag/Given 08/20/22 1320)  HYDROmorphone (DILAUDID) injection 0.5 mg (0.5 mg Intravenous Given 08/20/22 1319)  HYDROmorphone (DILAUDID) injection 0.5 mg (0.5 mg Intravenous Given 08/20/22 1514)  ondansetron (ZOFRAN) injection 4 mg (4 mg Intravenous Given 08/20/22 1514)  Procedures Procedures  (including critical care time)  Medical Decision Making / ED Course   MDM:  82 year old female recently diagnosed with cholelithiasis presenting to the emergency department with severe right upper quadrant pain.  Patient uncomfortable appearing, but afebrile.  Hypertensive likely due to pain..  Suspect symptoms still due to ongoing cholelithiasis.  Patient is on antibiotics for her urinary infection.  Per culture data antibiotics are appropriate.  Will obtain right upper quadrant ultrasound.  No other abdominal tenderness to suggest alternative pathology and had recent imaging without other diagnoses such as appendicitis, obstruction, volvulus.  Will treat pain, give IV fluids and nausea medication and reassess.  Given patient's degree of pain may need surgical consult/admission since her symptoms were not controlled with outpatient management.  Clinical Course as of 08/20/22 1532  Fri Aug 20, 2022  1500 Discussed with Jana Half PA for general surgery. They will admit the patient for symptom control and plan to perform cholecystectomy. No evidence of infection on [place] or labs so will defer antibiotics. [WS]    Clinical Course User Index [WS] Cristie Hem, MD     Additional history obtained: -Additional history  obtained from family -External records from outside source obtained and reviewed including: Chart review including previous notes, labs, imaging, consultation notes including ED note from a few days ago   Lab Tests: -I ordered, reviewed, and interpreted labs.   The pertinent results include:   Labs Reviewed  COMPREHENSIVE METABOLIC PANEL - Abnormal; Notable for the following components:      Result Value   Sodium 131 (*)    All other components within normal limits  URINALYSIS, ROUTINE W REFLEX MICROSCOPIC - Abnormal; Notable for the following components:   Ketones, ur 20 (*)    All other components within normal limits  CBC WITH DIFFERENTIAL/PLATELET  LIPASE, BLOOD    Notable for hyponatremia, minimal. No leukocytosis    Imaging Studies ordered: I ordered imaging studies including RUQ [place] On my interpretation imaging demonstrates cholelithiasis without cholecystitis I independently visualized and interpreted imaging. I agree with the radiologist interpretation   Medicines ordered and prescription drug management: Meds ordered this encounter  Medications   ondansetron (ZOFRAN) injection 4 mg   sodium chloride 0.9 % bolus 1,000 mL   HYDROmorphone (DILAUDID) injection 0.5 mg   HYDROmorphone (DILAUDID) injection 0.5 mg   ondansetron (ZOFRAN) injection 4 mg    -I have reviewed the patients home medicines and have made adjustments as needed   Consultations Obtained: I requested consultation with the general surgeon team,  and discussed lab and imaging findings as well as pertinent plan - they recommend: admit to surgery for symptom control and likely cholecystectomy  Cardiac Monitoring: The patient was maintained on a cardiac monitor.  I personally viewed and interpreted the cardiac monitored which showed an underlying rhythm of: NSR  Social Determinants of Health:  Diagnosis or treatment significantly limited by social determinants of health: former  smoker   Reevaluation: After the interventions noted above, I reevaluated the patient and found that their symptoms have improved  Co morbidities that complicate the patient evaluation  Past Medical History:  Diagnosis Date   Abdominal pain 03/02/2011   Arthritis 03/02/2012   Bronchitis 04/04/2011   Cataract    Chicken pox as a child   Cough 01/29/2013   Female bladder prolapse, acquired 01/29/2011   Glaucoma 01/29/2011   Hearing aid worn    History of pneumonia    HTN (hypertension) 01/29/2011   Hyperlipidemia  Measles as a child   Meniere's disease    Pedal edema 04/01/2016   Preventative health care 01/29/2011   Shingles    Vaginosis 01/29/2013      Dispostion: Disposition decision including need for hospitalization was considered, and patient admitted to the hospital.    Final Clinical Impression(s) / ED Diagnoses Final diagnoses:  RUQ pain     This chart was dictated using voice recognition software.  Despite best efforts to proofread,  errors can occur which can change the documentation meaning.    Cristie Hem, MD 08/20/22 [phone]

## 2022-08-21 ENCOUNTER — Other Ambulatory Visit: Payer: Self-pay

## 2022-08-21 ENCOUNTER — Observation Stay (HOSPITAL_BASED_OUTPATIENT_CLINIC_OR_DEPARTMENT_OTHER): Payer: Medicare Other | Admitting: Certified Registered"

## 2022-08-21 ENCOUNTER — Observation Stay (HOSPITAL_COMMUNITY): Payer: Medicare Other | Admitting: Certified Registered"

## 2022-08-21 ENCOUNTER — Encounter (HOSPITAL_COMMUNITY)
Admission: EM | Disposition: A | Discharge status: Home / Self Care | Payer: Self-pay | Source: Home / Self Care | Attending: Emergency Medicine

## 2022-08-21 DIAGNOSIS — K8018 Calculus of gallbladder with other cholecystitis without obstruction: Secondary | ICD-10-CM

## 2022-08-21 DIAGNOSIS — K8010 Calculus of gallbladder with chronic cholecystitis without obstruction: Secondary | ICD-10-CM | POA: Diagnosis not present

## 2022-08-21 HISTORY — PX: CHOLECYSTECTOMY: SHX55

## 2022-08-21 LAB — SURGICAL PCR SCREEN
MRSA, PCR: NEGATIVE
Staphylococcus aureus: NEGATIVE

## 2022-08-21 LAB — URINE CULTURE: Culture: NO GROWTH

## 2022-08-21 SURGERY — LAPAROSCOPIC CHOLECYSTECTOMY WITH INTRAOPERATIVE CHOLANGIOGRAM
Anesthesia: General | Site: Abdomen

## 2022-08-21 MED ORDER — SUGAMMADEX SODIUM 200 MG/2ML IV SOLN
INTRAVENOUS | Status: DC | PRN
  Administered 2022-08-21: 180 mg via INTRAVENOUS

## 2022-08-21 MED ORDER — 0.9 % SODIUM CHLORIDE (POUR BTL) OPTIME
TOPICAL | Status: DC | PRN
  Administered 2022-08-21: 1000 mL

## 2022-08-21 MED ORDER — FENTANYL CITRATE (PF) 100 MCG/2ML IJ SOLN
INTRAMUSCULAR | Status: AC
  Filled 2022-08-21: qty 2

## 2022-08-21 MED ORDER — LACTATED RINGERS IR SOLN
Status: DC | PRN
  Administered 2022-08-21: 1000 mL

## 2022-08-21 MED ORDER — FENTANYL CITRATE PF 50 MCG/ML IJ SOSY: 25.0000 ug | PREFILLED_SYRINGE | INTRAMUSCULAR | Status: DC | PRN

## 2022-08-21 MED ORDER — PROPOFOL 10 MG/ML IV BOLUS
INTRAVENOUS | Status: DC | PRN
  Administered 2022-08-21: 150 mg via INTRAVENOUS

## 2022-08-21 MED ORDER — AMISULPRIDE (ANTIEMETIC) 5 MG/2ML IV SOLN: 10.0000 mg | Freq: Once | INTRAVENOUS | Status: DC | PRN

## 2022-08-21 MED ORDER — LIDOCAINE 2% (20 MG/ML) 5 ML SYRINGE
INTRAMUSCULAR | Status: DC | PRN
  Administered 2022-08-21: 60 mg via INTRAVENOUS

## 2022-08-21 MED ORDER — HYDROMORPHONE HCL 1 MG/ML IJ SOLN: 1.0000 mg | INTRAMUSCULAR | Status: DC | PRN

## 2022-08-21 MED ORDER — ROCURONIUM BROMIDE 10 MG/ML (PF) SYRINGE
PREFILLED_SYRINGE | INTRAVENOUS | Status: DC | PRN
  Administered 2022-08-21: 50 mg via INTRAVENOUS

## 2022-08-21 MED ORDER — ONDANSETRON HCL 4 MG/2ML IJ SOLN
INTRAMUSCULAR | Status: DC | PRN
  Administered 2022-08-21: 4 mg via INTRAVENOUS

## 2022-08-21 MED ORDER — BUPIVACAINE HCL 0.25 % IJ SOLN
INTRAMUSCULAR | Status: AC
  Filled 2022-08-21: qty 1

## 2022-08-21 MED ORDER — PHENYLEPHRINE HCL-NACL 20-0.9 MG/250ML-% IV SOLN
INTRAVENOUS | Status: DC | PRN
  Administered 2022-08-21: 70 ug/min via INTRAVENOUS

## 2022-08-21 MED ORDER — FENTANYL CITRATE (PF) 100 MCG/2ML IJ SOLN
INTRAMUSCULAR | Status: DC | PRN
  Administered 2022-08-21: 50 ug via INTRAVENOUS

## 2022-08-21 MED ORDER — DEXAMETHASONE SODIUM PHOSPHATE 10 MG/ML IJ SOLN
INTRAMUSCULAR | Status: DC | PRN
  Administered 2022-08-21: 4 mg via INTRAVENOUS

## 2022-08-21 MED ORDER — BUPIVACAINE-EPINEPHRINE 0.25% -1:200000 IJ SOLN
INTRAMUSCULAR | Status: DC | PRN
  Administered 2022-08-21: 20 mL

## 2022-08-21 SURGICAL SUPPLY — 41 items
APPLIER CLIP ROT 10 11.4 M/L (STAPLE) ×1
BENZOIN TINCTURE PRP APPL 2/3 (GAUZE/BANDAGES/DRESSINGS) ×1 IMPLANT
CABLE HIGH FREQUENCY MONO STRZ (ELECTRODE) ×1 IMPLANT
CHLORAPREP W/TINT 26 (MISCELLANEOUS) ×1 IMPLANT
CLIP APPLIE ROT 10 11.4 M/L (STAPLE) ×1 IMPLANT
CLSR STERI-STRIP ANTIMIC 1/2X4 (GAUZE/BANDAGES/DRESSINGS) IMPLANT
COVER MAYO STAND XLG (MISCELLANEOUS) ×1 IMPLANT
COVER SURGICAL LIGHT HANDLE (MISCELLANEOUS) ×1 IMPLANT
DRAPE C-ARM 42X120 X-RAY (DRAPES) ×1 IMPLANT
DRSG TEGADERM 2-3/8X2-3/4 SM (GAUZE/BANDAGES/DRESSINGS) ×3 IMPLANT
DRSG TEGADERM 4X4.75 (GAUZE/BANDAGES/DRESSINGS) ×1 IMPLANT
ELECT REM PT RETURN 15FT ADLT (MISCELLANEOUS) ×1 IMPLANT
GAUZE SPONGE 2X2 8PLY STRL LF (GAUZE/BANDAGES/DRESSINGS) IMPLANT
GAUZE SPONGE 2X2 STRL 8-PLY (GAUZE/BANDAGES/DRESSINGS) IMPLANT
GLOVE BIO SURGEON STRL SZ7 (GLOVE) ×1 IMPLANT
GLOVE BIOGEL PI IND STRL 7.5 (GLOVE) ×1 IMPLANT
GOWN STRL REUS W/ TWL LRG LVL3 (GOWN DISPOSABLE) ×1 IMPLANT
GOWN STRL REUS W/TWL LRG LVL3 (GOWN DISPOSABLE) ×1
HEMOSTAT SNOW SURGICEL 2X4 (HEMOSTASIS) IMPLANT
IRRIG SUCT STRYKERFLOW 2 WTIP (MISCELLANEOUS) ×1
IRRIGATION SUCT STRKRFLW 2 WTP (MISCELLANEOUS) ×1 IMPLANT
KIT BASIN OR (CUSTOM PROCEDURE TRAY) ×1 IMPLANT
KIT TURNOVER KIT A (KITS) IMPLANT
NS IRRIG 1000ML POUR BTL (IV SOLUTION) ×1 IMPLANT
POUCH RETRIEVAL ECOSAC 10 (ENDOMECHANICALS) IMPLANT
POUCH RETRIEVAL ECOSAC 10MM (ENDOMECHANICALS)
SCISSORS LAP 5X35 DISP (ENDOMECHANICALS) ×1 IMPLANT
SET CHOLANGIOGRAPH MIX (MISCELLANEOUS) ×1 IMPLANT
SET TUBE SMOKE EVAC HIGH FLOW (TUBING) ×1 IMPLANT
SLEEVE Z-THREAD 5X100MM (TROCAR) ×1 IMPLANT
SPIKE FLUID TRANSFER (MISCELLANEOUS) ×1 IMPLANT
STRIP CLOSURE SKIN 1/2X4 (GAUZE/BANDAGES/DRESSINGS) ×1 IMPLANT
SUT MNCRL AB 4-0 PS2 18 (SUTURE) ×1 IMPLANT
SYS BAG RETRIEVAL 10MM (BASKET)
SYSTEM BAG RETRIEVAL 10MM (BASKET) IMPLANT
TOWEL OR 17X26 10 PK STRL BLUE (TOWEL DISPOSABLE) ×1 IMPLANT
TOWEL OR NON WOVEN STRL DISP B (DISPOSABLE) ×1 IMPLANT
TRAY LAPAROSCOPIC (CUSTOM PROCEDURE TRAY) ×1 IMPLANT
TROCAR 11X100 Z THREAD (TROCAR) ×1 IMPLANT
TROCAR BALLN 12MMX100 BLUNT (TROCAR) ×1 IMPLANT
TROCAR Z-THREAD OPTICAL 5X100M (TROCAR) ×1 IMPLANT

## 2022-08-21 NOTE — Interval H&P Note (Signed)
History and Physical Interval Note:  08/21/2022 7:32 AM  Jasmine Wilson  has presented today for surgery, with the diagnosis of gallstones.  The various methods of treatment have been discussed with the patient and family. After consideration of risks, benefits and other options for treatment, the patient has consented to  Procedure(s): LAPAROSCOPIC CHOLECYSTECTOMY WITH INTRAOPERATIVE CHOLANGIOGRAM (N/A) as a surgical intervention.  The patient's history has been reviewed, patient examined, no change in status, stable for surgery.  I have reviewed the patient's chart and labs.  Questions were answered to the patient's satisfaction.     Maia Petties

## 2022-08-21 NOTE — Op Note (Signed)
Laparoscopic Cholecystectomy Procedure Note  Indications: This patient presents with symptomatic gallbladder disease refractory to medical management and will undergo urgent laparoscopic cholecystectomy.  Liver function tests were normal and US showed no sign of acute cholecystitis or choledocholithiasis.  Pre-operative Diagnosis: Calculus of gallbladder with other cholecystitis, without mention of obstruction  Post-operative Diagnosis: Same  Surgeon: Maia Petties   Assistants: none  Anesthesia: General endotracheal anesthesia  ASA Class: 2  Procedure Details  The patient was seen again in the Holding Room. The risks, benefits, complications, treatment options, and expected outcomes were discussed with the patient. The possibilities of reaction to medication, pulmonary aspiration, perforation of viscus, bleeding, recurrent infection, finding a normal gallbladder, the need for additional procedures, failure to diagnose a condition, the possible need to convert to an open procedure, and creating a complication requiring transfusion or operation were discussed with the patient. The likelihood of improving the patient's symptoms with return to their baseline status is good.  The patient and/or family concurred with the proposed plan, giving informed consent. The site of surgery properly noted. The patient was taken to Operating Room, identified as Jasmine Wilson and the procedure verified as Laparoscopic Cholecystectomy with Intraoperative Cholangiogram. A Time Out was held and the above information confirmed.  Prior to the induction of general anesthesia, antibiotic prophylaxis was administered. General endotracheal anesthesia was then administered and tolerated well. After the induction, the abdomen was prepped with Chloraprep and draped in sterile fashion. The patient was positioned in the supine position.  Local anesthetic agent was injected into the skin below the umbilicus and an incision  made. We dissected down to the abdominal fascia with blunt dissection.  The patient has a small umbilical hernia from a previous tubal ligation.  We dissected around the hernia sac down to the 1 cm fascial defect.  The hernia sac was amputated and  we entered the peritoneal cavity bluntly.  A pursestring suture of 0-Vicryl was placed around the fascial opening.  The Hasson cannula was inserted and secured with the stay suture.  Pneumoperitoneum was then created with CO2 and tolerated well without any adverse changes in the patient's vital signs. An 11-mm port was placed in the subxiphoid position.  Two 5-mm ports were placed in the right upper quadrant. All skin incisions were infiltrated with a local anesthetic agent before making the incision and placing the trocars.   We positioned the patient in reverse Trendelenburg, tilted slightly to the patient's left.  The gallbladder was identified, the fundus grasped and retracted cephalad. The gallbladder is quite distended and required decompression with the suction aspirator.  The gallbladder wall was not thickened, but there were omental adhesions.  Adhesions were lysed bluntly and with the electrocautery where indicated, taking care not to injure any adjacent organs or viscus. The infundibulum was grasped and retracted laterally, exposing the peritoneum overlying the triangle of Calot. This was then divided and exposed in a blunt fashion. The cystic duct was clearly identified and bluntly dissected circumferentially. A critical view of the cystic duct and cystic artery was obtained.  The cystic duct was then ligated with clips and divided. The cystic artery was, dissected free, ligated with clips and divided as well.   The gallbladder was dissected from the liver bed in retrograde fashion with the electrocautery. The gallbladder was removed and placed in an Eco sac. The liver bed was irrigated and inspected. Hemostasis was achieved with the electrocautery and  SNOW. Copious irrigation was utilized and was repeatedly  aspirated until clear.  The gallbladder and Eco sac were then removed through the umbilical port site.  The pursestring suture was used to close the umbilical fascia.    We again inspected the right upper quadrant for hemostasis.  Pneumoperitoneum was released as we removed the trocars.  4-0 Monocryl was used to close the skin.   Benzoin, steri-strips, and clean dressings were applied. The patient was then extubated and brought to the recovery room in stable condition. Instrument, sponge, and needle counts were correct at closure and at the conclusion of the case.   Findings: Cholecystitis with Cholelithiasis  Estimated Blood Loss: less than 50 mL         Drains: none         Specimens: Gallbladder           Complications: None; patient tolerated the procedure well.         Disposition: PACU - hemodynamically stable.         Condition: stable  Imogene Burn. Georgette Dover, MD, [facility] Surgery  General Surgery   08/21/2022 10:22 AM

## 2022-08-21 NOTE — Anesthesia Procedure Notes (Signed)
Procedure Name: Intubation Date/Time: 08/21/2022 9:18 AM  Performed by: Cleda Daub, CRNAPre-anesthesia Checklist: Patient identified, Emergency Drugs available, Suction available and Patient being monitored Patient Re-evaluated:Patient Re-evaluated prior to induction Oxygen Delivery Method: Circle system utilized Preoxygenation: Pre-oxygenation with 100% oxygen Induction Type: IV induction Ventilation: Mask ventilation without difficulty Laryngoscope Size: Mac and 3 Grade View: Grade II Tube type: Oral Number of attempts: 1 Airway Equipment and Method: Stylet and Oral airway Placement Confirmation: ETT inserted through vocal cords under direct vision, positive ETCO2 and breath sounds checked- equal and bilateral Secured at: 21 cm Tube secured with: Tape Dental Injury: Teeth and Oropharynx as per pre-operative assessment

## 2022-08-21 NOTE — Anesthesia Postprocedure Evaluation (Signed)
Anesthesia Post Note  Patient: Jasmine Wilson  Procedure(s) Performed: LAPAROSCOPIC CHOLECYSTECTOMY WITH INTRAOPERATIVE CHOLANGIOGRAM (Abdomen)     Patient location during evaluation: PACU Anesthesia Type: General Level of consciousness: awake and alert Pain management: pain level controlled Vital Signs Assessment: post-procedure vital signs reviewed and stable Respiratory status: spontaneous breathing, nonlabored ventilation, respiratory function stable and patient connected to nasal cannula oxygen Cardiovascular status: blood pressure returned to baseline and stable Postop Assessment: no apparent nausea or vomiting Anesthetic complications: no   No notable events documented.  Last Vitals:  Vitals:   08/21/22 1115 08/21/22 1139  BP: (!) 155/64 (!) 156/76  Pulse: 71 73  Resp: 10 16  Temp:  36.6 C  SpO2: 99% 97%    Last Pain:  Vitals:   08/21/22 1115  TempSrc:   PainSc: 2                  March Rummage Lichelle Viets

## 2022-08-21 NOTE — Plan of Care (Signed)
  Problem: Education: Goal: Knowledge of General Education information will improve Description: Including pain rating scale, medication(s)/side effects and non-pharmacologic comfort measures Outcome: Progressing   Problem: Health Behavior/Discharge Planning: Goal: Ability to manage health-related needs will improve Outcome: Progressing   Problem: Pain Managment: Goal: General experience of comfort will improve Outcome: Progressing   

## 2022-08-21 NOTE — Transfer of Care (Signed)
Immediate Anesthesia Transfer of Care Note  Patient: Jasmine Wilson  Procedure(s) Performed: LAPAROSCOPIC CHOLECYSTECTOMY WITH INTRAOPERATIVE CHOLANGIOGRAM (Abdomen)  Patient Location: PACU  Anesthesia Type:General  Level of Consciousness: awake, alert , oriented, and patient cooperative  Airway & Oxygen Therapy: Patient Spontanous Breathing and Patient connected to face mask oxygen  Post-op Assessment: Report given to RN and Post -op Vital signs reviewed and stable  Post vital signs: Reviewed and stable  Last Vitals:  Vitals Value Taken Time  BP 159/78 08/21/22 1030  Temp 36.7 C 08/21/22 1030  Pulse 72 08/21/22 1036  Resp 8 08/21/22 1036  SpO2 98 % 08/21/22 1036  Vitals shown include unvalidated device data.  Last Pain:  Vitals:   08/21/22 0818  TempSrc:   PainSc: 4       Patients Stated Pain Goal: 3 ([id number])  Complications: No notable events documented.

## 2022-08-21 NOTE — Anesthesia Preprocedure Evaluation (Signed)
Anesthesia Evaluation  Patient identified by MRN, date of birth, ID band Patient awake    Airway Mallampati: II  TM Distance: >3 FB Neck ROM: Full    Dental no notable dental hx.    Pulmonary neg pulmonary ROS, former smoker   Pulmonary exam normal        Cardiovascular hypertension, Pt. on medications and Pt. on home beta blockers  Rhythm:Regular Rate:Normal     Neuro/Psych  Headaches  negative psych ROS   GI/Hepatic Neg liver ROS,,,gallstones   Endo/Other  negative endocrine ROS    Renal/GU negative Renal ROS  negative genitourinary   Musculoskeletal  (+) Arthritis , Osteoarthritis,    Abdominal Normal abdominal exam  (+)   Peds  Hematology negative hematology ROS (+)   Anesthesia Other Findings   Reproductive/Obstetrics                             Anesthesia Physical Anesthesia Plan  ASA: 2  Anesthesia Plan: General   Post-op Pain Management: Tylenol PO (pre-op)*   Induction: Intravenous  PONV Risk Score and Plan: 3 and Ondansetron, Dexamethasone and Treatment may vary due to age or medical condition  Airway Management Planned: Mask and Oral ETT  Additional Equipment: None  Intra-op Plan:   Post-operative Plan: Extubation in OR  Informed Consent: I have reviewed the patients History and Physical, chart, labs and discussed the procedure including the risks, benefits and alternatives for the proposed anesthesia with the patient or authorized representative who has indicated his/her understanding and acceptance.     Dental advisory given  Plan Discussed with: CRNA  Anesthesia Plan Comments:        Anesthesia Quick Evaluation

## 2022-08-22 ENCOUNTER — Encounter (HOSPITAL_COMMUNITY): Payer: Self-pay | Admitting: Surgery

## 2022-08-22 NOTE — Discharge Instructions (Signed)
[facility] ______[facility] LAPAROSCOPIC SURGERY: POST OP INSTRUCTIONS Always review your discharge instruction sheet given to you by the facility where your surgery was performed. IF YOU HAVE DISABILITY OR FAMILY LEAVE FORMS, YOU MUST BRING THEM TO THE OFFICE FOR PROCESSING.   DO NOT GIVE THEM TO YOUR DOCTOR.  A prescription for pain medication may be given to you upon discharge.  Take your pain medication as prescribed, if needed.  If narcotic pain medicine is not needed, then you may take acetaminophen (Tylenol) or ibuprofen (Advil) as needed. Take your usually prescribed medications unless otherwise directed. If you need a refill on your pain medication, please contact your pharmacy.  They will contact our office to request authorization. Prescriptions will not be filled after 5pm or on week-ends. You should follow a light diet the first few days after arrival home, such as soup and crackers, etc.  Be sure to include lots of fluids daily. Most patients will experience some swelling and bruising in the area of the incisions.  Ice packs will help.  Swelling and bruising can take several days to resolve.  It is common to experience some constipation if taking pain medication after surgery.  Increasing fluid intake and taking a stool softener (such as Colace) will usually help or prevent this problem from occurring.  A mild laxative (Milk of Magnesia or Miralax) should be taken according to package instructions if there are no bowel movements after 48 hours. Unless discharge instructions indicate otherwise, you may remove your bandages 24-48 hours after surgery, and you may shower at that time.  You may have steri-strips (small skin tapes) in place directly over the incision.  These strips should be left on the skin for 7-10 days.  If your surgeon used skin glue on the incision, you may shower in 24 hours.  The glue will flake off over the next 2-3 weeks.  Any sutures or staples will be  removed at the office during your follow-up visit. ACTIVITIES:  You may resume regular (light) daily activities beginning the next day--such as daily self-care, walking, climbing stairs--gradually increasing activities as tolerated.  You may have sexual intercourse when it is comfortable.  Refrain from any heavy lifting or straining until approved by your doctor. You may drive when you are no longer taking prescription pain medication, you can comfortably wear a seatbelt, and you can safely maneuver your car and apply brakes. RETURN TO WORK:  __________________________________________________________ You should see your doctor in the office for a follow-up appointment approximately 2-3 weeks after your surgery.  Make sure that you call for this appointment within a day or two after you arrive home to insure a convenient appointment time. OTHER INSTRUCTIONS: __________________________________________________________________________________________________________________________ __________________________________________________________________________________________________________________________ WHEN TO CALL YOUR DOCTOR: Fever over 101.0 Inability to urinate Continued bleeding from incision. Increased pain, redness, or drainage from the incision. Increasing abdominal pain  The clinic staff is available to answer your questions during regular business hours.  Please don't hesitate to call and ask to speak to one of the nurses for clinical concerns.  If you have a medical emergency, go to the nearest emergency room or call 911.  A surgeon from [facility] is always on call at the hospital. [address], [address], [place], [place]  [address] ? [address] [address], [place], [place]   [address] [phone] ? [phone] ? FAX [phone] Web site: [email or web address]  

## 2022-08-22 NOTE — Discharge Summary (Signed)
Physician Discharge Summary  Patient ID: Jasmine Wilson MRN: [id number] DOB/AGE: 1940/10/27 82 y.o.  Admit date: 08/20/2022 Discharge date: 08/22/2022  Admission Diagnoses:  Acute calculus cholecystitis  Discharge Diagnoses: Same Principal Problem:   Symptomatic cholelithiasis   Discharged Condition: good  Hospital Course: Admitted 08/20/22 with persistent RUQ pain, cholelithiasis.  On 3/2, she underwent laparoscopic cholecystectomy.  She had a large gallstone in the neck of the gallbladder with edema of the gallbladder wall.  She felt much better on POD #1.  Tolerating diet with minimal nausea.   Treatments: surgery: laparoscopic cholecystectomy  Discharge Exam: Blood pressure (!) 158/69, pulse 67, temperature 97.7 F (36.5 C), resp. rate 14, height '5\' 8"'$  (1.727 m), weight 83.9 kg, SpO2 97 %. General appearance: alert, cooperative, and no distress GI: soft, incisional tenderness Dressings - some staining on umbilical incision; other incisions c/d/i  Disposition: Discharge disposition: 01-Home or Self Care       Discharge Instructions     Call MD for:  persistant nausea and vomiting   Complete by: As directed    Call MD for:  redness, tenderness, or signs of infection (pain, swelling, redness, odor or green/yellow discharge around incision site)   Complete by: As directed    Call MD for:  severe uncontrolled pain   Complete by: As directed    Call MD for:  temperature >100.4   Complete by: As directed    Diet general   Complete by: As directed    Driving Restrictions   Complete by: As directed    Do not drive while taking pain medications   Increase activity slowly   Complete by: As directed    May shower / Bathe   Complete by: As directed       Allergies as of 08/22/2022       Reactions   Tape Other (See Comments)   "Blisters the skin"- patient PREFERS COBAN WRAP   Codeine Other (See Comments)   Made the patient "feel funny"   Oxycodone Nausea And Vomiting         Medication List     TAKE these medications    acetaminophen 500 MG tablet Commonly known as: TYLENOL Take 500-1,000 mg by mouth every 6 (six) hours as needed for mild pain or headache.   atorvastatin 10 MG tablet Commonly known as: LIPITOR TAKE 1 TABLET BY MOUTH DAILY   bimatoprost 0.01 % Soln Commonly known as: LUMIGAN Place 1 drop into both eyes at bedtime.   brimonidine 0.2 % ophthalmic solution Commonly known as: ALPHAGAN Place 1 drop into the left eye every 12 (twelve) hours.   dorzolamide-timolol 2-0.5 % ophthalmic solution Commonly known as: COSOPT Place 1 drop into both eyes every 12 (twelve) hours.   Excedrin PM 38-500 MG Tabs Generic drug: diphenhydrAMINE-APAP (sleep) Take 1 tablet by mouth at bedtime as needed (for pain).   fluticasone 50 MCG/ACT nasal spray Commonly known as: FLONASE Place 2 sprays into both nostrils daily. What changed:  when to take this reasons to take this   HYDROcodone-acetaminophen 7.5-325 MG tablet Commonly known as: Norco Take 1 tablet by mouth every 6 (six) hours as needed for up to 3 days for moderate pain.   ibuprofen 200 MG tablet Commonly known as: ADVIL Take 200-400 mg by mouth every 6 (six) hours as needed for mild pain or headache.   levocetirizine 5 MG tablet Commonly known as: XYZAL TAKE 1 TABLET BY MOUTH AT  BEDTIME AS NEEDED FOR ALLERGIES What changed:  See the new instructions.   metoprolol succinate 100 MG 24 hr tablet Commonly known as: TOPROL-XL TAKE 1 TABLET BY MOUTH DAILY  TAKE WITH OR IMMEDIATELY  FOLLOWING A MEAL   ondansetron 4 MG disintegrating tablet Commonly known as: ZOFRAN-ODT '4mg'$  ODT q4 hours prn nausea/vomit What changed:  how much to take how to take this when to take this reasons to take this additional instructions   timolol 0.5 % ophthalmic solution Commonly known as: BETIMOL Place 1 drop into both eyes 2 (two) times daily.        Follow-up Information     Donnie Mesa,  MD Follow up in 3 week(s).   Specialty: General Surgery Contact information: [address] [place] [address] [phone]                 Signed: Maia Petties 08/22/2022, 7:31 AM

## 2022-08-23 NOTE — Telephone Encounter (Signed)
FYI - Pt called to notify Dr. Charlett Blake and Lovena Le that she went and her gal bladder removed and she is at home recovering.

## 2022-08-24 LAB — SURGICAL PATHOLOGY

## 2022-08-26 DIAGNOSIS — Z9049 Acquired absence of other specified parts of digestive tract: Secondary | ICD-10-CM | POA: Diagnosis not present

## 2022-08-30 ENCOUNTER — Other Ambulatory Visit: Payer: Self-pay | Admitting: Surgery

## 2022-08-30 DIAGNOSIS — N200 Calculus of kidney: Secondary | ICD-10-CM

## 2022-09-01 ENCOUNTER — Ambulatory Visit
Admission: RE | Admit: 2022-09-01 | Discharge: 2022-09-01 | Disposition: A | Discharge status: Home / Self Care | Payer: Medicare Other | Source: Ambulatory Visit | Attending: Surgery | Admitting: Surgery

## 2022-09-01 DIAGNOSIS — N200 Calculus of kidney: Secondary | ICD-10-CM | POA: Diagnosis not present

## 2022-09-01 DIAGNOSIS — K449 Diaphragmatic hernia without obstruction or gangrene: Secondary | ICD-10-CM | POA: Diagnosis not present

## 2022-09-01 DIAGNOSIS — K5730 Diverticulosis of large intestine without perforation or abscess without bleeding: Secondary | ICD-10-CM | POA: Diagnosis not present

## 2022-09-01 DIAGNOSIS — K6389 Other specified diseases of intestine: Secondary | ICD-10-CM | POA: Diagnosis not present

## 2022-09-02 ENCOUNTER — Telehealth: Payer: Self-pay

## 2022-09-02 NOTE — Telephone Encounter (Signed)
Done

## 2022-09-06 ENCOUNTER — Other Ambulatory Visit: Payer: Self-pay

## 2022-09-06 DIAGNOSIS — N3001 Acute cystitis with hematuria: Secondary | ICD-10-CM

## 2022-09-06 DIAGNOSIS — R1011 Right upper quadrant pain: Secondary | ICD-10-CM

## 2022-09-06 NOTE — Telephone Encounter (Signed)
Called pt was advised referral was placed and They office will contract her for appt time, Pt stated understand

## 2022-09-06 NOTE — Telephone Encounter (Signed)
Pt called to find out where she was referred but unable to locate urology referral in system. Pt said she is in pain. Please call to advise where the referral was sent as soon as possible.

## 2022-09-09 DIAGNOSIS — N200 Calculus of kidney: Secondary | ICD-10-CM | POA: Diagnosis not present

## 2022-09-20 DIAGNOSIS — E871 Hypo-osmolality and hyponatremia: Secondary | ICD-10-CM | POA: Insufficient documentation

## 2022-09-20 NOTE — Assessment & Plan Note (Signed)
Well controlled, no changes to meds. Encouraged heart healthy diet such as the DASH diet and exercise as tolerated.  °

## 2022-09-20 NOTE — Assessment & Plan Note (Addendum)
monitor with labs today, asymptomatic

## 2022-09-20 NOTE — Assessment & Plan Note (Signed)
Encourage heart healthy diet such as MIND or DASH diet, increase exercise, avoid trans fats, simple carbohydrates and processed foods, consider a krill or fish or flaxseed oil cap daily.  °

## 2022-09-21 ENCOUNTER — Ambulatory Visit (INDEPENDENT_AMBULATORY_CARE_PROVIDER_SITE_OTHER): Payer: Medicare Other | Admitting: Family Medicine

## 2022-09-21 VITALS — BP 138/70 | HR 95 | Temp 98.2°F | Resp 16 | Ht 68.0 in | Wt 179.2 lb

## 2022-09-21 DIAGNOSIS — E782 Mixed hyperlipidemia: Secondary | ICD-10-CM | POA: Diagnosis not present

## 2022-09-21 DIAGNOSIS — R109 Unspecified abdominal pain: Secondary | ICD-10-CM

## 2022-09-21 DIAGNOSIS — E871 Hypo-osmolality and hyponatremia: Secondary | ICD-10-CM

## 2022-09-21 DIAGNOSIS — I10 Essential (primary) hypertension: Secondary | ICD-10-CM | POA: Diagnosis not present

## 2022-09-21 DIAGNOSIS — R197 Diarrhea, unspecified: Secondary | ICD-10-CM | POA: Diagnosis not present

## 2022-09-21 LAB — CBC WITH DIFFERENTIAL/PLATELET
Basophils Absolute: 0.1 10*3/uL (ref 0.0–0.1)
Basophils Relative: 1 % (ref 0.0–3.0)
Eosinophils Absolute: 0.1 10*3/uL (ref 0.0–0.7)
Eosinophils Relative: 1.3 % (ref 0.0–5.0)
HCT: 43.6 % (ref 36.0–46.0)
Hemoglobin: 14.6 g/dL (ref 12.0–15.0)
Lymphocytes Relative: 17.8 % (ref 12.0–46.0)
Lymphs Abs: 1.6 10*3/uL (ref 0.7–4.0)
MCHC: 33.4 g/dL (ref 30.0–36.0)
MCV: 89.9 fl (ref 78.0–100.0)
Monocytes Absolute: 0.6 10*3/uL (ref 0.1–1.0)
Monocytes Relative: 6.5 % (ref 3.0–12.0)
Neutro Abs: 6.7 10*3/uL (ref 1.4–7.7)
Neutrophils Relative %: 73.4 % (ref 43.0–77.0)
Platelets: 319 10*3/uL (ref 150.0–400.0)
RBC: 4.85 Mil/uL (ref 3.87–5.11)
RDW: 14.5 % (ref 11.5–15.5)
WBC: 9.1 10*3/uL (ref 4.0–10.5)

## 2022-09-21 LAB — LIPID PANEL
Cholesterol: 233 mg/dL — ABNORMAL HIGH (ref 0–200)
HDL: 52.3 mg/dL (ref >39.00)
NonHDL: 180.66
Total CHOL/HDL Ratio: 4
Triglycerides: 305 mg/dL — ABNORMAL HIGH (ref 0.0–149.0)
VLDL: 61 mg/dL — ABNORMAL HIGH (ref 0.0–40.0)

## 2022-09-21 LAB — COMPREHENSIVE METABOLIC PANEL
ALT: 23 U/L (ref 0–35)
AST: 20 U/L (ref 0–37)
Albumin: 4.3 g/dL (ref 3.5–5.2)
Alkaline Phosphatase: 90 U/L (ref 39–117)
BUN: 17 mg/dL (ref 6–23)
CO2: 24 mEq/L (ref 19–32)
Calcium: 9.8 mg/dL (ref 8.4–10.5)
Chloride: 104 mEq/L (ref 96–112)
Creatinine, Ser: 0.71 mg/dL (ref 0.40–1.20)
GFR: 79.64 mL/min (ref >60.00)
Glucose, Bld: 110 mg/dL — ABNORMAL HIGH (ref 70–99)
Potassium: 4.2 mEq/L (ref 3.5–5.1)
Sodium: 136 mEq/L (ref 135–145)
Total Bilirubin: 0.5 mg/dL (ref 0.2–1.2)
Total Protein: 6.4 g/dL (ref 6.0–8.3)

## 2022-09-21 LAB — LDL CHOLESTEROL, DIRECT: Direct LDL: 113 mg/dL

## 2022-09-21 LAB — TSH: TSH: 1.15 u[IU]/mL (ref 0.35–5.50)

## 2022-09-21 NOTE — Patient Instructions (Signed)
Increase Benefiber to twice daily and after a week to 3 x a day if need be Consider PT before going to gym  Food Choices to Help Relieve Diarrhea, Adult Diarrhea can make you feel weak and cause you to become dehydrated. Dehydration is a condition in which there is not enough water or other fluids in the body. It is important to choose the right foods and drinks to: Relieve diarrhea. Replace lost fluids and nutrients. Prevent dehydration. What are tips for following this plan? Relieving diarrhea Avoid foods that make your diarrhea worse. These may include: Foods and drinks that are sweetened with high-fructose corn syrup, honey, or sweeteners such as xylitol, sorbitol, and mannitol. Check food labels for these ingredients. Fried, greasy, or spicy foods. Raw fruits and vegetables. Eat foods that are rich in probiotics. These include foods such as yogurt and fermented milk products. Probiotics can help increase healthy bacteria in your stomach and intestines (gastrointestinal or GI tract). This may help digestion and stop diarrhea. If you have lactose intolerance, avoid dairy products. These may make your diarrhea worse. Take medicine to help stop diarrhea only as told by your health care provider. Replacing nutrients  Eat bland, easy-to-digest foods in small amounts as you are able, until your diarrhea starts to get better. These foods include bananas, applesauce, rice, toast, and crackers. Over time, add nutrient-rich foods as your body tolerates them or as told by your health care provider. These include: Well-cooked protein foods, such as eggs, lean meats like fish or chicken without skin, and tofu. Peeled, seeded, and soft-cooked fruits and vegetables. Low-fat dairy products. Whole grains. Take vitamin and mineral supplements as told by your health care provider. Preventing dehydration  Start by sipping water or a solution to prevent dehydration (oral rehydration solution, or ORS).  This is a drink that helps replace fluids and minerals your body has lost. You can buy an ORS at pharmacies and retail stores. Try to drink at least 8-10 cups (2,000-2,500 mL) of fluid each day to help replace lost fluids. If your urine is pale yellow, you are getting enough fluids. You may drink other liquids in addition to water, such as fruit juice that you have added water to (diluted fruit juice) or low-calorie sports drinks, as tolerated or as told by your health care provider. Avoid drinks with caffeine, such as coffee, tea, or soft drinks. Avoid alcohol. This information is not intended to replace advice given to you by your health care provider. Make sure you discuss any questions you have with your health care provider. Document Revised: 11/24/2021 Document Reviewed: 11/24/2021 Elsevier Patient Education  [place].  Diarrhea, Adult Diarrhea is frequent loose and sometimes watery bowel movements. Diarrhea can make you feel weak and cause you to become dehydrated. Dehydration is a condition in which there is not enough water or other fluids in the body. Dehydration can make you tired and thirsty, cause you to have a dry mouth, and decrease how often you urinate. Diarrhea typically lasts 2-3 days. However, it can last longer if it is a sign of something more serious. It is important to treat your diarrhea as told by your health care provider. Follow these instructions at home: Eating and drinking     Follow these recommendations as told by your health care provider: Take an oral rehydration solution (ORS). This is an over-the-counter medicine that helps return your body to its normal balance of nutrients and water. It is found at pharmacies and retail  stores. Drink enough fluid to keep your urine pale yellow. Drink fluids such as water, diluted fruit juice, and low-calorie sports drinks. You can drink milk also, if desired. Sucking on ice chips is another way to get  fluids. Avoid drinking fluids that contain a lot of sugar or caffeine, such as soda, energy drinks, and regular sports drinks. Avoid alcohol. Eat bland, easy-to-digest foods in small amounts as you are able. These foods include bananas, applesauce, rice, lean meats, toast, and crackers. Avoid spicy or fatty foods.  Medicines Take over-the-counter and prescription medicines only as told by your health care provider. If you were prescribed antibiotics, take them as told by your health care provider. Do not stop using the antibiotic even if you start to feel better. General instructions  Wash your hands often using soap and water for at least 20 seconds. If soap and water are not available, use hand sanitizer. Others in the household should wash their hands as well. Hands should be washed: After using the toilet or changing a diaper. Before preparing, cooking, or serving food. While caring for a sick person or while visiting someone in a hospital. Rest at home while you recover. Take a warm bath to relieve any burning or pain from frequent diarrhea episodes. Watch your condition for any changes. Contact a health care provider if: You have a fever. Your diarrhea gets worse. You have new symptoms. You vomit every time you eat or drink. You feel light-headed, dizzy, or have a headache. You have muscle cramps. You have signs of dehydration, such as: Dark urine, very little urine, or no urine. Cracked lips. Dry mouth. Sunken eyes. Sleepiness. Weakness. You have bloody or black stools or stools that look like tar. You have severe pain, cramping, or bloating in your abdomen. Your skin feels cold and clammy. You feel confused. Get help right away if: You have chest pain or your heart is beating very quickly. You have trouble breathing or you are breathing very quickly. You feel extremely weak or you faint. These symptoms may be an emergency. Get help right away. Call 911. Do not wait to  see if the symptoms will go away. Do not drive yourself to the hospital. This information is not intended to replace advice given to you by your health care provider. Make sure you discuss any questions you have with your health care provider. Document Revised: 11/24/2021 Document Reviewed: 11/24/2021 Elsevier Patient Education  [place].

## 2022-09-21 NOTE — Progress Notes (Signed)
Subjective:   By signing my name below, I, [name], attest that this documentation has been prepared under the direction and in the presence of [name], MD., 09/21/2022.   Patient ID: Jasmine Wilson, female    DOB: July 05, 1940, 82 y.o.   MRN: [id number]  Chief Complaint  Patient presents with   Follow-up    Follow up   HPI Patient is in today for an office visit.  RUQ Pain Patient presents today with complaints of stomach sickness and diarrhea that started shortly after her laparoscopic cholecystectomy on 08/21/2022. Patient was admitted to the ED on 08/20/2022 with abdominal pain that radiated around to the right side of her back. CT scan revealed 1.5 cm gallstone and cholecystectomy was performed by Dr.Matthew Georgette Dover, MD., the next day. She states that her pain was 9/10 after surgery but today she rates it as 3/10. She was prescribed Oxycodone 5 mg to manage the pain but states this was making her drowsy. She currently is alternating between 2 Advil and 2 Tylenol. Additionally, she states that she is awakening with firm stool, but this becomes very loose throughout the day. Her naval region is bruised from the incision site, but this appears to be healing. She denies blood in the stool/fever/chills/chest pain/palpitations/headache.  Past Medical History:  Diagnosis Date   Abdominal pain 03/02/2011   Arthritis 03/02/2012   Bronchitis 04/04/2011   Cataract    Chicken pox as a child   Cough 01/29/2013   Female bladder prolapse, acquired 01/29/2011   Glaucoma 01/29/2011   Hearing aid worn    History of pneumonia    HTN (hypertension) 01/29/2011   Hyperlipidemia    Measles as a child   Meniere's disease    Pedal edema 04/01/2016   Preventative health care 01/29/2011   Shingles    Vaginosis 01/29/2013    Past Surgical History:  Procedure Laterality Date   BREAST BIOPSY Left    CHOLECYSTECTOMY N/A 08/21/2022   Procedure: LAPAROSCOPIC CHOLECYSTECTOMY;  Surgeon: Donnie Mesa,  MD;  Location: [facility];  Service: General;  Laterality: N/A;   lump removed on left breast     clogged milk duct, benign   TUBAL LIGATION      Family History  Problem Relation Age of Onset   Breast cancer Mother    Stroke Father    Heart disease Father    Hyperlipidemia Father    Hypertension Father    Breast cancer Sister 3   Uterine cancer Maternal Grandmother        uterus    Social History   Socioeconomic History   Marital status: Widowed    Spouse name: Not on file   Number of children: 3   Years of education: Not on file   Highest education level: Not on file  Occupational History   Occupation: Retired  Tobacco Use   Smoking status: Former    Types: Cigarettes    Quit date: 06/21/1970    Years since quitting: 52.2   Smokeless tobacco: Never   Tobacco comments:    occasional smoker  Vaping Use   Vaping Use: Never used  Substance and Sexual Activity   Alcohol use: No   Drug use: No   Sexual activity: Never  Other Topics Concern   Not on file  Social History Narrative   Not on file   Social Determinants of Health   Financial Resource Strain: Low Risk  (05/26/2022)   Overall Financial Resource Strain ([place])  Difficulty of Paying Living Expenses: Not hard at all  Food Insecurity: No Food Insecurity (08/20/2022)   Hunger Vital Sign    Worried About Running Out of Food in the Last Year: Never true    Ran Out of Food in the Last Year: Never true  Transportation Needs: No Transportation Needs (08/20/2022)   PRAPARE - Hydrologist (Medical): No    Lack of Transportation (Non-Medical): No  Physical Activity: Insufficiently Active (05/26/2022)   Exercise Vital Sign    Days of Exercise per Week: 2 days    Minutes of Exercise per Session: 60 min  Stress: No Stress Concern Present (05/26/2022)   [place]    Feeling of Stress : Not at all  Social Connections: Moderately  Integrated (05/26/2022)   Social Connection and Isolation Panel [NHANES]    Frequency of Communication with Friends and Family: More than three times a week    Frequency of Social Gatherings with Friends and Family: More than three times a week    Attends Religious Services: More than 4 times per year    Active Member of [employer] or Organizations: Yes    Attends Archivist Meetings: 1 to 4 times per year    Marital Status: Widowed  Intimate Partner Violence: Not At Risk (08/20/2022)   Humiliation, Afraid, Rape, and Kick questionnaire    Fear of Current or Ex-Partner: No    Emotionally Abused: No    Physically Abused: No    Sexually Abused: No    Outpatient Medications Prior to Visit  Medication Sig Dispense Refill   acetaminophen (TYLENOL) 500 MG tablet Take 500-1,000 mg by mouth every 6 (six) hours as needed for mild pain or headache.     atorvastatin (LIPITOR) 10 MG tablet TAKE 1 TABLET BY MOUTH DAILY 100 tablet 2   bimatoprost (LUMIGAN) 0.01 % SOLN Place 1 drop into both eyes at bedtime.     brimonidine (ALPHAGAN) 0.2 % ophthalmic solution Place 1 drop into the left eye every 12 (twelve) hours.     dorzolamide-timolol (COSOPT) 2-0.5 % ophthalmic solution Place 1 drop into both eyes every 12 (twelve) hours.     EXCEDRIN PM 500-38 MG TABS Take 1 tablet by mouth at bedtime as needed (for pain).     fluticasone (FLONASE) 50 MCG/ACT nasal spray Place 2 sprays into both nostrils daily. (Patient taking differently: Place 2 sprays into both nostrils daily as needed for allergies or rhinitis.) 48 g 1   ibuprofen (ADVIL) 200 MG tablet Take 200-400 mg by mouth every 6 (six) hours as needed for mild pain or headache.     levocetirizine (XYZAL) 5 MG tablet TAKE 1 TABLET BY MOUTH AT  BEDTIME AS NEEDED FOR ALLERGIES (Patient taking differently: Take 5 mg by mouth at bedtime.) 90 tablet 1   metoprolol succinate (TOPROL-XL) 100 MG 24 hr tablet TAKE 1 TABLET BY MOUTH DAILY  TAKE WITH OR IMMEDIATELY   FOLLOWING A MEAL 100 tablet 2   ondansetron (ZOFRAN-ODT) 4 MG disintegrating tablet 4mg  ODT q4 hours prn nausea/vomit (Patient taking differently: Take 4 mg by mouth every 4 (four) hours as needed for nausea or vomiting (dissolve orally).) 12 tablet 0   timolol (BETIMOL) 0.5 % ophthalmic solution Place 1 drop into both eyes 2 (two) times daily.     No facility-administered medications prior to visit.    Allergies  Allergen Reactions   Tape Other (See Comments)    "  Blisters the skin"- patient PREFERS COBAN WRAP   Codeine Other (See Comments)    Made the patient "feel funny"   Oxycodone Nausea And Vomiting    Review of Systems  Constitutional:  Negative for chills and fever.  Cardiovascular:  Negative for chest pain and palpitations.  Gastrointestinal:  Positive for abdominal pain (RUQ pain) and diarrhea.  Skin:           Neurological:  Negative for headaches.       Objective:    Physical Exam Constitutional:      General: She is not in acute distress.    Appearance: Normal appearance. She is normal weight. She is not ill-appearing.  HENT:     Head: Normocephalic and atraumatic.     Right Ear: External ear normal.     Left Ear: External ear normal.     Nose: Nose normal.     Mouth/Throat:     Mouth: Mucous membranes are moist.     Pharynx: Oropharynx is clear.  Eyes:     General:        Right eye: No discharge.        Left eye: No discharge.     Extraocular Movements: Extraocular movements intact.     Conjunctiva/sclera: Conjunctivae normal.     Pupils: Pupils are equal, round, and reactive to light.  Cardiovascular:     Rate and Rhythm: Normal rate and regular rhythm.     Pulses: Normal pulses.     Heart sounds: Normal heart sounds. No murmur heard.    No gallop.  Pulmonary:     Effort: Pulmonary effort is normal. No respiratory distress.     Breath sounds: Normal breath sounds. No wheezing or rales.  Abdominal:     General: Bowel sounds are normal.      Palpations: Abdomen is soft.     Tenderness: There is no abdominal tenderness. There is no guarding.     Comments: There is bruising in the naval region from where the incision was made for her laparoscopic cholecystectomy.  Musculoskeletal:        General: Normal range of motion.     Cervical back: Normal range of motion.     Right lower leg: No edema.     Left lower leg: No edema.  Skin:    General: Skin is warm and dry.  Neurological:     Mental Status: She is alert and oriented to person, place, and time.  Psychiatric:        Mood and Affect: Mood normal.        Behavior: Behavior normal.        Judgment: Judgment normal.     BP 138/70 (BP Location: Right Arm, Patient Position: Sitting, Cuff Size: Normal)   Pulse 95   Temp 98.2 F (36.8 C) (Oral)   Resp 16   Ht 5\' 8"  (1.727 m)   Wt 179 lb 3.2 oz (81.3 kg)   SpO2 96%   BMI 27.25 kg/m  Wt Readings from Last 3 Encounters:  09/21/22 179 lb 3.2 oz (81.3 kg)  08/20/22 185 lb (83.9 kg)  08/19/22 189 lb 12.8 oz (86.1 kg)    Diabetic Foot Exam - Simple   No data filed    Lab Results  Component Value Date   WBC 9.7 08/20/2022   HGB 14.2 08/20/2022   HCT 43.5 08/20/2022   PLT 253 08/20/2022   GLUCOSE 92 08/20/2022   CHOL 180 09/16/2021   TRIG 219.0 (  H) 09/16/2021   HDL 61.20 09/16/2021   LDLDIRECT 87.0 09/16/2021   LDLCALC 68 12/26/2019   ALT 17 08/20/2022   AST 19 08/20/2022   NA 131 (L) 08/20/2022   K 4.1 08/20/2022   CL 99 08/20/2022   CREATININE 0.85 08/20/2022   BUN 14 08/20/2022   CO2 23 08/20/2022   TSH 2.26 11/06/2020   HGBA1C 6.0 09/16/2021    Lab Results  Component Value Date   TSH 2.26 11/06/2020   Lab Results  Component Value Date   WBC 9.7 08/20/2022   HGB 14.2 08/20/2022   HCT 43.5 08/20/2022   MCV 89.9 08/20/2022   PLT 253 08/20/2022   Lab Results  Component Value Date   NA 131 (L) 08/20/2022   K 4.1 08/20/2022   CO2 23 08/20/2022   GLUCOSE 92 08/20/2022   BUN 14 08/20/2022    CREATININE 0.85 08/20/2022   BILITOT 0.9 08/20/2022   ALKPHOS 63 08/20/2022   AST 19 08/20/2022   ALT 17 08/20/2022   PROT 6.6 08/20/2022   ALBUMIN 3.9 08/20/2022   CALCIUM 9.0 08/20/2022   ANIONGAP 9 08/20/2022   GFR 66.50 09/16/2021   Lab Results  Component Value Date   CHOL 180 09/16/2021   Lab Results  Component Value Date   HDL 61.20 09/16/2021   Lab Results  Component Value Date   LDLCALC 68 12/26/2019   Lab Results  Component Value Date   TRIG 219.0 (H) 09/16/2021   Lab Results  Component Value Date   CHOLHDL 3 09/16/2021   Lab Results  Component Value Date   HGBA1C 6.0 09/16/2021      Assessment & Plan:  RUQ Pain: Stool kit ordered. Encouraged patient to increase Benefiber to twice daily and three times daily after one week if needed. Problem List Items Addressed This Visit     HTN (hypertension) - Primary    Well controlled, no changes to meds. Encouraged heart healthy diet such as the DASH diet and exercise as tolerated.        Relevant Orders   TSH   CBC w/Diff   Comp Met (CMET)   Hyperlipidemia    Encourage heart healthy diet such as MIND or DASH diet, increase exercise, avoid trans fats, simple carbohydrates and processed foods, consider a krill or fish or flaxseed oil cap daily.        Relevant Orders   Lipid panel   Hyponatremia    monitor with labs today, asymptomatic      Relevant Orders   Comp Met (CMET)   Right flank pain    Had her gallbladder out and initially her pain persisted at a high level so she saw the urologist for the kidney stone that was also present but urology does not believe the pain is caused by the stone and the pain is not colicky in nature at this time. It has improved to a steady 3 of 10 and is more manageable now. Likely pain will continue to improve post op but consideration also is given to a musculoskeletal cause of her pain, encouraged moist heat, ice, PT and massage therapy and report if worsens again       Other Visit Diagnoses     Diarrhea, unspecified type       Relevant Orders   TSH   CBC w/Diff   Clostridium Difficile by PCR([facility])   Stool Culture   Stool, WBC/Lactoferrin      No orders of the defined types were  placed in this encounter.  I, Penni Homans, MD, personally preformed the services described in this documentation.  All medical record entries made by the scribe were at my direction and in my presence.  I have reviewed the chart and discharge instructions (if applicable) and agree that the record reflects my personal performance and is accurate and complete. 09/21/2022  I,Mohammed Iqbal,acting as a scribe for Penni Homans, MD.,have documented all relevant documentation on the behalf of Penni Homans, MD,as directed by  Penni Homans, MD while in the presence of Penni Homans, MD.  Penni Homans, MD

## 2022-09-21 NOTE — Assessment & Plan Note (Signed)
Had her gallbladder out and initially her pain persisted at a high level so she saw the urologist for the kidney stone that was also present but urology does not believe the pain is caused by the stone and the pain is not colicky in nature at this time. It has improved to a steady 3 of 10 and is more manageable now. Likely pain will continue to improve post op but consideration also is given to a musculoskeletal cause of her pain, encouraged moist heat, ice, PT and massage therapy and report if worsens again

## 2022-09-22 ENCOUNTER — Other Ambulatory Visit: Payer: Self-pay

## 2022-09-22 MED ORDER — ATORVASTATIN CALCIUM 20 MG PO TABS: 20.0000 mg | ORAL_TABLET | Freq: Every day | ORAL | 3 refills | Status: DC

## 2022-09-27 ENCOUNTER — Other Ambulatory Visit: Payer: Medicare Other

## 2022-09-27 ENCOUNTER — Telehealth: Payer: Self-pay | Admitting: *Deleted

## 2022-09-27 DIAGNOSIS — R197 Diarrhea, unspecified: Secondary | ICD-10-CM | POA: Diagnosis not present

## 2022-09-27 NOTE — Telephone Encounter (Signed)
Pt returned stool specimen and had gotten confused and returned 2 urine specimens.  Pt states she is no longer having diarrhea, stools are formed.  The lab will reject the C. Diff if the stool is formed (must be soft, watery stool).  The stool wbc may be from formed stool.  Do you still want pt pick up another kit to complete wbc test? We will cancel C. Diff since diarrhea is resolved.  Please advise?

## 2022-09-27 NOTE — Progress Notes (Signed)
Samples were dropped off.  Samples were supposed to have been stool. We received 1 stool sample and 2 urine specimens. Contacted pt. She states she misunderstood and thought she was supposed to return urine in the white cap cups.  Pt states she is no longer having diarrhea, stools are formed. Will send message to PCP to see if pt still needs to collect the other 2 specimens as C. Diff testing will be rejected by lab if stool is formed. See phone note.

## 2022-09-30 NOTE — Telephone Encounter (Signed)
Left message for pt to return my call.

## 2022-09-30 NOTE — Telephone Encounter (Signed)
Pt returned my call and was notified of below.

## 2022-10-01 LAB — STOOL CULTURE: E coli, Shiga toxin Assay: NEGATIVE

## 2022-11-04 ENCOUNTER — Encounter: Payer: Medicare Other | Admitting: Family Medicine

## 2022-11-09 ENCOUNTER — Ambulatory Visit (INDEPENDENT_AMBULATORY_CARE_PROVIDER_SITE_OTHER): Payer: Medicare Other | Admitting: Family Medicine

## 2022-11-09 ENCOUNTER — Encounter: Payer: Self-pay | Admitting: Family Medicine

## 2022-11-09 VITALS — BP 112/60 | HR 85 | Ht 68.0 in | Wt 180.0 lb

## 2022-11-09 DIAGNOSIS — Z0001 Encounter for general adult medical examination with abnormal findings: Secondary | ICD-10-CM | POA: Diagnosis not present

## 2022-11-09 DIAGNOSIS — I10 Essential (primary) hypertension: Secondary | ICD-10-CM | POA: Diagnosis not present

## 2022-11-09 DIAGNOSIS — R21 Rash and other nonspecific skin eruption: Secondary | ICD-10-CM

## 2022-11-09 LAB — COMPREHENSIVE METABOLIC PANEL
ALT: 11 U/L (ref 0–35)
AST: 15 U/L (ref 0–37)
Albumin: 4.1 g/dL (ref 3.5–5.2)
Alkaline Phosphatase: 91 U/L (ref 39–117)
BUN: 13 mg/dL (ref 6–23)
CO2: 25 mEq/L (ref 19–32)
Calcium: 9.3 mg/dL (ref 8.4–10.5)
Chloride: 105 mEq/L (ref 96–112)
Creatinine, Ser: 0.7 mg/dL (ref 0.40–1.20)
GFR: 80.94 mL/min (ref >60.00)
Glucose, Bld: 89 mg/dL (ref 70–99)
Potassium: 4.5 mEq/L (ref 3.5–5.1)
Sodium: 139 mEq/L (ref 135–145)
Total Bilirubin: 0.5 mg/dL (ref 0.2–1.2)
Total Protein: 6.1 g/dL (ref 6.0–8.3)

## 2022-11-09 MED ORDER — NYSTATIN 100000 UNIT/GM EX POWD: 1.0000 | Freq: Three times a day (TID) | CUTANEOUS | 0 refills | Status: DC

## 2022-11-09 MED ORDER — METOPROLOL SUCCINATE ER 100 MG PO TB24: 100.0000 mg | ORAL_TABLET | Freq: Every day | ORAL | 2 refills | Status: DC

## 2022-11-09 NOTE — Patient Instructions (Signed)
Let's try some nystatin powder for the rash under your arms to see if it will respond like a yeast infection.  Please contact office for follow-up if symptoms do not improve or worsen. Seek emergency care if symptoms become severe.

## 2022-11-09 NOTE — Progress Notes (Signed)
Complete physical exam  Patient: Jasmine Wilson   DOB: July 27, 1940   82 y.o. Female  MRN: [id number]  Subjective:    Chief Complaint  Patient presents with   Annual Exam    Jasmine Wilson is a 82 y.o. female who presents today for a complete physical exam. She reports consuming a  general, cautious about trigger foods for recent cholecystectomy   diet.  She is going to silver sneakers 1-2 days per week  She generally feels well. She reports sleeping well. She does not have additional problems to discuss today.   Currently lives with: alone Acute concerns or interim problems since last visit:  - Rash: for the past several days she has noticed an itchy, red rash in her axillary region bilaterally. States she has tried hydrocortisone cream but that was not very helpful. No pain, spreading, or drainage.    Vision concerns: no concerns Dental concerns: no concerns     Most recent fall risk assessment:    11/09/2022   12:21 PM  Fall Risk   Falls in the past year? 0  Number falls in past yr: 0  Injury with Fall? 0  Risk for fall due to : No Fall Risks  Follow up Falls evaluation completed     Most recent depression screenings:    09/21/2022   11:46 AM 05/26/2022    2:09 PM  PHQ 2/9 Scores  PHQ - 2 Score 0 0  PHQ- 9 Score 0             Patient Care Team: Bradd Canary, MD as PCP - General (Family Medicine) Fredrich Birks, OD as Referring Physician (Optometry)   Outpatient Medications Prior to Visit  Medication Sig   atorvastatin (LIPITOR) 20 MG tablet Take 1 tablet (20 mg total) by mouth daily.   bimatoprost (LUMIGAN) 0.01 % SOLN Place 1 drop into both eyes at bedtime.   brimonidine (ALPHAGAN) 0.2 % ophthalmic solution Place 1 drop into the left eye every 12 (twelve) hours.   dorzolamide-timolol (COSOPT) 2-0.5 % ophthalmic solution Place 1 drop into both eyes every 12 (twelve) hours.   levocetirizine (XYZAL) 5 MG tablet TAKE 1 TABLET BY MOUTH AT  BEDTIME AS NEEDED FOR  ALLERGIES (Patient taking differently: Take 5 mg by mouth at bedtime.)   timolol (BETIMOL) 0.5 % ophthalmic solution Place 1 drop into both eyes 2 (two) times daily.   [DISCONTINUED] metoprolol succinate (TOPROL-XL) 100 MG 24 hr tablet TAKE 1 TABLET BY MOUTH DAILY  TAKE WITH OR IMMEDIATELY  FOLLOWING A MEAL   [DISCONTINUED] ondansetron (ZOFRAN-ODT) 4 MG disintegrating tablet 4mg  ODT q4 hours prn nausea/vomit (Patient taking differently: Take 4 mg by mouth every 4 (four) hours as needed for nausea or vomiting (dissolve orally).)   [DISCONTINUED] acetaminophen (TYLENOL) 500 MG tablet Take 500-1,000 mg by mouth every 6 (six) hours as needed for mild pain or headache.   [DISCONTINUED] atorvastatin (LIPITOR) 10 MG tablet TAKE 1 TABLET BY MOUTH DAILY   [DISCONTINUED] EXCEDRIN PM 500-38 MG TABS Take 1 tablet by mouth at bedtime as needed (for pain).   [DISCONTINUED] fluticasone (FLONASE) 50 MCG/ACT nasal spray Place 2 sprays into both nostrils daily. (Patient taking differently: Place 2 sprays into both nostrils daily as needed for allergies or rhinitis.)   [DISCONTINUED] ibuprofen (ADVIL) 200 MG tablet Take 200-400 mg by mouth every 6 (six) hours as needed for mild pain or headache.   No facility-administered medications prior to visit.    ROS  All review of systems negative except what is listed in the HPI        Objective:     BP 112/60   Pulse 85   Ht 5\' 8"  (1.727 m)   Wt 180 lb (81.6 kg)   SpO2 97%   BMI 27.37 kg/m    Physical Exam Vitals reviewed.  Constitutional:      General: She is not in acute distress.    Appearance: Normal appearance. She is not ill-appearing.  HENT:     Head: Normocephalic and atraumatic.     Right Ear: Tympanic membrane normal.     Left Ear: Tympanic membrane normal.     Nose: Nose normal.     Mouth/Throat:     Mouth: Mucous membranes are moist.     Pharynx: Oropharynx is clear.  Eyes:     Extraocular Movements: Extraocular movements intact.      Conjunctiva/sclera: Conjunctivae normal.     Pupils: Pupils are equal, round, and reactive to light.  Neck:     Vascular: No carotid bruit.  Cardiovascular:     Rate and Rhythm: Normal rate and regular rhythm.     Pulses: Normal pulses.     Heart sounds: Normal heart sounds.  Pulmonary:     Effort: Pulmonary effort is normal.     Breath sounds: Normal breath sounds.  Abdominal:     General: Abdomen is flat. Bowel sounds are normal. There is no distension.     Palpations: Abdomen is soft. There is no mass.     Tenderness: There is no abdominal tenderness. There is no right CVA tenderness, left CVA tenderness, guarding or rebound.     Hernia: No hernia is present.  Genitourinary:    Comments: Deferred exam Musculoskeletal:        General: Normal range of motion.     Cervical back: Normal range of motion and neck supple. No tenderness.     Right lower leg: No edema.     Left lower leg: No edema.  Lymphadenopathy:     Cervical: No cervical adenopathy.  Skin:    General: Skin is warm and dry.     Capillary Refill: Capillary refill takes less than 2 seconds.     Comments: Mild erythematous rash to both axillary regions  Neurological:     General: No focal deficit present.     Mental Status: She is alert and oriented to person, place, and time. Mental status is at baseline.  Psychiatric:        Mood and Affect: Mood normal.        Behavior: Behavior normal.        Thought Content: Thought content normal.        Judgment: Judgment normal.      No results found for any visits on 11/09/22.     Assessment & Plan:    Routine Health Maintenance and Physical Exam Discussed health promotion and safety including diet and exercise recommendations, dental health, and injury prevention. Tobacco cessation if applicable. Seat belts, sunscreen, smoke detectors, etc.    Immunization History  Administered Date(s) Administered   PFIZER(Purple Top)SARS-COV-2 Vaccination 07/27/2019,  08/17/2019, 04/01/2020   Pneumococcal Conjugate-13 05/06/2015   Pneumococcal Polysaccharide-23 04/03/2018   Tdap 02/24/2012    Health Maintenance  Topic Date Due   Zoster Vaccines- Shingrix (1 of 2) Never done   DTaP/Tdap/Td (2 - Td or Tdap) 02/23/2022   COVID-19 Vaccine (4 - 2023-24 season) 01/14/2023 (Originally 02/19/2022)   DEXA  SCAN  05/27/2023 (Originally 02/16/2006)   INFLUENZA VACCINE  01/20/2023   Medicare Annual Wellness (AWV)  05/27/2023   Pneumonia Vaccine 57+ Years old  Completed   HPV VACCINES  Aged Out        Problem List Items Addressed This Visit     HTN (hypertension) Stable. Well controlled. Continue current regimen and lifestyle measures. Refilling metoprolol.    Relevant Medications   metoprolol succinate (TOPROL-XL) 100 MG 24 hr tablet   Other Relevant Orders   Comprehensive metabolic panel   Other Visit Diagnoses     Rash    -  Primary Let's try some nystatin powder for the rash under your arms to see if it will respond like a yeast infection.  Please contact office for follow-up if symptoms do not improve or worsen. Seek emergency care if symptoms become severe.   Relevant Medications   nystatin (MYCOSTATIN/NYSTOP) powder   Encounter for routine adult health examination with abnormal findings          Return in about 3 months (around 02/09/2023) for routine follow-up.     Clayborne Dana, NP

## 2022-11-29 DIAGNOSIS — H903 Sensorineural hearing loss, bilateral: Secondary | ICD-10-CM | POA: Diagnosis not present

## 2022-12-16 DIAGNOSIS — H401131 Primary open-angle glaucoma, bilateral, mild stage: Secondary | ICD-10-CM | POA: Diagnosis not present

## 2022-12-27 DIAGNOSIS — N200 Calculus of kidney: Secondary | ICD-10-CM | POA: Diagnosis not present

## 2023-02-14 NOTE — Progress Notes (Unsigned)
      Established patient visit   Patient: Jasmine Wilson   DOB: [birth year]   82 y.o. Female  MRN: [id number] Visit Date: 02/15/2023  Today's healthcare provider: Alfredia Ferguson, PA-C   No chief complaint on file.  Subjective    HPI  ***  Medications: Outpatient Medications Prior to Visit  Medication Sig   atorvastatin (LIPITOR) 20 MG tablet Take 1 tablet (20 mg total) by mouth daily.   bimatoprost (LUMIGAN) 0.01 % SOLN Place 1 drop into both eyes at bedtime.   brimonidine (ALPHAGAN) 0.2 % ophthalmic solution Place 1 drop into the left eye every 12 (twelve) hours.   dorzolamide-timolol (COSOPT) 2-0.5 % ophthalmic solution Place 1 drop into both eyes every 12 (twelve) hours.   levocetirizine (XYZAL) 5 MG tablet TAKE 1 TABLET BY MOUTH AT  BEDTIME AS NEEDED FOR ALLERGIES (Patient taking differently: Take 5 mg by mouth at bedtime.)   metoprolol succinate (TOPROL-XL) 100 MG 24 hr tablet Take 1 tablet (100 mg total) by mouth daily. TAKE WITH OR IMMEDIATELY FOLLOWING A MEAL.   nystatin (MYCOSTATIN/NYSTOP) powder Apply 1 Application topically 3 (three) times daily.   timolol (BETIMOL) 0.5 % ophthalmic solution Place 1 drop into both eyes 2 (two) times daily.   No facility-administered medications prior to visit.    Review of Systems {Insert previous labs (optional):23779} {See past labs  Heme  Chem  Endocrine  Serology  Results Review (optional):1}   Objective    There were no vitals taken for this visit. {Insert last BP/Wt (optional):23777}{See vitals history (optional):1}  Physical Exam  ***  No results found for any visits on 02/15/23.  Assessment & Plan     ***  No follow-ups on file.      {provider attestation***:1}   Alfredia Ferguson, PA-C  [facility] at [facility] [phone] (phone) [phone] (fax)  [facility]

## 2023-02-15 ENCOUNTER — Encounter: Payer: Self-pay | Admitting: Physician Assistant

## 2023-02-15 ENCOUNTER — Ambulatory Visit (INDEPENDENT_AMBULATORY_CARE_PROVIDER_SITE_OTHER): Payer: Medicare Other | Admitting: Physician Assistant

## 2023-02-15 VITALS — BP 124/82 | HR 87 | Temp 97.7°F | Resp 20 | Ht 68.0 in | Wt 181.0 lb

## 2023-02-15 DIAGNOSIS — R300 Dysuria: Secondary | ICD-10-CM | POA: Diagnosis not present

## 2023-02-15 DIAGNOSIS — N3000 Acute cystitis without hematuria: Secondary | ICD-10-CM

## 2023-02-15 LAB — POCT URINALYSIS DIP (MANUAL ENTRY)
Bilirubin, UA: NEGATIVE
Blood, UA: NEGATIVE
Glucose, UA: NEGATIVE mg/dL
Ketones, POC UA: NEGATIVE mg/dL
Nitrite, UA: NEGATIVE
Protein Ur, POC: NEGATIVE mg/dL
Spec Grav, UA: 1.01 (ref 1.010–1.025)
Urobilinogen, UA: 0.2 E.U./dL
pH, UA: 5 (ref 5.0–8.0)

## 2023-02-15 MED ORDER — SULFAMETHOXAZOLE-TRIMETHOPRIM 800-160 MG PO TABS: 1.0000 | ORAL_TABLET | Freq: Two times a day (BID) | ORAL | 0 refills | Status: AC

## 2023-02-18 LAB — URINE CULTURE
MICRO NUMBER:: 15387683
SPECIMEN QUALITY:: ADEQUATE

## 2023-03-01 ENCOUNTER — Other Ambulatory Visit: Payer: Medicare Other

## 2023-03-01 ENCOUNTER — Telehealth: Payer: Self-pay | Admitting: Family Medicine

## 2023-03-01 NOTE — Telephone Encounter (Signed)
**  Pt called stating that her UTI seems to be coming back after she had finished her meds. Pt would like to have another round sent in to knock this out.**  Prescription Request  03/01/2023  Is this a "Controlled Substance" medicine? No  LOV: 09/21/2022  What is the name of the medication or equipment?   sulfamethoxazole-trimethoprim (BACTRIM DS) 800-160 MG tablet [[id number]]  ENDED  Have you contacted your pharmacy to request a refill? No   Which pharmacy would you like this sent to?  [id number] [place], [place] - [address] [place] [address] Phone: [phone] Fax: [phone]    Patient notified that their request is being sent to the clinical staff for review and that they should receive a response within 2 business days.   Please advise at Mobile [phone] (mobile)

## 2023-03-02 ENCOUNTER — Other Ambulatory Visit: Payer: Medicare Other

## 2023-03-02 DIAGNOSIS — Z87440 Personal history of urinary (tract) infections: Secondary | ICD-10-CM | POA: Diagnosis not present

## 2023-03-02 DIAGNOSIS — R300 Dysuria: Secondary | ICD-10-CM

## 2023-03-05 LAB — URINE CULTURE
MICRO NUMBER:: 15451785
SPECIMEN QUALITY:: ADEQUATE

## 2023-03-07 ENCOUNTER — Telehealth: Payer: Self-pay

## 2023-03-07 ENCOUNTER — Other Ambulatory Visit: Payer: Self-pay | Admitting: Family

## 2023-03-07 MED ORDER — CEPHALEXIN 500 MG PO CAPS: 500.0000 mg | ORAL_CAPSULE | Freq: Three times a day (TID) | ORAL | 0 refills | Status: DC

## 2023-03-07 NOTE — Telephone Encounter (Signed)
Please complete all doses of antibiotic. UTI confirmed.  No antibiotic has been sent.Please Advise

## 2023-03-08 NOTE — Telephone Encounter (Signed)
Called pt lvm medication has been sent to her pharmacy.

## 2023-05-25 DIAGNOSIS — H401131 Primary open-angle glaucoma, bilateral, mild stage: Secondary | ICD-10-CM | POA: Diagnosis not present

## 2023-05-31 ENCOUNTER — Ambulatory Visit (INDEPENDENT_AMBULATORY_CARE_PROVIDER_SITE_OTHER): Payer: Medicare Other

## 2023-05-31 VITALS — Ht 68.0 in | Wt 175.0 lb

## 2023-05-31 DIAGNOSIS — Z0000 Encounter for general adult medical examination without abnormal findings: Secondary | ICD-10-CM | POA: Diagnosis not present

## 2023-05-31 NOTE — Progress Notes (Signed)
Subjective:   Jasmine Wilson is a 82 y.o. female who presents for Medicare Annual (Subsequent) preventive examination.  Visit Complete: Virtual I connected with  AXA SANTORI on 05/31/23 by a audio enabled telemedicine application and verified that I am speaking with the correct person using two identifiers.  Patient Location: Home  Provider Location: Home Office  I discussed the limitations of evaluation and management by telemedicine. The patient expressed understanding and agreed to proceed.  Vital Signs: Because this visit was a virtual/telehealth visit, some criteria may be missing or patient reported. Any vitals not documented were not able to be obtained and vitals that have been documented are patient reported.    Cardiac Risk Factors include: advanced age (>81men, >68 women);hypertension     Objective:    Today's Vitals   05/31/23 1135  Weight: 175 lb (79.4 kg)  Height: 5\' 8"  (1.727 m)   Body mass index is 26.61 kg/m.     05/31/2023   11:42 AM 08/20/2022   11:59 AM 08/18/2022    8:03 PM 08/18/2022    6:56 PM 05/26/2022    2:10 PM 05/25/2021    1:12 PM 02/17/2018   11:14 AM  Advanced Directives  Does Patient Have a Medical Advance Directive? Yes No No No Yes No No  Type of Estate agent of [place];Living will    Healthcare Power of Sellers;Living will    Copy of Healthcare Power of Attorney in Chart? No - copy requested    No - copy requested    Would patient like information on creating a medical advance directive?  No - Patient declined No - Patient declined   No - Patient declined Yes (MAU/Ambulatory/Procedural Areas - Information given)    Current Medications (verified) Outpatient Encounter Medications as of 05/31/2023  Medication Sig   atorvastatin (LIPITOR) 20 MG tablet Take 1 tablet (20 mg total) by mouth daily.   bimatoprost (LUMIGAN) 0.01 % SOLN Place 1 drop into both eyes at bedtime.   brimonidine (ALPHAGAN) 0.2 % ophthalmic  solution Place 1 drop into the left eye every 12 (twelve) hours.   dorzolamide-timolol (COSOPT) 2-0.5 % ophthalmic solution Place 1 drop into both eyes every 12 (twelve) hours.   metoprolol succinate (TOPROL-XL) 100 MG 24 hr tablet Take 1 tablet (100 mg total) by mouth daily. TAKE WITH OR IMMEDIATELY FOLLOWING A MEAL.   timolol (BETIMOL) 0.5 % ophthalmic solution Place 1 drop into both eyes 2 (two) times daily.   [DISCONTINUED] cephALEXin (KEFLEX) 500 MG capsule Take 1 capsule (500 mg total) by mouth 3 (three) times daily.   [DISCONTINUED] levocetirizine (XYZAL) 5 MG tablet TAKE 1 TABLET BY MOUTH AT  BEDTIME AS NEEDED FOR ALLERGIES (Patient taking differently: Take 5 mg by mouth at bedtime.)   [DISCONTINUED] nystatin (MYCOSTATIN/NYSTOP) powder Apply 1 Application topically 3 (three) times daily. (Patient not taking: Reported on 02/15/2023)   No facility-administered encounter medications on file as of 05/31/2023.    Allergies (verified) Silicone, Tape, Codeine, and Oxycodone   History: Past Medical History:  Diagnosis Date   Abdominal pain 03/02/2011   Arthritis 03/02/2012   Bronchitis 04/04/2011   Cataract    Chicken pox as a child   Cough 01/29/2013   Female bladder prolapse, acquired 01/29/2011   Glaucoma 01/29/2011   Hearing aid worn    History of pneumonia    HTN (hypertension) 01/29/2011   Hyperlipidemia    Measles as a child   Meniere's disease    Pedal  edema 04/01/2016   Preventative health care 01/29/2011   Shingles    Vaginosis 01/29/2013   Past Surgical History:  Procedure Laterality Date   BREAST BIOPSY Left    CHOLECYSTECTOMY N/A 08/21/2022   Procedure: LAPAROSCOPIC CHOLECYSTECTOMY;  Surgeon: Manus Rudd, MD;  Location: [facility];  Service: General;  Laterality: N/A;   lump removed on left breast     clogged milk duct, benign   TUBAL LIGATION     Family History  Problem Relation Age of Onset   Breast cancer Mother    Stroke Father    Heart disease Father     Hyperlipidemia Father    Hypertension Father    Breast cancer Sister 78   Uterine cancer Maternal Grandmother        uterus   Social History   Socioeconomic History   Marital status: Widowed    Spouse name: Not on file   Number of children: 3   Years of education: Not on file   Highest education level: Not on file  Occupational History   Occupation: Retired  Tobacco Use   Smoking status: Former    Current packs/day: 0.00    Types: Cigarettes    Quit date: 06/21/1970    Years since quitting: 52.9   Smokeless tobacco: Never   Tobacco comments:    occasional smoker  Vaping Use   Vaping status: Never Used  Substance and Sexual Activity   Alcohol use: No   Drug use: No   Sexual activity: Never  Other Topics Concern   Not on file  Social History Narrative   Not on file   Social Determinants of Health   Financial Resource Strain: Low Risk  (05/31/2023)   Overall Financial Resource Strain (CARDIA)    Difficulty of Paying Living Expenses: Not hard at all  Food Insecurity: No Food Insecurity (05/31/2023)   Hunger Vital Sign    Worried About Running Out of Food in the Last Year: Never true    Ran Out of Food in the Last Year: Never true  Transportation Needs: No Transportation Needs (05/31/2023)   PRAPARE - Administrator, Civil Service (Medical): No    Lack of Transportation (Non-Medical): No  Physical Activity: Insufficiently Active (05/31/2023)   Exercise Vital Sign    Days of Exercise per Week: 1 day    Minutes of Exercise per Session: 60 min  Stress: No Stress Concern Present (05/31/2023)   [employer] of Occupational Health - Occupational Stress Questionnaire    Feeling of Stress : Not at all  Social Connections: Moderately Integrated (05/31/2023)   Social Connection and Isolation Panel [NHANES]    Frequency of Communication with Friends and Family: More than three times a week    Frequency of Social Gatherings with Friends and Family: More than  three times a week    Attends Religious Services: More than 4 times per year    Active Member of [employer] or Organizations: Yes    Attends Banker Meetings: More than 4 times per year    Marital Status: Widowed    Tobacco Counseling Counseling given: Not Answered Tobacco comments: occasional smoker   Clinical Intake:  Pre-visit preparation completed: Yes  Pain : No/denies pain     BMI - recorded: 26.61 Nutritional Status: BMI 25 -29 Overweight Nutritional Risks: None Diabetes: No  How often do you need to have someone help you when you read instructions, pamphlets, or other written materials from your doctor or pharmacy?:  1 - Never  Interpreter Needed?: No  Information entered by :: Theresa Mulligan LPN   Activities of Daily Living    05/31/2023   11:40 AM 08/20/2022    4:00 PM  In your present state of health, do you have any difficulty performing the following activities:  Hearing? 1 1  Comment Wears hearing aids   Vision? 0 0  Difficulty concentrating or making decisions? 0 0  Walking or climbing stairs? 0 0  Dressing or bathing? 0 0  Doing errands, shopping? 0 1  Preparing Food and eating ? N   Using the Toilet? N   In the past six months, have you accidently leaked urine? Y   Comment Wears pads. Followed by PCP   Do you have problems with loss of bowel control? N   Managing your Medications? N   Managing your Finances? N   Housekeeping or managing your Housekeeping? N     Patient Care Team: Bradd Canary, MD as PCP - General (Family Medicine) Fredrich Birks, OD as Referring Physician (Optometry)  Indicate any recent Medical Services you may have received from other than [facility] providers in the past year (date may be approximate).     Assessment:   This is a routine wellness examination for [facility]  Hearing/Vision screen Hearing Screening - Comments:: Wears hearing aids Vision Screening - Comments:: Wears rx glasses - up to date with routine eye  exams with  Dr Ronney Asters   Goals Addressed               This Visit's Progress     Increase physical activity (pt-stated)         Depression Screen    05/31/2023   11:39 AM 11/09/2022   12:45 PM 09/21/2022   11:46 AM 05/26/2022    2:09 PM 01/07/2022    1:44 PM 05/25/2021    1:16 PM 04/01/2020    2:07 PM  PHQ 2/9 Scores  PHQ - 2 Score 0 0 0 0 0 0 0  PHQ- 9 Score  3 0        Fall Risk    05/31/2023   11:42 AM 11/09/2022   12:21 PM 09/21/2022   11:46 AM 05/26/2022    2:11 PM 01/07/2022    1:43 PM  Fall Risk   Falls in the past year? 0 0 0 0 0  Number falls in past yr: 0 0 0 0 0  Injury with Fall? 0 0 0 0 0  Risk for fall due to : No Fall Risks No Fall Risks  Impaired vision;Impaired balance/gait No Fall Risks  Follow up Falls prevention discussed Falls evaluation completed Falls evaluation completed Falls prevention discussed Falls evaluation completed    MEDICARE RISK AT HOME: Medicare Risk at Home Any stairs in or around the home?: No If so, are there any without handrails?: No Home free of loose throw rugs in walkways, pet beds, electrical cords, etc?: Yes Adequate lighting in your home to reduce risk of falls?: Yes Life alert?: Yes Use of a cane, walker or w/c?: No Grab bars in the bathroom?: Yes Shower chair or bench in shower?: Yes Elevated toilet seat or a handicapped toilet?: Yes  TIMED UP AND GO:  Was the test performed?  No    Cognitive Function:    01/04/2017    1:35 PM  MMSE - Mini Mental State Exam  Orientation to time 5  Orientation to Place 5  Registration 3  Attention/ Calculation  5  Recall 2  Language- name 2 objects 2  Language- repeat 1  Language- follow 3 step command 3  Language- read & follow direction 1  Write a sentence 1  Copy design 1  Total score 29        05/31/2023   11:42 AM  6CIT Screen  What Year? 0 points  What month? 0 points  What time? 0 points  Count back from 20 0 points  Months in reverse 0 points  Repeat  phrase 0 points  Total Score 0 points    Immunizations Immunization History  Administered Date(s) Administered   PFIZER(Purple Top)SARS-COV-2 Vaccination 07/27/2019, 08/17/2019, 04/01/2020   Pneumococcal Conjugate-13 05/06/2015   Pneumococcal Polysaccharide-23 04/03/2018   Tdap 02/24/2012    TDAP status: Due, Education has been provided regarding the importance of this vaccine. Advised may receive this vaccine at local pharmacy or Health Dept. Aware to provide a copy of the vaccination record if obtained from local pharmacy or Health Dept. Verbalized acceptance and understanding.  Flu Vaccine status: Declined, Education has been provided regarding the importance of this vaccine but patient still declined. Advised may receive this vaccine at local pharmacy or Health Dept. Aware to provide a copy of the vaccination record if obtained from local pharmacy or Health Dept. Verbalized acceptance and understanding.  Pneumococcal vaccine status: Up to date  Covid-19 vaccine status: Declined, Education has been provided regarding the importance of this vaccine but patient still declined. Advised may receive this vaccine at local pharmacy or Health Dept.or vaccine clinic. Aware to provide a copy of the vaccination record if obtained from local pharmacy or Health Dept. Verbalized acceptance and understanding.  Qualifies for Shingles Vaccine? Yes   Zostavax completed No   Shingrix Completed?: No.    Education has been provided regarding the importance of this vaccine. Patient has been advised to call insurance company to determine out of pocket expense if they have not yet received this vaccine. Advised may also receive vaccine at local pharmacy or Health Dept. Verbalized acceptance and understanding.  Screening Tests Health Maintenance  Topic Date Due   Zoster Vaccines- Shingrix (1 of 2) Never done   DEXA SCAN  Never done   DTaP/Tdap/Td (2 - Td or Tdap) 02/23/2022   INFLUENZA VACCINE  Never done    COVID-19 Vaccine (4 - 2023-24 season) 02/20/2023   Medicare Annual Wellness (AWV)  05/30/2024   Pneumonia Vaccine 86+ Years old  Completed   HPV VACCINES  Aged Out    Health Maintenance  Health Maintenance Due  Topic Date Due   Zoster Vaccines- Shingrix (1 of 2) Never done   DEXA SCAN  Never done   DTaP/Tdap/Td (2 - Td or Tdap) 02/23/2022   INFLUENZA VACCINE  Never done   COVID-19 Vaccine (4 - 2023-24 season) 02/20/2023        Bone Density status: Ordered Deferred. Pt provided with contact info and advised to call to schedule appt.    Additional Screening:    Vision Screening: Recommended annual ophthalmology exams for early detection of glaucoma and other disorders of the eye. Is the patient up to date with their annual eye exam?  Yes  Who is the provider or what is the name of the office in which the patient attends annual eye exams? Dr Ronney Asters If pt is not established with a provider, would they like to be referred to a provider to establish care? No .   Dental Screening: Recommended annual dental exams for  proper oral hygiene    Community Resource Referral / Chronic Care Management:  CRR required this visit?  No   CCM required this visit?  No     Plan:     I have personally reviewed and noted the following in the patient's chart:   Medical and social history Use of alcohol, tobacco or illicit drugs  Current medications and supplements including opioid prescriptions. Patient is not currently taking opioid prescriptions. Functional ability and status Nutritional status Physical activity Advanced directives List of other physicians Hospitalizations, surgeries, and ER visits in previous 12 months Vitals Screenings to include cognitive, depression, and falls Referrals and appointments  In addition, I have reviewed and discussed with patient certain preventive protocols, quality metrics, and best practice recommendations. A written personalized care  plan for preventive services as well as general preventive health recommendations were provided to patient.     Tillie Rung, LPN   [id number]   After Visit Summary: (MyChart) Due to this being a telephonic visit, the after visit summary with patients personalized plan was offered to patient via MyChart   Nurse Notes: Non e

## 2023-05-31 NOTE — Patient Instructions (Addendum)
Jasmine Wilson , Thank you for taking time to come for your Medicare Wellness Visit. I appreciate your ongoing commitment to your health goals. Please review the following plan we discussed and let me know if I can assist you in the future.   Referrals/Orders/Follow-Ups/Clinician Recommendations:   This is a list of the screening recommended for you and due dates:  Health Maintenance  Topic Date Due   Zoster (Shingles) Vaccine (1 of 2) Never done   DEXA scan (bone density measurement)  Never done   DTaP/Tdap/Td vaccine (2 - Td or Tdap) 02/23/2022   Flu Shot  Never done   COVID-19 Vaccine (4 - 2023-24 season) 02/20/2023   Medicare Annual Wellness Visit  05/30/2024   Pneumonia Vaccine  Completed   HPV Vaccine  Aged Out    Advanced directives: (Copy Requested) Please bring a copy of your health care power of attorney and living will to the office to be added to your chart at your convenience.  Next Medicare Annual Wellness Visit scheduled for next year: Yes

## 2023-08-15 ENCOUNTER — Other Ambulatory Visit: Payer: Self-pay | Admitting: Family Medicine

## 2023-08-15 DIAGNOSIS — I10 Essential (primary) hypertension: Secondary | ICD-10-CM

## 2023-09-12 DIAGNOSIS — L720 Epidermal cyst: Secondary | ICD-10-CM | POA: Diagnosis not present

## 2023-09-12 DIAGNOSIS — L814 Other melanin hyperpigmentation: Secondary | ICD-10-CM | POA: Diagnosis not present

## 2023-09-12 DIAGNOSIS — L821 Other seborrheic keratosis: Secondary | ICD-10-CM | POA: Diagnosis not present

## 2023-09-12 DIAGNOSIS — L570 Actinic keratosis: Secondary | ICD-10-CM | POA: Diagnosis not present

## 2023-09-12 DIAGNOSIS — D1801 Hemangioma of skin and subcutaneous tissue: Secondary | ICD-10-CM | POA: Diagnosis not present

## 2023-09-12 DIAGNOSIS — L918 Other hypertrophic disorders of the skin: Secondary | ICD-10-CM | POA: Diagnosis not present

## 2023-11-15 ENCOUNTER — Other Ambulatory Visit: Payer: Self-pay | Admitting: Family Medicine

## 2023-11-15 DIAGNOSIS — I10 Essential (primary) hypertension: Secondary | ICD-10-CM

## 2023-11-15 DIAGNOSIS — E782 Mixed hyperlipidemia: Secondary | ICD-10-CM

## 2023-11-15 MED ORDER — ATORVASTATIN CALCIUM 20 MG PO TABS: 20.0000 mg | ORAL_TABLET | Freq: Every day | ORAL | 0 refills | Status: DC

## 2023-11-15 MED ORDER — METOPROLOL SUCCINATE ER 100 MG PO TB24: 100.0000 mg | ORAL_TABLET | Freq: Every day | ORAL | 0 refills | Status: DC

## 2023-11-15 NOTE — Addendum Note (Signed)
 Addended by: Ayris Carano C on: 11/15/2023 11:17 AM   Modules accepted: Orders

## 2023-11-22 DIAGNOSIS — H401131 Primary open-angle glaucoma, bilateral, mild stage: Secondary | ICD-10-CM | POA: Diagnosis not present

## 2023-12-01 ENCOUNTER — Other Ambulatory Visit: Payer: Self-pay | Admitting: Family Medicine

## 2023-12-01 DIAGNOSIS — E782 Mixed hyperlipidemia: Secondary | ICD-10-CM

## 2023-12-01 DIAGNOSIS — I10 Essential (primary) hypertension: Secondary | ICD-10-CM

## 2024-02-22 ENCOUNTER — Ambulatory Visit: Payer: Self-pay

## 2024-02-22 NOTE — Telephone Encounter (Signed)
 FYI Only or Action Required?: FYI only for provider.  Patient was last seen in primary care on 11/09/2022 by Almarie Waddell NOVAK, NP.  Called Nurse Triage reporting Diarrhea.  Symptoms began several months ago.  Interventions attempted: Nothing.  Symptoms are: unchanged.  Triage Disposition: See Physician Within 24 Hours  Patient/caregiver understands and will follow disposition?: Yes     Copied from CRM #[id number]. Topic: Clinical - Pink Word Triage >> Feb 21, 2024  9:24 AM Pinkey ORN wrote: Reason for Triage: Uncontrolled Diarrhea >> Feb 22, 2024  9:52 AM Mia F wrote: Pt called in yesterday to speak with a nurse about her uncontrollable diarrhea and did not get a call back. She says she had her gallbladder removed and since she has had a lot of diarrhea. She says she eats and within 30 minutes it runs through her. She says this had her gallbladder removed 18 months ago and it has been going on since. Pt states she is beginning to feel weak.  >> Feb 21, 2024  9:24 AM Pinkey ORN wrote: Patient states her gall bladder has been removed.  Reason for Disposition  [1] MODERATE diarrhea (e.g., 4-6 times / day more than normal) AND [2] age > 70 years  Answer Assessment - Initial Assessment Questions 1. DIARRHEA SEVERITY: How bad is the diarrhea? How many more stools have you had in the past 24 hours than normal?      0, states took an imodium and it stopped but comes back 2. ONSET: When did the diarrhea begin?      As soon as gallbladder was taken out,  3. STOOL DESCRIPTION:  How loose or watery is the diarrhea? What is the stool color? Is there any blood or mucous in the stool?     Starts out with big chunks and then water 4. VOMITING: Are you also vomiting? If Yes, ask: How many times in the past 24 hours?      denies 5. ABDOMEN PAIN: Are you having any abdomen pain? If Yes, ask: What does it feel like? (e.g., crampy, dull, intermittent, constant)      Sometimes it  rumbles 6. ABDOMEN PAIN SEVERITY: If present, ask: How bad is the pain?  (e.g., Scale 1-10; mild, moderate, or severe)     denies 7. ORAL INTAKE: If vomiting, Have you been able to drink liquids? How much liquids have you had in the past 24 hours?     na 8. HYDRATION: Any signs of dehydration? (e.g., dry mouth [not just dry lips], too weak to stand, dizziness, new weight loss) When did you last urinate?     weakness 9. EXPOSURE: Have you traveled to a foreign country recently? Have you been exposed to anyone with diarrhea? Could you have eaten any food that was spoiled?     denies 10. ANTIBIOTIC USE: Are you taking antibiotics now or have you taken antibiotics in the past 2 months?       no 11. OTHER SYMPTOMS: Do you have any other symptoms? (e.g., fever, blood in stool)       no 12. PREGNANCY: Is there any chance you are pregnant? When was your last menstrual period?       na  Protocols used: [facility]

## 2024-02-22 NOTE — Telephone Encounter (Signed)
 Appt scheduled

## 2024-02-23 ENCOUNTER — Encounter: Payer: Self-pay | Admitting: Family Medicine

## 2024-02-23 ENCOUNTER — Ambulatory Visit (INDEPENDENT_AMBULATORY_CARE_PROVIDER_SITE_OTHER): Admitting: Family Medicine

## 2024-02-23 VITALS — BP 118/80 | HR 83 | Temp 98.0°F | Resp 18 | Ht 68.0 in | Wt 182.2 lb

## 2024-02-23 DIAGNOSIS — R197 Diarrhea, unspecified: Secondary | ICD-10-CM | POA: Diagnosis not present

## 2024-02-23 LAB — POC URINALSYSI DIPSTICK (AUTOMATED)
Bilirubin, UA: NEGATIVE
Blood, UA: NEGATIVE
Glucose, UA: NEGATIVE
Ketones, UA: NEGATIVE
Leukocytes, UA: NEGATIVE
Nitrite, UA: NEGATIVE
Protein, UA: NEGATIVE
Spec Grav, UA: 1.02 (ref 1.010–1.025)
Urobilinogen, UA: 0.2 U/dL
pH, UA: 5 (ref 5.0–8.0)

## 2024-02-23 MED ORDER — CHOLESTYRAMINE 4 G PO PACK: 4.0000 g | PACK | Freq: Two times a day (BID) | ORAL | 12 refills | Status: DC

## 2024-02-23 NOTE — Progress Notes (Signed)
 Subjective:    Patient ID: Jasmine Wilson, female    DOB: 1940-09-02, 83 y.o.   MRN: [id number]  Chief Complaint  Patient presents with   Diarrhea    Sxs going on for forever, pt states gallbladder was removed 08/2022    HPI Patient is in today for diarrhea since her gall bladder surgery in 2024.  Discussed the use of AI scribe software for clinical note transcription with the patient, who gave verbal consent to proceed.  History of Present Illness Jasmine Wilson is an 83 year old female who presents with chronic diarrhea following gallbladder removal.  She has experienced chronic diarrhea since March 2024, following her gallbladder removal. The diarrhea is explosive and watery, occurring unpredictably and significantly impacting her daily life. She is afraid to leave her home due to the frequency and urgency of her bowel movements.  She has previously consulted with medical professionals and was advised to take fiber, which has not been effective. She uses Imodium occasionally to manage symptoms but is reluctant to rely on it regularly. Her symptoms have led to increased anxiety, as she is concerned about having accidents in public.  The diarrhea can be triggered by meals, but specific triggers are inconsistent. She provides an example of a meal that led to an episode, which included ground [place], green beans, mashed potatoes, and cranberry sauce. No blood in stool, fever, or abdominal pain. She has a history of providing a stool sample for analysis as part of her previous workup.  Her current dietary pattern includes eating twice a day, around noon and five in the evening, which she states is normal for her since retirement.  She also mentions a history of kidney stones discovered during her gallbladder surgery, for which she is under surveillance by a urologist.    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   Glaucoma 01/29/2011   Hearing aid worn    History of pneumonia    HTN (hypertension) 01/29/2011   Hyperlipidemia    Measles as a child   Meniere's disease    Pedal edema 04/01/2016   Preventative health care 01/29/2011   Shingles    Vaginosis 01/29/2013    Past Surgical History:  Procedure Laterality Date   BREAST BIOPSY Left    CHOLECYSTECTOMY N/A 08/21/2022   Procedure: LAPAROSCOPIC CHOLECYSTECTOMY;  Surgeon: Belinda Cough, MD;  Location: [facility];  Service: General;  Laterality: N/A;   lump removed on left breast     clogged milk duct, benign   TUBAL LIGATION      Family History  Problem Relation Age of Onset   Breast cancer Mother    Stroke Father    Heart disease Father    Hyperlipidemia Father    Hypertension Father    Breast cancer Sister 56   Uterine cancer Maternal Grandmother        uterus    Social History   Socioeconomic History   Marital status: Widowed    Spouse name: Not on file   Number of children: 3   Years of education: Not on file   Highest education level: Not on file  Occupational History   Occupation: Retired  Tobacco Use   Smoking status: Former    Current packs/day: 0.00    Types: Cigarettes  Quit date: 06/21/1970    Years since quitting: 53.7   Smokeless tobacco: Never   Tobacco comments:    occasional smoker  Vaping Use   Vaping status: Never Used  Substance and Sexual Activity   Alcohol use: No   Drug use: No   Sexual activity: Never  Other Topics Concern   Not on file  Social History Narrative   Not on file   Social Drivers of Health   Financial Resource Strain: Low Risk  (05/31/2023)   Overall Financial Resource Strain (CARDIA)    Difficulty of Paying Living Expenses: Not hard at all  Food Insecurity: No Food Insecurity (05/31/2023)   Hunger Vital Sign    Worried About Running Out of Food in the Last Year: Never true     Ran Out of Food in the Last Year: Never true  Transportation Needs: No Transportation Needs (05/31/2023)   PRAPARE - Administrator, Civil Service (Medical): No    Lack of Transportation (Non-Medical): No  Physical Activity: Insufficiently Active (05/31/2023)   Exercise Vital Sign    Days of Exercise per Week: 1 day    Minutes of Exercise per Session: 60 min  Stress: No Stress Concern Present (05/31/2023)   [employer] of Occupational Health - Occupational Stress Questionnaire    Feeling of Stress : Not at all  Social Connections: Moderately Integrated (05/31/2023)   Social Connection and Isolation Panel    Frequency of Communication with Friends and Family: More than three times a week    Frequency of Social Gatherings with Friends and Family: More than three times a week    Attends Religious Services: More than 4 times per year    Active Member of [employer] or Organizations: Yes    Attends Banker Meetings: More than 4 times per year    Marital Status: Widowed  Intimate Partner Violence: Not At Risk (05/31/2023)   Humiliation, Afraid, Rape, and Kick questionnaire    Fear of Current or Ex-Partner: No    Emotionally Abused: No    Physically Abused: No    Sexually Abused: No    Outpatient Medications Prior to Visit  Medication Sig Dispense Refill   atorvastatin  (LIPITOR) 20 MG tablet TAKE 1 TABLET BY MOUTH DAILY 30 tablet 11   bimatoprost (LUMIGAN) 0.01 % SOLN Place 1 drop into both eyes at bedtime.     brimonidine  (ALPHAGAN ) 0.2 % ophthalmic solution Place 1 drop into the left eye every 12 (twelve) hours.     dorzolamide -timolol  (COSOPT ) 2-0.5 % ophthalmic solution Place 1 drop into both eyes every 12 (twelve) hours.     metoprolol  succinate (TOPROL -XL) 100 MG 24 hr tablet TAKE 1 TABLET BY MOUTH DAILY 30 tablet 11   timolol  (BETIMOL ) 0.5 % ophthalmic solution Place 1 drop into both eyes 2 (two) times daily.     No facility-administered medications prior  to visit.    Allergies  Allergen Reactions   Silicone Other (See Comments)    Blisters the skin- patient PREFERS COBAN WRAP   Tape Other (See Comments)    Blisters the skin- patient PREFERS COBAN WRAP   Codeine Other (See Comments)    Made the patient feel funny   Oxycodone Nausea And Vomiting    Review of Systems  Constitutional:  Negative for fever and malaise/fatigue.  HENT:  Negative for congestion.   Eyes:  Negative for blurred vision.  Respiratory:  Negative for cough and shortness of breath.   Cardiovascular:  Negative for chest pain, palpitations and leg swelling.  Gastrointestinal:  Positive for diarrhea. Negative for blood in stool, constipation, melena and vomiting.  Musculoskeletal:  Negative for back pain.  Skin:  Negative for rash.  Neurological:  Negative for loss of consciousness and headaches.       Objective:    Physical Exam Vitals and nursing note reviewed.  Constitutional:      General: She is not in acute distress.    Appearance: Normal appearance. She is well-developed.  HENT:     Head: Normocephalic and atraumatic.  Eyes:     General: No scleral icterus.       Right eye: No discharge.        Left eye: No discharge.  Cardiovascular:     Rate and Rhythm: Normal rate and regular rhythm.     Heart sounds: No murmur heard. Pulmonary:     Effort: Pulmonary effort is normal. No respiratory distress.     Breath sounds: Normal breath sounds.  Musculoskeletal:        General: Normal range of motion.     Cervical back: Normal range of motion and neck supple.     Right lower leg: No edema.     Left lower leg: No edema.  Skin:    General: Skin is warm and dry.  Neurological:     Mental Status: She is alert and oriented to person, place, and time.  Psychiatric:        Mood and Affect: Mood normal.        Behavior: Behavior normal.        Thought Content: Thought content normal.        Judgment: Judgment normal.     BP 118/80 (BP Location:  Left Arm, Patient Position: Sitting, Cuff Size: Large)   Pulse 83   Temp 98 F (36.7 C) (Oral)   Resp 18   Ht 5' 8 (1.727 m)   Wt 182 lb 3.2 oz (82.6 kg)   SpO2 96%   BMI 27.70 kg/m  Wt Readings from Last 3 Encounters:  02/23/24 182 lb 3.2 oz (82.6 kg)  05/31/23 175 lb (79.4 kg)  02/15/23 181 lb (82.1 kg)    Diabetic Foot Exam - Simple   No data filed    Lab Results  Component Value Date   WBC 9.1 09/21/2022   HGB 14.6 09/21/2022   HCT 43.6 09/21/2022   PLT 319.0 09/21/2022   GLUCOSE 89 11/09/2022   CHOL 233 (H) 09/21/2022   TRIG 305.0 (H) 09/21/2022   HDL 52.30 09/21/2022   LDLDIRECT 113.0 09/21/2022   LDLCALC 68 12/26/2019   ALT 11 11/09/2022   AST 15 11/09/2022   NA 139 11/09/2022   K 4.5 11/09/2022   CL 105 11/09/2022   CREATININE 0.70 11/09/2022   BUN 13 11/09/2022   CO2 25 11/09/2022   TSH 1.15 09/21/2022   HGBA1C 6.0 09/16/2021    Lab Results  Component Value Date   TSH 1.15 09/21/2022   Lab Results  Component Value Date   WBC 9.1 09/21/2022   HGB 14.6 09/21/2022   HCT 43.6 09/21/2022   MCV 89.9 09/21/2022   PLT 319.0 09/21/2022   Lab Results  Component Value Date   NA 139 11/09/2022   K 4.5 11/09/2022   CO2 25 11/09/2022   GLUCOSE 89 11/09/2022   BUN 13 11/09/2022   CREATININE 0.70 11/09/2022   BILITOT 0.5 11/09/2022   ALKPHOS 91 11/09/2022   AST 15  11/09/2022   ALT 11 11/09/2022   PROT 6.1 11/09/2022   ALBUMIN 4.1 11/09/2022   CALCIUM  9.3 11/09/2022   ANIONGAP 9 08/20/2022   GFR 80.94 11/09/2022   Lab Results  Component Value Date   CHOL 233 (H) 09/21/2022   Lab Results  Component Value Date   HDL 52.30 09/21/2022   Lab Results  Component Value Date   LDLCALC 68 12/26/2019   Lab Results  Component Value Date   TRIG 305.0 (H) 09/21/2022   Lab Results  Component Value Date   CHOLHDL 4 09/21/2022   Lab Results  Component Value Date   HGBA1C 6.0 09/16/2021       Assessment & Plan:  Diarrhea, unspecified type -      Cholestyramine ; Take 1 packet (4 g total) by mouth 2 (two) times daily.  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 These occur unpredictably, significantly disrupting her lifestyle. Previous fiber management was ineffective. There is no fever or abdominal pain, and post-cholecystectomy syndrome is considered. Imodium provides temporary relief but is not suitable long-term. Questran  is proposed as it is often effective for this condition. If Questran  is ineffective, the dosage can be increased or a referral to gastroenterology considered. Prescribe Questran  twice daily with meals. Order an electrolyte panel to assess for imbalances and a complete blood count to check for infection. Advise her to report if Questran  is ineffective for potential dosage adjustment or referral.  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 Managing the diarrhea with Questran  is expected to alleviate anxiety.    Fernanda Twaddell R Lowne Chase, DO

## 2024-02-24 LAB — COMPREHENSIVE METABOLIC PANEL WITH GFR
ALT: 12 U/L (ref 0–35)
AST: 18 U/L (ref 0–37)
Albumin: 4.3 g/dL (ref 3.5–5.2)
Alkaline Phosphatase: 79 U/L (ref 39–117)
BUN: 15 mg/dL (ref 6–23)
CO2: 26 meq/L (ref 19–32)
Calcium: 8.9 mg/dL (ref 8.4–10.5)
Chloride: 104 meq/L (ref 96–112)
Creatinine, Ser: 0.78 mg/dL (ref 0.40–1.20)
GFR: 70.44 mL/min (ref >60.00)
Glucose, Bld: 112 mg/dL — ABNORMAL HIGH (ref 70–99)
Potassium: 4.7 meq/L (ref 3.5–5.1)
Sodium: 141 meq/L (ref 135–145)
Total Bilirubin: 0.6 mg/dL (ref 0.2–1.2)
Total Protein: 6.4 g/dL (ref 6.0–8.3)

## 2024-02-24 LAB — CBC WITH DIFFERENTIAL/PLATELET
Basophils Absolute: 0.1 K/uL (ref 0.0–0.1)
Basophils Relative: 0.9 % (ref 0.0–3.0)
Eosinophils Absolute: 0.1 K/uL (ref 0.0–0.7)
Eosinophils Relative: 0.7 % (ref 0.0–5.0)
HCT: 46.1 % — ABNORMAL HIGH (ref 36.0–46.0)
Hemoglobin: 15.1 g/dL — ABNORMAL HIGH (ref 12.0–15.0)
Lymphocytes Relative: 22.8 % (ref 12.0–46.0)
Lymphs Abs: 1.8 K/uL (ref 0.7–4.0)
MCHC: 32.8 g/dL (ref 30.0–36.0)
MCV: 92.2 fl (ref 78.0–100.0)
Monocytes Absolute: 0.4 K/uL (ref 0.1–1.0)
Monocytes Relative: 5.4 % (ref 3.0–12.0)
Neutro Abs: 5.5 K/uL (ref 1.4–7.7)
Neutrophils Relative %: 70.2 % (ref 43.0–77.0)
Platelets: 243 K/uL (ref 150.0–400.0)
RBC: 5 Mil/uL (ref 3.87–5.11)
RDW: 14.2 % (ref 11.5–15.5)
WBC: 7.8 K/uL (ref 4.0–10.5)

## 2024-02-26 ENCOUNTER — Ambulatory Visit: Payer: Self-pay | Admitting: Family Medicine

## 2024-02-27 ENCOUNTER — Ambulatory Visit: Payer: Self-pay

## 2024-02-27 NOTE — Telephone Encounter (Signed)
 FYI Only or Action Required?: Action required by provider: clinical question for provider, update on patient condition, and patient is now constipated after using Questran .  Please call patient to advise.  Patient was last seen in primary care on 02/23/2024 by Antonio Meth, Jamee SAUNDERS, DO.  Called Nurse Triage reporting Constipation.  Symptoms began several days ago.  Interventions attempted: Nothing.  Symptoms are: unchanged.  Triage Disposition: Home Care  Patient/caregiver understands and will follow disposition?: Yes  Message from [place] C sent at 02/27/2024  3:46 PM EDT  Patient called in stated she was seen in the office for diarrhea last week was prescribed cholestyramine  (QUESTRAN ) 4 g packet , now isnt able to use the restroom at all would like a callback on what she needs to do   Reason for Disposition  MILD constipation  Answer Assessment - Initial Assessment Questions PLEASE CALL PATIENT WITH RECOMMENDATIONS  1. STOOL PATTERN OR FREQUENCY: How often do you have a bowel movement (BM)?  (Normal range: 3 times a day to every 3 days)  When was your last BM?       Three days ago 2. STRAINING: Do you have to strain to have a BM?      Yes, hardly any stool passes 3. ONSET: When did the constipation begin?     Three days ago 4. RECTAL PAIN: Does your rectum hurt when the stool comes out? If Yes, ask: Do you have hemorrhoids? How bad is the pain?  (Scale 1-10; or mild, moderate, severe)     denies 5. BM COMPOSITION: Are the stools hard?      Small hard stool 6. BLOOD ON STOOLS: Has there been any blood on the toilet tissue or on the surface of the BM? If Yes, ask: When was the last time?     N/a 7. CHRONIC CONSTIPATION: Is this a new problem for you?  If No, ask: How long have you had this problem? (days, weeks, months)      denies 8. CHANGES IN DIET OR HYDRATION: Have there been any recent changes in your diet? How much fluids are you drinking on a daily  basis?  How much have you had to drink today?     denies 9. MEDICINES: Have you been taking any new medicines? Are you taking any narcotic pain medicines? (e.g., Dilaudid , morphine , Percocet, Vicodin)     Questran . Diarrhea has stopped, but now patient is constipated 10. LAXATIVES: Have you been using any stool softeners, laxatives, or enemas?  If Yes, ask What are you using, how often, and when was the last time?       denies 11. ACTIVITY:  How much walking do you do every day?  Has your activity level decreased in the past week?        N/A 12. CAUSE: What do you think is causing the constipation?        Recent medicine to stop diarrhea, questran  13. MEDICAL HISTORY: Do you have a history of hemorrhoids, rectal fissures, rectal surgery, or rectal abscess?         History of hemorrhoids 14. OTHER SYMPTOMS: Do you have any other symptoms? (e.g., abdomen pain, bloating, fever, vomiting)       denies 15. PREGNANCY: Is there any chance you are pregnant? When was your last menstrual period?       N/A  Protocols used: Constipation-A-AH

## 2024-02-27 NOTE — Telephone Encounter (Signed)
 Called pt - lmom to return our call         Patient called in stated she was seen in the office for diarrhea last week was prescribed cholestyramine  (QUESTRAN ) 4 g packet , now isnt able to use the restroom at all would like a callback on what she needs to do

## 2024-03-01 ENCOUNTER — Ambulatory Visit: Payer: Self-pay

## 2024-03-01 DIAGNOSIS — N200 Calculus of kidney: Secondary | ICD-10-CM | POA: Diagnosis not present

## 2024-03-01 NOTE — Telephone Encounter (Signed)
 FYI Only or Action Required?: Action required by provider: clinical question for provider.  Patient was last seen in primary care on 02/23/2024 by Antonio Meth, Jamee SAUNDERS, DO.  Called Nurse Triage reporting Diarrhea and Medication Problem.  Symptoms began today.  Interventions attempted: Prescription medications: cholestyramine  (QUESTRAN ) 4 g packet.  Symptoms are: unchanged.  Triage Disposition: Call PCP Now  Patient/caregiver understands and will follow disposition?: Yes        Copied from CRM [id number]. Topic: Clinical - Red Word Triage >> Mar 01, 2024 11:17 AM [facility] ORN wrote: Red Word that prompted transfer to Nurse Triage: Still having symptoms worsening diarrhea after stopping medication . Reason for Disposition  [1] Caller has URGENT medicine question about med that primary care doctor (or NP/PA) or specialist prescribed AND [2] triager unable to answer question  Answer Assessment - Initial Assessment Questions 1. NAME of MEDICINE: What medicine(s) are you calling about?     cholestyramine  (QUESTRAN ) 4 g packet 2. QUESTION: What is your question? (e.g., double dose of medicine, side effect)     Stopped Rx on Monday, now with loose stools Reports took Rx x 2 days and caused constipation, but now loose stools are returning 3. PRESCRIBER: Who prescribed the medicine? Reason: if prescribed by specialist, call should be referred to that group.     cholestyramine  (QUESTRAN ) 4 g packet 4. SYMPTOMS: Do you have any symptoms? If Yes, ask: What symptoms are you having?  How bad are the symptoms (e.g., mild, moderate, severe)     Loose stool 5. PREGNANCY:  Is there any chance that you are pregnant? When was your last menstrual period?     N/a    Pt calling back to follow up question for provider re: Rx. Pt reports that she has not heard back. Triager will forward encounter for Dr Domenica 's office to review and advise. Patient verbalized understanding and is expecting  call back from office for next steps.  Protocols used: Medication Question Call-A-AH

## 2024-03-04 ENCOUNTER — Other Ambulatory Visit: Payer: Self-pay | Admitting: Family

## 2024-03-04 DIAGNOSIS — R197 Diarrhea, unspecified: Secondary | ICD-10-CM

## 2024-03-04 MED ORDER — CHOLESTYRAMINE 4 G PO PACK: 4.0000 g | PACK | Freq: Four times a day (QID) | ORAL | Status: DC

## 2024-03-05 NOTE — Telephone Encounter (Signed)
 Patient was advised and verbalized understanding. She would like to discuss more with Dr. Domenica that her next appointment on 04/16/24.

## 2024-04-15 DIAGNOSIS — R739 Hyperglycemia, unspecified: Secondary | ICD-10-CM | POA: Insufficient documentation

## 2024-04-15 NOTE — Assessment & Plan Note (Signed)
 Patient encouraged to maintain heart healthy diet, regular exercise, adequate sleep. Consider daily probiotics. Take medications as prescribed. Labs ordered and reviewed. Given and reviewed copy of ACP documents from [employer] and encouraged to complete and return.  Colonoscopy has aged out MM aged out Pap aged out Dexa

## 2024-04-15 NOTE — Assessment & Plan Note (Signed)
minimize simple carbs. Increase exercise as tolerated.  

## 2024-04-15 NOTE — Assessment & Plan Note (Signed)
 Encourage heart healthy diet such as MIND or DASH diet, increase exercise, avoid trans fats, simple carbohydrates and processed foods, consider a krill or fish or flaxseed oil cap daily.

## 2024-04-15 NOTE — Assessment & Plan Note (Signed)
 Well controlled, no changes to meds. Encouraged heart healthy diet such as the DASH diet and exercise as tolerated.

## 2024-04-16 ENCOUNTER — Encounter: Payer: Self-pay | Admitting: Family Medicine

## 2024-04-16 ENCOUNTER — Ambulatory Visit: Payer: Self-pay | Admitting: Family Medicine

## 2024-04-16 ENCOUNTER — Other Ambulatory Visit (HOSPITAL_BASED_OUTPATIENT_CLINIC_OR_DEPARTMENT_OTHER): Payer: Self-pay

## 2024-04-16 ENCOUNTER — Ambulatory Visit: Admitting: Family Medicine

## 2024-04-16 VITALS — BP 138/72 | HR 80 | Temp 97.5°F | Ht 68.0 in | Wt 182.6 lb

## 2024-04-16 DIAGNOSIS — E782 Mixed hyperlipidemia: Secondary | ICD-10-CM | POA: Diagnosis not present

## 2024-04-16 DIAGNOSIS — I10 Essential (primary) hypertension: Secondary | ICD-10-CM

## 2024-04-16 DIAGNOSIS — Z0000 Encounter for general adult medical examination without abnormal findings: Secondary | ICD-10-CM | POA: Diagnosis not present

## 2024-04-16 DIAGNOSIS — R197 Diarrhea, unspecified: Secondary | ICD-10-CM

## 2024-04-16 DIAGNOSIS — R739 Hyperglycemia, unspecified: Secondary | ICD-10-CM

## 2024-04-16 LAB — LIPID PANEL
Cholesterol: 168 mg/dL (ref 0–200)
HDL: 65.9 mg/dL (ref >39.00)
LDL Cholesterol: 64 mg/dL (ref 0–99)
NonHDL: 101.65
Total CHOL/HDL Ratio: 3
Triglycerides: 188 mg/dL — ABNORMAL HIGH (ref 0.0–149.0)
VLDL: 37.6 mg/dL (ref 0.0–40.0)

## 2024-04-16 LAB — COMPREHENSIVE METABOLIC PANEL WITH GFR
ALT: 12 U/L (ref 0–35)
AST: 14 U/L (ref 0–37)
Albumin: 4.5 g/dL (ref 3.5–5.2)
Alkaline Phosphatase: 81 U/L (ref 39–117)
BUN: 11 mg/dL (ref 6–23)
CO2: 25 meq/L (ref 19–32)
Calcium: 9.5 mg/dL (ref 8.4–10.5)
Chloride: 104 meq/L (ref 96–112)
Creatinine, Ser: 0.75 mg/dL (ref 0.40–1.20)
GFR: 73.76 mL/min (ref >60.00)
Glucose, Bld: 92 mg/dL (ref 70–99)
Potassium: 4.6 meq/L (ref 3.5–5.1)
Sodium: 139 meq/L (ref 135–145)
Total Bilirubin: 0.7 mg/dL (ref 0.2–1.2)
Total Protein: 6.5 g/dL (ref 6.0–8.3)

## 2024-04-16 LAB — CBC WITH DIFFERENTIAL/PLATELET
Basophils Absolute: 0 K/uL (ref 0.0–0.1)
Basophils Relative: 0.6 % (ref 0.0–3.0)
Eosinophils Absolute: 0.1 K/uL (ref 0.0–0.7)
Eosinophils Relative: 1.5 % (ref 0.0–5.0)
HCT: 46.1 % — ABNORMAL HIGH (ref 36.0–46.0)
Hemoglobin: 15 g/dL (ref 12.0–15.0)
Lymphocytes Relative: 26.2 % (ref 12.0–46.0)
Lymphs Abs: 2.1 K/uL (ref 0.7–4.0)
MCHC: 32.5 g/dL (ref 30.0–36.0)
MCV: 92.4 fl (ref 78.0–100.0)
Monocytes Absolute: 0.5 K/uL (ref 0.1–1.0)
Monocytes Relative: 6.5 % (ref 3.0–12.0)
Neutro Abs: 5.2 K/uL (ref 1.4–7.7)
Neutrophils Relative %: 65.2 % (ref 43.0–77.0)
Platelets: 247 K/uL (ref 150.0–400.0)
RBC: 4.99 Mil/uL (ref 3.87–5.11)
RDW: 14.5 % (ref 11.5–15.5)
WBC: 7.9 K/uL (ref 4.0–10.5)

## 2024-04-16 LAB — TSH: TSH: 2.49 u[IU]/mL (ref 0.35–5.50)

## 2024-04-16 LAB — HEMOGLOBIN A1C: Hgb A1c MFr Bld: 5.7 % (ref 4.6–6.5)

## 2024-04-16 MED ORDER — PREVNAR 20 0.5 ML IM SUSY
PREFILLED_SYRINGE | INTRAMUSCULAR | 0 refills | Status: AC
  Filled 2024-04-16: qty 0.5, 1d supply, fill #0

## 2024-04-16 MED ORDER — AREXVY 120 MCG/0.5ML IM SUSR
INTRAMUSCULAR | 0 refills | Status: AC
  Filled 2024-04-16: qty 1, 1d supply, fill #0

## 2024-04-16 MED ORDER — CHOLESTYRAMINE 4 G PO PACK: 4.0000 g | PACK | Freq: Three times a day (TID) | ORAL | 5 refills | Status: AC | PRN

## 2024-04-16 NOTE — Patient Instructions (Addendum)
 Annual covid and flu vaccines Arexvy, RSV, Respiratory Syncitial Virus Vaccine Prevnar 20 pneumonia booster Tetanus booster is due  All [facility] pharmacies are now walk in vaccine clinics M-F 9-4   Preventive Care 65 Years and Older, Female Preventive care refers to lifestyle choices and visits with your health care provider that can promote health and wellness. Preventive care visits are also called wellness exams. What can I expect for my preventive care visit? Counseling Your health care provider may ask you questions about your: Medical history, including: Past medical problems. Family medical history. Pregnancy and menstrual history. History of falls. Current health, including: Memory and ability to understand (cognition). Emotional well-being. Home life and relationship well-being. Sexual activity and sexual health. Lifestyle, including: Alcohol, nicotine or tobacco, and drug use. Access to firearms. Diet, exercise, and sleep habits. Work and work astronomer. Sunscreen use. Safety issues such as seatbelt and bike helmet use. Physical exam Your health care provider will check your: Height and weight. These may be used to calculate your BMI (body mass index). BMI is a measurement that tells if you are at a healthy weight. Waist circumference. This measures the distance around your waistline. This measurement also tells if you are at a healthy weight and may help predict your risk of certain diseases, such as type 2 diabetes and high blood pressure. Heart rate and blood pressure. Body temperature. Skin for abnormal spots. What immunizations do I need?  Vaccines are usually given at various ages, according to a schedule. Your health care provider will recommend vaccines for you based on your age, medical history, and lifestyle or other factors, such as travel or where you work. What tests do I need? Screening Your health care provider may recommend screening tests for certain  conditions. This may include: Lipid and cholesterol levels. Hepatitis C test. Hepatitis B test. HIV (human immunodeficiency virus) test. STI (sexually transmitted infection) testing, if you are at risk. Lung cancer screening. Colorectal cancer screening. Diabetes screening. This is done by checking your blood sugar (glucose) after you have not eaten for a while (fasting). Mammogram. Talk with your health care provider about how often you should have regular mammograms. BRCA-related cancer screening. This may be done if you have a family history of breast, ovarian, tubal, or peritoneal cancers. Bone density scan. This is done to screen for osteoporosis. Talk with your health care provider about your test results, treatment options, and if necessary, the need for more tests. Follow these instructions at home: Eating and drinking  Eat a diet that includes fresh fruits and vegetables, whole grains, lean protein, and low-fat dairy products. Limit your intake of foods with high amounts of sugar, saturated fats, and salt. Take vitamin and mineral supplements as recommended by your health care provider. Do not drink alcohol if your health care provider tells you not to drink. If you drink alcohol: Limit how much you have to 0-1 drink a day. Know how much alcohol is in your drink. In the [place] one drink equals one 12 oz bottle of beer (355 mL), one 5 oz glass of wine (148 mL), or one 1 oz glass of hard liquor (44 mL). Lifestyle Brush your teeth every morning and night with fluoride toothpaste. Floss one time each day. Exercise for at least 30 minutes 5 or more days each week. Do not use any products that contain nicotine or tobacco. These products include cigarettes, chewing tobacco, and vaping devices, such as e-cigarettes. If you need help quitting, ask your health  care provider. Do not use drugs. If you are sexually active, practice safe sex. Use a condom or other form of protection in order to  prevent STIs. Take aspirin only as told by your health care provider. Make sure that you understand how much to take and what form to take. Work with your health care provider to find out whether it is safe and beneficial for you to take aspirin daily. Ask your health care provider if you need to take a cholesterol-lowering medicine (statin). Find healthy ways to manage stress, such as: Meditation, yoga, or listening to music. Journaling. Talking to a trusted person. Spending time with friends and family. Minimize exposure to UV radiation to reduce your risk of skin cancer. Safety Always wear your seat belt while driving or riding in a vehicle. Do not drive: If you have been drinking alcohol. Do not ride with someone who has been drinking. When you are tired or distracted. While texting. If you have been using any mind-altering substances or drugs. Wear a helmet and other protective equipment during sports activities. If you have firearms in your house, make sure you follow all gun safety procedures. What's next? Visit your health care provider once a year for an annual wellness visit. Ask your health care provider how often you should have your eyes and teeth checked. Stay up to date on all vaccines. This information is not intended to replace advice given to you by your health care provider. Make sure you discuss any questions you have with your health care provider. Document Revised: 12/03/2020 Document Reviewed: 12/03/2020 Elsevier Patient Education  2024 [employer].

## 2024-04-16 NOTE — Progress Notes (Signed)
 Subjective:    Patient ID: Jasmine Wilson, female    DOB: Jan 13, 1941, 83 y.o.   MRN: [id number]  No chief complaint on file.   HPI Discussed the use of AI scribe software for clinical note transcription with the patient, who gave verbal consent to proceed.  History of Present Illness Jasmine Wilson is an 83 year old female who presents with chronic diarrhea following gallbladder removal.  She has been experiencing chronic diarrhea for three to four months following her gallbladder removal. The diarrhea typically begins at six o'clock in the morning and lasts for several hours. Initially, she managed the symptoms with Imodium but was reluctant to rely on it long-term. She was prescribed cholestyramine , which she takes once a day before her evening meal, and it has helped regulate her bowel movements, preventing them from being too loose or too hard. She also uses a stool softener as needed. She avoids fatty foods, which exacerbate her symptoms, and has eliminated peanut butter from her diet after a severe reaction.  She was feeling anxious and weak due to the unpredictability of her bowel movements, which previously made her hesitant to leave the house but now with the Cholestyramine  she feels better once more. No blood in her stool or black, sticky stools. No one else in her family has experienced similar symptoms.  Her son-in-law, Jasmine Wilson, is supportive and helps her manage her medication costs, which have become a concern due to high prices at pharmacies.  She has a history of a kidney stone, which is being monitored by her urologist. The stone has grown slightly but is not currently causing symptoms.  She engages in physical activity through [employer] and is cautious about her balance due to her age. No c/o CP/palp/SOB/HA/congestion/fevers/GI or GU c/o. 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   Glaucoma 01/29/2011   Hearing aid worn    History of pneumonia    HTN (hypertension) 01/29/2011   Hyperlipidemia    Measles as a child   Meniere's disease    Pedal edema 04/01/2016   Preventative health care 01/29/2011   Shingles    Vaginosis 01/29/2013    Past Surgical History:  Procedure Laterality Date   BREAST BIOPSY Left    CHOLECYSTECTOMY N/A 08/21/2022   Procedure: LAPAROSCOPIC CHOLECYSTECTOMY;  Surgeon: Belinda Cough, MD;  Location: [facility];  Service: General;  Laterality: N/A;   lump removed on left breast     clogged milk duct, benign   TUBAL LIGATION      Family History  Problem Relation Age of Onset   Breast cancer Mother    Stroke Father    Heart disease Father    Hyperlipidemia Father    Hypertension Father    Breast cancer Sister 60   Uterine cancer Maternal Grandmother        uterus    Social History   Socioeconomic History   Marital status: Widowed    Spouse name: Not on file   Number of children: 3   Years of education: Not on file   Highest education level: Not on file  Occupational History   Occupation: Retired  Tobacco Use   Smoking status:  Former    Current packs/day: 0.00    Types: Cigarettes    Quit date: 06/21/1970    Years since quitting: 53.8   Smokeless tobacco: Never   Tobacco comments:    occasional smoker  Vaping Use   Vaping status: Never Used  Substance and Sexual Activity   Alcohol use: No   Drug use: No   Sexual activity: Never  Other Topics Concern   Not on file  Social History Narrative   Not on file   Social Drivers of Health   Financial Resource Strain: Low Risk  (05/31/2023)   Overall Financial Resource Strain (CARDIA)    Difficulty of Paying Living Expenses: Not hard at all  Food Insecurity: No Food Insecurity (05/31/2023)    Hunger Vital Sign    Worried About Running Out of Food in the Last Year: Never true    Ran Out of Food in the Last Year: Never true  Transportation Needs: No Transportation Needs (05/31/2023)   PRAPARE - Administrator, Civil Service (Medical): No    Lack of Transportation (Non-Medical): No  Physical Activity: Insufficiently Active (05/31/2023)   Exercise Vital Sign    Days of Exercise per Week: 1 day    Minutes of Exercise per Session: 60 min  Stress: No Stress Concern Present (05/31/2023)   [employer] of Occupational Health - Occupational Stress Questionnaire    Feeling of Stress : Not at all  Social Connections: Moderately Integrated (05/31/2023)   Social Connection and Isolation Panel    Frequency of Communication with Friends and Family: More than three times a week    Frequency of Social Gatherings with Friends and Family: More than three times a week    Attends Religious Services: More than 4 times per year    Active Member of [employer] or Organizations: Yes    Attends Banker Meetings: More than 4 times per year    Marital Status: Widowed  Intimate Partner Violence: Not At Risk (05/31/2023)   Humiliation, Afraid, Rape, and Kick questionnaire    Fear of Current or Ex-Partner: No    Emotionally Abused: No    Physically Abused: No    Sexually Abused: No    Outpatient Medications Prior to Visit  Medication Sig Dispense Refill   atorvastatin  (LIPITOR) 20 MG tablet TAKE 1 TABLET BY MOUTH DAILY 30 tablet 11   bimatoprost (LUMIGAN) 0.01 % SOLN Place 1 drop into both eyes at bedtime.     brimonidine  (ALPHAGAN ) 0.2 % ophthalmic solution Place 1 drop into the left eye every 12 (twelve) hours.     dorzolamide -timolol  (COSOPT ) 2-0.5 % ophthalmic solution Place 1 drop into both eyes every 12 (twelve) hours.     metoprolol  succinate (TOPROL -XL) 100 MG 24 hr tablet TAKE 1 TABLET BY MOUTH DAILY 30 tablet 11   timolol  (BETIMOL ) 0.5 % ophthalmic solution Place 1  drop into both eyes 2 (two) times daily.     cholestyramine  (QUESTRAN ) 4 g packet Take 1 packet (4 g total) by mouth 4 (four) times daily.     No facility-administered medications prior to visit.    Allergies  Allergen Reactions   Silicone Other (See Comments)    Blisters the skin- patient PREFERS [place] WRAP   Tape Other (See Comments)    Blisters the skin- patient PREFERS COBAN WRAP   Codeine Other (See Comments)    Made the patient feel funny   Oxycodone Nausea And Vomiting    Review of Systems  Constitutional:  Negative for chills, fever and malaise/fatigue.  HENT:  Negative for congestion and hearing loss.   Eyes:  Negative for discharge.  Respiratory:  Negative for cough, sputum production and shortness of breath.   Cardiovascular:  Negative for chest pain, palpitations and leg swelling.  Gastrointestinal:  Positive for constipation and diarrhea. Negative for abdominal pain, blood in stool, heartburn, nausea and vomiting.  Genitourinary:  Negative for dysuria, frequency, hematuria and urgency.  Musculoskeletal:  Negative for back pain, falls and myalgias.  Skin:  Negative for rash.  Neurological:  Positive for dizziness. Negative for sensory change, loss of consciousness, weakness and headaches.  Endo/Heme/Allergies:  Negative for environmental allergies. Does not bruise/bleed easily.  Psychiatric/Behavioral:  Negative for depression and suicidal ideas. The patient is not nervous/anxious and does not have insomnia.        Objective:    Physical Exam Constitutional:      General: She is not in acute distress.    Appearance: Normal appearance. She is not diaphoretic.  HENT:     Head: Normocephalic and atraumatic.     Right Ear: Tympanic membrane, ear canal and external ear normal.     Left Ear: Tympanic membrane, ear canal and external ear normal.     Nose: Nose normal.     Mouth/Throat:     Mouth: Mucous membranes are moist.     Pharynx: Oropharynx is clear. No  oropharyngeal exudate.  Eyes:     General: No scleral icterus.       Right eye: No discharge.        Left eye: No discharge.     Conjunctiva/sclera: Conjunctivae normal.     Pupils: Pupils are equal, round, and reactive to light.  Neck:     Thyroid : No thyromegaly.  Cardiovascular:     Rate and Rhythm: Normal rate and regular rhythm.     Heart sounds: Normal heart sounds. No murmur heard. Pulmonary:     Effort: Pulmonary effort is normal. No respiratory distress.     Breath sounds: Normal breath sounds. No wheezing or rales.  Abdominal:     General: Bowel sounds are normal. There is no distension.     Palpations: Abdomen is soft. There is no mass.     Tenderness: There is no abdominal tenderness.  Musculoskeletal:        General: No tenderness. Normal range of motion.     Cervical back: Normal range of motion and neck supple.  Lymphadenopathy:     Cervical: No cervical adenopathy.  Skin:    General: Skin is warm and dry.     Findings: No rash.  Neurological:     General: No focal deficit present.     Mental Status: She is alert and oriented to person, place, and time.     Cranial Nerves: No cranial nerve deficit.     Coordination: Coordination normal.     Deep Tendon Reflexes: Reflexes are normal and symmetric. Reflexes normal.  Psychiatric:        Mood and Affect: Mood normal.        Behavior: Behavior normal.        Thought Content: Thought content normal.        Judgment: Judgment normal.     BP 138/72   Pulse 80   Temp (!) 97.5 F (36.4 C)   Ht 5' 8 (1.727 m)   Wt 182 lb 9.6 oz (82.8 kg)   SpO2 99%   BMI 27.76 kg/m  Wt Readings from Last 3 Encounters:  04/16/24 182 lb 9.6 oz (82.8 kg)  02/23/24 182 lb 3.2 oz (82.6 kg)  05/31/23 175 lb (79.4 kg)    Diabetic Foot Exam - Simple   No data filed    Lab Results  Component Value Date   WBC 7.8 02/23/2024   HGB 15.1 (H) 02/23/2024   HCT 46.1 (H) 02/23/2024   PLT 243.0 02/23/2024   GLUCOSE 112 (H)  02/23/2024   CHOL 233 (H) 09/21/2022   TRIG 305.0 (H) 09/21/2022   HDL 52.30 09/21/2022   LDLDIRECT 113.0 09/21/2022   LDLCALC 68 12/26/2019   ALT 12 02/23/2024   AST 18 02/23/2024   NA 141 02/23/2024   K 4.7 02/23/2024   CL 104 02/23/2024   CREATININE 0.78 02/23/2024   BUN 15 02/23/2024   CO2 26 02/23/2024   TSH 1.15 09/21/2022   HGBA1C 6.0 09/16/2021    Lab Results  Component Value Date   TSH 1.15 09/21/2022   Lab Results  Component Value Date   WBC 7.8 02/23/2024   HGB 15.1 (H) 02/23/2024   HCT 46.1 (H) 02/23/2024   MCV 92.2 02/23/2024   PLT 243.0 02/23/2024   Lab Results  Component Value Date   NA 141 02/23/2024   K 4.7 02/23/2024   CO2 26 02/23/2024   GLUCOSE 112 (H) 02/23/2024   BUN 15 02/23/2024   CREATININE 0.78 02/23/2024   BILITOT 0.6 02/23/2024   ALKPHOS 79 02/23/2024   AST 18 02/23/2024   ALT 12 02/23/2024   PROT 6.4 02/23/2024   ALBUMIN 4.3 02/23/2024   CALCIUM  8.9 02/23/2024   ANIONGAP 9 08/20/2022   GFR 70.44 02/23/2024   Lab Results  Component Value Date   CHOL 233 (H) 09/21/2022   Lab Results  Component Value Date   HDL 52.30 09/21/2022   Lab Results  Component Value Date   LDLCALC 68 12/26/2019   Lab Results  Component Value Date   TRIG 305.0 (H) 09/21/2022   Lab Results  Component Value Date   CHOLHDL 4 09/21/2022   Lab Results  Component Value Date   HGBA1C 6.0 09/16/2021       Assessment & Plan:  Primary hypertension Assessment & Plan: Well controlled, no changes to meds. Encouraged heart healthy diet such as the DASH diet and exercise as tolerated.    Orders: -     Comprehensive metabolic panel with GFR -     CBC with Differential/Platelet -     TSH  Mixed hyperlipidemia Assessment & Plan: Encourage heart healthy diet such as MIND or DASH diet, increase exercise, avoid trans fats, simple carbohydrates and processed foods, consider a krill or fish or flaxseed oil cap daily.    Orders: -     Lipid  panel  Preventative health care Assessment & Plan: Patient encouraged to maintain heart healthy diet, regular exercise, adequate sleep. Consider daily probiotics. Take medications as prescribed. Labs ordered and reviewed. Given and reviewed copy of ACP documents from [employer] and encouraged to complete and return.  Colonoscopy has aged out MM aged out Pap aged out Dexa declines imaging   Hyperglycemia Assessment & Plan: minimize simple carbs. Increase exercise as tolerated.   Orders: -     Hemoglobin A1c  Diarrhea, unspecified type -     Cholestyramine ; Take 1 packet (4 g total) by mouth 3 (three) times daily as needed.  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 - Continue cholestyramine  once daily before the evening meal. - Avoid high-fat foods to manage symptoms. - Update prescription to cholestyramine  three times a day as needed, with a refill on hold at the pharmacy.  Hyperglycemia, unspecified Slightly elevated blood glucose levels. - Check A1c to assess long-term glucose control.  Essential hypertension Blood pressure well-controlled with current medication regimen. - Continue current antihypertensive medication regimen.  Mixed hyperlipidemia  Kidney stone under surveillance Kidney stone growing slightly but asymptomatic, under surveillance by urology with annual follow-up. - Continue annual follow-up with urology to monitor kidney stone. - Watch for symptoms such as flank pain, fever, or hematuria.  Adult Wellness Visit Routine wellness visit with no acute issues identified during the physical examination. - Perform basic lab panel including thyroid  function and A1c to assess for any underlying issues. - Continue current  exercise regimen, including Silver Sneakers program for strength and balance. - Ensure adequate calcium  and vitamin D intake.  Recording duration: 48 minutes     Harlene Horton, MD

## 2024-06-05 ENCOUNTER — Ambulatory Visit: Payer: Medicare Other

## 2024-06-05 VITALS — BP 120/60 | HR 77 | Temp 97.6°F | Ht 68.0 in | Wt 181.0 lb

## 2024-06-05 DIAGNOSIS — Z0000 Encounter for general adult medical examination without abnormal findings: Secondary | ICD-10-CM

## 2024-06-05 NOTE — Patient Instructions (Addendum)
 Jasmine Wilson,  Thank you for taking the time for your Medicare Wellness Visit. I appreciate your continued commitment to your health goals. Please review the care plan we discussed, and feel free to reach out if I can assist you further.  Please note that Annual Wellness Visits do not include a physical exam. Some assessments may be limited, especially if the visit was conducted virtually. If needed, we may recommend an in-person follow-up with your provider.  Ongoing Care Seeing your primary care provider every 3 to 6 months helps us  monitor your health and provide consistent, personalized care.   Referrals If a referral was made during today's visit and you haven't received any updates within two weeks, please contact the referred provider directly to check on the status.  Recommended Screenings:  Health Maintenance  Topic Date Due   Zoster (Shingles) Vaccine (1 of 2) Never done   Osteoporosis screening with Bone Density Scan  Never done   DTaP/Tdap/Td vaccine (2 - Td or Tdap) 02/23/2022   COVID-19 Vaccine (4 - 2025-26 season) 02/20/2024   Flu Shot  09/18/2024*   Medicare Annual Wellness Visit  06/05/2025   Pneumococcal Vaccine for age over 68  Completed   Meningitis B Vaccine  Aged Out  *Topic was postponed. The date shown is not the original due date.       06/05/2024   11:52 AM  Advanced Directives  Does Patient Have a Medical Advance Directive? Yes  Type of Estate Agent of [place];Living will  Does patient want to make changes to medical advance directive? No - Patient declined  Copy of Healthcare Power of Attorney in Chart? Yes - validated most recent copy scanned in chart (See row information)    Vision: Annual vision screenings are recommended for early detection of glaucoma, cataracts, and diabetic retinopathy. These exams can also reveal signs of chronic conditions such as diabetes and high blood pressure.  Dental: Annual dental screenings help  detect early signs of oral cancer, gum disease, and other conditions linked to overall health, including heart disease and diabetes.  Please see the attached documents for additional preventive care recommendations.

## 2024-06-05 NOTE — Progress Notes (Signed)
 Chief Complaint  Patient presents with   Medicare Wellness     Subjective:   Jasmine Wilson is a 83 y.o. female who presents for a Medicare Annual Wellness Visit.  Visit info / Clinical Intake: Medicare Wellness Visit Type:: Subsequent Annual Wellness Visit Persons participating in visit and providing information:: patient Medicare Wellness Visit Mode:: In-person (required for WTM) Interpreter Needed?: No Pre-visit prep was completed: yes AWV questionnaire completed by patient prior to visit?: no Living arrangements:: (!) lives alone Patient's Overall Health Status Rating: good Typical amount of pain: none Does pain affect daily life?: no Are you currently prescribed opioids?: no  Dietary Habits and Nutritional Risks How many meals a day?: 2 Eats fruit and vegetables daily?: (!) no Most meals are obtained by: preparing own meals In the last 2 weeks, have you had any of the following?: none Diabetic:: no  Functional Status Activities of Daily Living (to include ambulation/medication): Independent Ambulation: Independent with device- listed below Home Assistive Devices/Equipment: Eyeglasses; Other (Comment) (Hearing Aids) Medication Administration: Independent Home Management (perform basic housework or laundry): Independent Manage your own finances?: yes Primary transportation is: driving Concerns about vision?: no *vision screening is required for WTM* Concerns about hearing?: (!) yes Uses hearing aids?: (!) yes Hear whispered voice?: yes  Fall Screening Falls in the past year?: 0 Number of falls in past year: 0 Was there an injury with Fall?: 0 Fall Risk Category Calculator: 0 Patient Fall Risk Level: Low Fall Risk  Fall Risk Patient at Risk for Falls Due to: No Fall Risks Fall risk Follow up: Falls evaluation completed  Home and Transportation Safety: All rugs have non-skid backing?: yes All stairs or steps have railings?: N/A, no stairs Grab bars in the  bathtub or shower?: yes Have non-skid surface in bathtub or shower?: yes Good home lighting?: yes Regular seat belt use?: yes Hospital stays in the last year:: no  Cognitive Assessment Difficulty concentrating, remembering, or making decisions? : no Will 6CIT or Mini Cog be Completed: yes What year is it?: 0 points What month is it?: 0 points Give patient an address phrase to remember (5 components): [address] [place]  About what time is it?: 0 points Count backwards from 20 to 1: 0 points Say the months of the year in reverse: 0 points Repeat the address phrase from earlier: 0 points 6 CIT Score: 0 points  Advance Directives (For Healthcare) Does Patient Have a Medical Advance Directive?: Yes Does patient want to make changes to medical advance directive?: No - Patient declined Type of Advance Directive: Healthcare Power of [place]; Living will Copy of Healthcare Power of Attorney in Chart?: Yes - validated most recent copy scanned in chart (See row information) Copy of Living Will in Chart?: Yes - validated most recent copy scanned in chart (See row information)  Reviewed/Updated  Reviewed/Updated: Reviewed All (Medical, Surgical, Family, Medications, Allergies, Care Teams, Patient Goals)    Allergies (verified) Silicone, Tape, Codeine, and Oxycodone   Current Medications (verified) Outpatient Encounter Medications as of 06/05/2024  Medication Sig   atorvastatin  (LIPITOR) 20 MG tablet TAKE 1 TABLET BY MOUTH DAILY   bimatoprost (LUMIGAN) 0.01 % SOLN Place 1 drop into both eyes at bedtime.   brimonidine  (ALPHAGAN ) 0.2 % ophthalmic solution Place 1 drop into the left eye every 12 (twelve) hours.   cholestyramine  (QUESTRAN ) 4 g packet Take 1 packet (4 g total) by mouth 3 (three) times daily as needed.   dorzolamide -timolol  (COSOPT ) 2-0.5 % ophthalmic  solution Place 1 drop into both eyes every 12 (twelve) hours.   metoprolol  succinate (TOPROL -XL) 100 MG 24 hr tablet  TAKE 1 TABLET BY MOUTH DAILY   pneumococcal 20-valent conjugate vaccine (PREVNAR 20 ) 0.5 ML injection Inject into the muscle.   RSV vaccine recomb adjuvanted (AREXVY ) 120 MCG/0.5ML injection Inject into the muscle as directed.   timolol  (BETIMOL ) 0.5 % ophthalmic solution Place 1 drop into both eyes 2 (two) times daily.   No facility-administered encounter medications on file as of 06/05/2024.    History: Past Medical History:  Diagnosis Date   Abdominal pain 03/02/2011   Arthritis 03/02/2012   Bronchitis 04/04/2011   Cataract    Chicken pox as a child   Cough 01/29/2013   Female bladder prolapse, acquired 01/29/2011   Glaucoma 01/29/2011   Hearing aid worn    History of pneumonia    HTN (hypertension) 01/29/2011   Hyperlipidemia    Measles as a child   Meniere's disease    Pedal edema 04/01/2016   Preventative health care 01/29/2011   Shingles    Vaginosis 01/29/2013   Past Surgical History:  Procedure Laterality Date   BREAST BIOPSY Left    CHOLECYSTECTOMY N/A 08/21/2022   Procedure: LAPAROSCOPIC CHOLECYSTECTOMY;  Surgeon: Belinda Cough, MD;  Location: [facility];  Service: General;  Laterality: N/A;   lump removed on left breast     clogged milk duct, benign   TUBAL LIGATION     Family History  Problem Relation Age of Onset   Breast cancer Mother    Stroke Father    Heart disease Father    Hyperlipidemia Father    Hypertension Father    Breast cancer Sister 19   Uterine cancer Maternal Grandmother        uterus   Social History   Occupational History   Occupation: Retired  Tobacco Use   Smoking status: Former    Current packs/day: 0.00    Types: Cigarettes    Quit date: 06/21/1970    Years since quitting: 53.9   Smokeless tobacco: Never   Tobacco comments:    occasional smoker  Vaping Use   Vaping status: Never Used  Substance and Sexual Activity   Alcohol use: No   Drug use: No   Sexual activity: Never   Tobacco Counseling Counseling given:  No Tobacco comments: occasional smoker  SDOH Screenings   Food Insecurity: No Food Insecurity (06/05/2024)  Housing: Unknown (06/05/2024)  Transportation Needs: No Transportation Needs (06/05/2024)  Utilities: Not At Risk (06/05/2024)  Alcohol Screen: Low Risk (05/31/2023)  Depression (PHQ2-9): Low Risk (06/05/2024)  Financial Resource Strain: Low Risk (05/31/2023)  Physical Activity: Inactive (06/05/2024)  Social Connections: Moderately Integrated (06/05/2024)  Stress: No Stress Concern Present (06/05/2024)  Tobacco Use: Medium Risk (06/05/2024)  Health Literacy: Adequate Health Literacy (06/05/2024)   See flowsheets for full screening details  Depression Screen PHQ 2 & 9 Depression Scale- Over the past 2 weeks, how often have you been bothered by any of the following problems? Little interest or pleasure in doing things: 0 Feeling down, depressed, or hopeless (PHQ Adolescent also includes...irritable): 0 PHQ-2 Total Score: 0     Goals Addressed               This Visit's Progress     Stay healthy (pt-stated)        Stay active for my grandson.             Objective:    Today's Vitals  06/05/24 1144  BP: 120/60  Pulse: 77  Temp: 97.6 F (36.4 C)  TempSrc: Oral  SpO2: 96%  Weight: 181 lb (82.1 kg)  Height: 5' 8 (1.727 m)   Body mass index is 27.52 kg/m.  Hearing/Vision screen Hearing Screening - Comments:: Wears Hearing Aids Vision Screening - Comments:: Wears rx glasses - up to date with routine eye exams with  Dr Thom Hamilton Immunizations and Health Maintenance Health Maintenance  Topic Date Due   Zoster Vaccines- Shingrix (1 of 2) Never done   Bone Density Scan  Never done   DTaP/Tdap/Td (2 - Td or Tdap) 02/23/2022   COVID-19 Vaccine (4 - 2025-26 season) 02/20/2024   Influenza Vaccine  09/18/2024 (Originally 01/20/2024)   Medicare Annual Wellness (AWV)  06/05/2025   Pneumococcal Vaccine: 50+ Years  Completed   Meningococcal B Vaccine  Aged Out         Assessment/Plan:  This is a routine wellness examination for [facility].  Patient Care Team: Domenica Harlene LABOR, MD as PCP - General (Family Medicine) Hamilton Thom, OD as Referring Physician (Optometry)  I have personally reviewed and noted the following in the patients chart:   Medical and social history Use of alcohol, tobacco or illicit drugs  Current medications and supplements including opioid prescriptions. Functional ability and status Nutritional status Physical activity Advanced directives List of other physicians Hospitalizations, surgeries, and ER visits in previous 12 months Vitals Screenings to include cognitive, depression, and falls Referrals and appointments  No orders of the defined types were placed in this encounter.  In addition, I have reviewed and discussed with patient certain preventive protocols, quality metrics, and best practice recommendations. A written personalized care plan for preventive services as well as general preventive health recommendations were provided to patient.   Rojelio LELON Blush, LPN   87/83/7974   Return in 53 weeks (on 06/11/2025).  After Visit Summary: (In Person-Printed) AVS printed and given to the patient  Nurse Notes: HM Addressed: Patient deferred vaccines and declined  Bone Density Scan.

## 2024-07-11 ENCOUNTER — Telehealth: Payer: Self-pay | Admitting: Family Medicine

## 2024-07-11 DIAGNOSIS — N200 Calculus of kidney: Secondary | ICD-10-CM

## 2024-07-11 NOTE — Telephone Encounter (Signed)
 Copied from CRM #[id number]. Topic: Referral - Request for Referral >> Jul 11, 2024  2:17 PM Wess RAMAN wrote: Did the patient discuss referral with their provider in the last year? Yes (If No - schedule appointment) (If Yes - send message)  Appointment offered? No  Type of order/referral and detailed reason for visit: Dermatology  Preference of office, provider, location:  [facility] [address] [place], [place], [place] [address] Phone: [phone] Fax: [phone] (referral coordinator fax) Fax: [phone] (Clinical fax  If referral order, have you been seen by this specialty before? Yes (If Yes, this issue or another issue? When? Where?  Can we respond through MyChart? No

## 2024-07-11 NOTE — Telephone Encounter (Signed)
 Copied from CRM #[id number]. Topic: Referral - Request for Referral >> Jul 11, 2024  2:26 PM Wess RAMAN wrote: Did the patient discuss referral with their provider in the last year? Yes (If No - schedule appointment) (If Yes - send message)  Appointment offered? No  Type of order/referral and detailed reason for visit: Urology   Preference of office, provider, location:  Dr. Nieves [facility] [address] [place], [place], [place] [address] Phone: [phone] Fax: [phone]   If referral order, have you been seen by this specialty before? Yes (If Yes, this issue or another issue? When? Where?  Can we respond through MyChart? No

## 2024-07-11 NOTE — Telephone Encounter (Signed)
 Referral placed

## 2024-10-15 ENCOUNTER — Encounter: Admitting: Sports Medicine

## 2024-10-15 ENCOUNTER — Ambulatory Visit: Admitting: Family Medicine

## 2025-06-11 ENCOUNTER — Ambulatory Visit
